# Patient Record
Sex: Female | Born: 1955 | Race: White | Hispanic: No | Smoking: Former smoker
Health system: Southern US, Community
[De-identification: ages and names within clinical notes are randomized; demographics above are authoritative.]

## PROBLEM LIST (undated history)

## (undated) DIAGNOSIS — I10 Essential (primary) hypertension: Secondary | ICD-10-CM

## (undated) DIAGNOSIS — Z9889 Other specified postprocedural states: Secondary | ICD-10-CM

## (undated) DIAGNOSIS — E119 Type 2 diabetes mellitus without complications: Secondary | ICD-10-CM

## (undated) DIAGNOSIS — J069 Acute upper respiratory infection, unspecified: Secondary | ICD-10-CM

## (undated) DIAGNOSIS — Z87442 Personal history of urinary calculi: Secondary | ICD-10-CM

## (undated) DIAGNOSIS — Z952 Presence of prosthetic heart valve: Secondary | ICD-10-CM

## (undated) DIAGNOSIS — R112 Nausea with vomiting, unspecified: Secondary | ICD-10-CM

## (undated) DIAGNOSIS — Z8774 Personal history of (corrected) congenital malformations of heart and circulatory system: Secondary | ICD-10-CM

## (undated) DIAGNOSIS — Z8719 Personal history of other diseases of the digestive system: Secondary | ICD-10-CM

## (undated) DIAGNOSIS — H409 Unspecified glaucoma: Secondary | ICD-10-CM

## (undated) DIAGNOSIS — I352 Nonrheumatic aortic (valve) stenosis with insufficiency: Secondary | ICD-10-CM

## (undated) DIAGNOSIS — I5032 Chronic diastolic (congestive) heart failure: Secondary | ICD-10-CM

## (undated) HISTORY — DX: Essential (primary) hypertension: I10

## (undated) HISTORY — DX: Other specified postprocedural states: Z98.890

## (undated) HISTORY — DX: Type 2 diabetes mellitus without complications: E11.9

## (undated) HISTORY — PX: CARDIAC CATHETERIZATION: SHX172

---

## 2003-11-02 DIAGNOSIS — Q231 Congenital insufficiency of aortic valve: Secondary | ICD-10-CM

## 2003-11-02 DIAGNOSIS — Q2381 Bicuspid aortic valve: Secondary | ICD-10-CM

## 2003-11-02 HISTORY — DX: Congenital insufficiency of aortic valve: Q23.1

## 2003-11-02 HISTORY — DX: Bicuspid aortic valve: Q23.81

## 2004-09-04 ENCOUNTER — Ambulatory Visit: Payer: Self-pay | Admitting: Cardiology

## 2004-09-09 ENCOUNTER — Ambulatory Visit (HOSPITAL_COMMUNITY): Admission: RE | Admit: 2004-09-09 | Discharge: 2004-09-09 | Payer: Self-pay | Admitting: Orthopedic Surgery

## 2004-09-09 ENCOUNTER — Ambulatory Visit: Payer: Self-pay | Admitting: Cardiology

## 2004-09-28 ENCOUNTER — Ambulatory Visit: Payer: Self-pay | Admitting: Cardiology

## 2009-10-11 ENCOUNTER — Encounter: Payer: Self-pay | Admitting: Cardiology

## 2012-01-07 ENCOUNTER — Other Ambulatory Visit: Payer: Self-pay | Admitting: Physician Assistant

## 2012-01-07 ENCOUNTER — Inpatient Hospital Stay (HOSPITAL_COMMUNITY)
Admission: AD | Admit: 2012-01-07 | Discharge: 2012-01-11 | DRG: 287 | Disposition: A | Discharge status: Home / Self Care | Payer: PRIVATE HEALTH INSURANCE | Source: Other Acute Inpatient Hospital | Attending: Cardiovascular Disease | Admitting: Cardiovascular Disease

## 2012-01-07 ENCOUNTER — Encounter (HOSPITAL_COMMUNITY)
Admission: AD | Disposition: A | Discharge status: Home / Self Care | Payer: Self-pay | Source: Other Acute Inpatient Hospital | Attending: Cardiovascular Disease

## 2012-01-07 ENCOUNTER — Encounter (HOSPITAL_COMMUNITY): Payer: Self-pay | Admitting: General Practice

## 2012-01-07 ENCOUNTER — Ambulatory Visit (HOSPITAL_COMMUNITY)
Admission: AD | Admit: 2012-01-07 | Payer: PRIVATE HEALTH INSURANCE | Source: Other Acute Inpatient Hospital | Admitting: Cardiovascular Disease

## 2012-01-07 DIAGNOSIS — Z888 Allergy status to other drugs, medicaments and biological substances status: Secondary | ICD-10-CM

## 2012-01-07 DIAGNOSIS — I359 Nonrheumatic aortic valve disorder, unspecified: Principal | ICD-10-CM

## 2012-01-07 DIAGNOSIS — Z79899 Other long term (current) drug therapy: Secondary | ICD-10-CM

## 2012-01-07 DIAGNOSIS — I5032 Chronic diastolic (congestive) heart failure: Secondary | ICD-10-CM | POA: Diagnosis present

## 2012-01-07 DIAGNOSIS — I509 Heart failure, unspecified: Secondary | ICD-10-CM | POA: Diagnosis present

## 2012-01-07 DIAGNOSIS — Q2111 Secundum atrial septal defect: Secondary | ICD-10-CM

## 2012-01-07 DIAGNOSIS — K0530 Chronic periodontitis, unspecified: Secondary | ICD-10-CM | POA: Diagnosis present

## 2012-01-07 DIAGNOSIS — I350 Nonrheumatic aortic (valve) stenosis: Secondary | ICD-10-CM

## 2012-01-07 DIAGNOSIS — IMO0002 Reserved for concepts with insufficient information to code with codable children: Secondary | ICD-10-CM | POA: Diagnosis present

## 2012-01-07 DIAGNOSIS — Z91010 Allergy to peanuts: Secondary | ICD-10-CM

## 2012-01-07 DIAGNOSIS — I517 Cardiomegaly: Secondary | ICD-10-CM | POA: Diagnosis present

## 2012-01-07 DIAGNOSIS — I10 Essential (primary) hypertension: Secondary | ICD-10-CM

## 2012-01-07 DIAGNOSIS — Z7982 Long term (current) use of aspirin: Secondary | ICD-10-CM

## 2012-01-07 DIAGNOSIS — F17200 Nicotine dependence, unspecified, uncomplicated: Secondary | ICD-10-CM | POA: Diagnosis present

## 2012-01-07 DIAGNOSIS — R079 Chest pain, unspecified: Secondary | ICD-10-CM

## 2012-01-07 DIAGNOSIS — Q211 Atrial septal defect: Secondary | ICD-10-CM

## 2012-01-07 DIAGNOSIS — Z720 Tobacco use: Secondary | ICD-10-CM

## 2012-01-07 HISTORY — DX: Personal history of other diseases of the digestive system: Z87.19

## 2012-01-07 HISTORY — DX: Chronic diastolic (congestive) heart failure: I50.32

## 2012-01-07 HISTORY — DX: Acute upper respiratory infection, unspecified: J06.9

## 2012-01-07 HISTORY — PX: LEFT AND RIGHT HEART CATHETERIZATION WITH CORONARY ANGIOGRAM: SHX5449

## 2012-01-07 HISTORY — DX: Nonrheumatic aortic (valve) stenosis with insufficiency: I35.2

## 2012-01-07 LAB — POCT I-STAT 3, VENOUS BLOOD GAS (G3P V)
Acid-base deficit: 1 mmol/L (ref 0.0–2.0)
pH, Ven: 7.345 — ABNORMAL HIGH (ref 7.250–7.300)

## 2012-01-07 LAB — POCT I-STAT 3, ART BLOOD GAS (G3+)
Acid-base deficit: 1 mmol/L (ref 0.0–2.0)
O2 Saturation: 97 %
TCO2: 25 mmol/L (ref 0–100)

## 2012-01-07 LAB — POCT ACTIVATED CLOTTING TIME
Activated Clotting Time: 0 seconds
Activated Clotting Time: 116 seconds

## 2012-01-07 SURGERY — LEFT AND RIGHT HEART CATHETERIZATION WITH CORONARY ANGIOGRAM
Anesthesia: LOCAL

## 2012-01-07 MED ORDER — DIAZEPAM 5 MG PO TABS: 5.0000 mg | ORAL_TABLET | ORAL | Status: DC

## 2012-01-07 MED ORDER — MIDAZOLAM HCL 2 MG/2ML IJ SOLN
INTRAMUSCULAR | Status: AC
  Filled 2012-01-07: qty 2

## 2012-01-07 MED ORDER — ACETAMINOPHEN 325 MG PO TABS: 650.0000 mg | ORAL_TABLET | ORAL | Status: DC | PRN

## 2012-01-07 MED ORDER — ASPIRIN EC 81 MG PO TBEC: 81.0000 mg | DELAYED_RELEASE_TABLET | Freq: Every day | ORAL | Status: DC

## 2012-01-07 MED ORDER — LIDOCAINE HCL (PF) 1 % IJ SOLN
INTRAMUSCULAR | Status: AC
  Filled 2012-01-07: qty 30

## 2012-01-07 MED ORDER — DIAZEPAM 2 MG PO TABS: 2.0000 mg | ORAL_TABLET | ORAL | Status: DC | PRN

## 2012-01-07 MED ORDER — ONDANSETRON HCL 4 MG/2ML IJ SOLN: 4.0000 mg | Freq: Four times a day (QID) | INTRAMUSCULAR | Status: DC | PRN

## 2012-01-07 MED ORDER — NITROGLYCERIN 0.2 MG/ML ON CALL CATH LAB
INTRAVENOUS | Status: AC
  Filled 2012-01-07: qty 1

## 2012-01-07 MED ORDER — ASPIRIN 81 MG PO CHEW
324.0000 mg | CHEWABLE_TABLET | ORAL | Status: AC
  Administered 2012-01-07: 324 mg via ORAL
  Filled 2012-01-07: qty 4

## 2012-01-07 MED ORDER — HEPARIN (PORCINE) IN NACL 2-0.9 UNIT/ML-% IJ SOLN
INTRAMUSCULAR | Status: AC
  Filled 2012-01-07: qty 2000

## 2012-01-07 MED ORDER — ASPIRIN 300 MG RE SUPP
300.0000 mg | RECTAL | Status: AC
  Filled 2012-01-07: qty 1

## 2012-01-07 MED ORDER — ASPIRIN EC 325 MG PO TBEC
325.0000 mg | DELAYED_RELEASE_TABLET | Freq: Every day | ORAL | Status: DC
  Administered 2012-01-08 – 2012-01-11 (×4): 325 mg via ORAL
  Filled 2012-01-07 (×4): qty 1

## 2012-01-07 MED ORDER — OXYCODONE-ACETAMINOPHEN 5-325 MG PO TABS
1.0000 | ORAL_TABLET | ORAL | Status: DC | PRN
  Administered 2012-01-07: 2 via ORAL
  Filled 2012-01-07: qty 2

## 2012-01-07 MED ORDER — DIAZEPAM 5 MG PO TABS
ORAL_TABLET | ORAL | Status: AC
  Filled 2012-01-07: qty 1

## 2012-01-07 MED ORDER — SODIUM CHLORIDE 0.9 % IJ SOLN: 3.0000 mL | INTRAMUSCULAR | Status: DC | PRN

## 2012-01-07 MED ORDER — SODIUM CHLORIDE 0.9 % IV SOLN: INTRAVENOUS | Status: DC

## 2012-01-07 MED ORDER — ONDANSETRON HCL 4 MG/2ML IJ SOLN
4.0000 mg | Freq: Four times a day (QID) | INTRAMUSCULAR | Status: DC | PRN
  Administered 2012-01-08: 4 mg via INTRAVENOUS
  Filled 2012-01-07 (×2): qty 2

## 2012-01-07 MED ORDER — NICOTINE 21 MG/24HR TD PT24
21.0000 mg | MEDICATED_PATCH | Freq: Every day | TRANSDERMAL | Status: DC
  Administered 2012-01-07 – 2012-01-11 (×5): 21 mg via TRANSDERMAL
  Filled 2012-01-07 (×5): qty 1

## 2012-01-07 MED ORDER — NITROGLYCERIN 0.4 MG SL SUBL
0.4000 mg | SUBLINGUAL_TABLET | SUBLINGUAL | Status: DC | PRN
  Administered 2012-01-11: 0.4 mg via SUBLINGUAL

## 2012-01-07 MED ORDER — SODIUM CHLORIDE 0.9 % IV SOLN: 250.0000 mL | INTRAVENOUS | Status: DC | PRN

## 2012-01-07 MED ORDER — SODIUM CHLORIDE 0.9 % IV SOLN
INTRAVENOUS | Status: DC
  Administered 2012-01-07: 14:00:00 via INTRAVENOUS

## 2012-01-07 MED ORDER — ASPIRIN 81 MG PO CHEW: 324.0000 mg | CHEWABLE_TABLET | ORAL | Status: DC

## 2012-01-07 MED ORDER — SODIUM CHLORIDE 0.9 % IJ SOLN: 3.0000 mL | Freq: Two times a day (BID) | INTRAMUSCULAR | Status: DC

## 2012-01-07 MED ORDER — SODIUM CHLORIDE 0.9 % IV SOLN: 1.0000 mL/kg/h | INTRAVENOUS | Status: DC

## 2012-01-07 MED ORDER — AMIODARONE HCL 150 MG/3ML IV SOLN
INTRAVENOUS | Status: AC
  Filled 2012-01-07: qty 3

## 2012-01-07 NOTE — Op Note (Signed)
Catheterization  Indication: Chest Pain AV disease  Procedure: After informed consent and clinical "time out" the right groin was prepped and draped in a sterile fashion.  A 6Fr sheath was placed in the right femoral artery using seldinger technique and local lidocaine.  A 7 French sheath was placed in the right femoral vein.  Standard JL4, JR4 and angled pigtail catheters were used to engage the coronary arteries.  Coronary arteries were visualized in orthogonal views using caudal and cranial angulation.  We were unable to cross the AV using multiple wires and pigtail catheters.  A singe LAO aortogram was done to assess for AR and root size.  Medications:   Versed: 2 mg's  Fentanyl: 0 ug's  Coronary Arteries: Left  dominant with no anomalies  LM: normal  LAD:  Normal   D1: normal  D2 normal  Circumflex:  Dominant and normal   OM1: normal  OM2: normal  PLB:  3 of them normal  PDA: normal  RCA:  normal    Ventriculography: EF: 60 %, no RWMA;s as seen after aortic root injection with severe AR  Aortography:  Severe AR and limited motion of AV.  Aortic root is calcified and dilated  Hemodynamics:  Aortic Pressure: 129 74 mmHg  MRA:  2 mmHg  RV: 24/3 mmHg  PA: 19/4 mean 12 mmHg  PCWP:  Mean 12 mmHg  CO: 4.94 L/Min CI: 2.74 L/Min/M2  Impression:  Echo data from [place] shows moderate to severe AS and moderate to severe AR.  Angio shows severe AR.  I think she needs her AV replaced.   Would do TEE on Monday.  Also would do TAVR protocol chest CT to plan AVR with Dr Cornelius Moras.  Do not order regular CT this weekend.  I will arrange CT for Monday Depending on timing of TEE.  Will call Dr Cornelius Moras for consult.  Patient tolerated the procedure well  Charlton Haws 01/07/2012 3:52 PM

## 2012-01-07 NOTE — H&P (Signed)
  H & P done at [facility] hospital by Dr Doyne Keel and Consult by Dr Kirke Corin Both reviewed.  Will be scanned into chart on D/C  Charlton Haws 3:24 PM 01/07/2012

## 2012-01-07 NOTE — Brief Op Note (Signed)
See operative ote Charlton Haws

## 2012-01-08 DIAGNOSIS — I359 Nonrheumatic aortic valve disorder, unspecified: Principal | ICD-10-CM

## 2012-01-08 LAB — URINALYSIS, ROUTINE W REFLEX MICROSCOPIC
Bilirubin Urine: NEGATIVE
Hgb urine dipstick: NEGATIVE
Ketones, ur: NEGATIVE mg/dL
Nitrite: NEGATIVE
pH: 6.5 (ref 5.0–8.0)

## 2012-01-08 LAB — CBC
HCT: 40.5 % (ref 36.0–46.0)
Hemoglobin: 13.5 g/dL (ref 12.0–15.0)
MCHC: 33.3 g/dL (ref 30.0–36.0)
MCV: 91.2 fL (ref 78.0–100.0)
RDW: 12.9 % (ref 11.5–15.5)
WBC: 6.9 10*3/uL (ref 4.0–10.5)

## 2012-01-08 LAB — BASIC METABOLIC PANEL
BUN: 19 mg/dL (ref 6–23)
Chloride: 104 mEq/L (ref 96–112)
Creatinine, Ser: 0.77 mg/dL (ref 0.50–1.10)
GFR calc Af Amer: >90 mL/min (ref >90)
Glucose, Bld: 112 mg/dL — ABNORMAL HIGH (ref 70–99)

## 2012-01-08 MED ORDER — CHLORHEXIDINE GLUCONATE 0.12 % MT SOLN
5.0000 mL | Freq: Two times a day (BID) | OROMUCOSAL | Status: DC
  Administered 2012-01-08 – 2012-01-09 (×4): 5 mL via OROMUCOSAL
  Filled 2012-01-08 (×6): qty 15

## 2012-01-08 NOTE — Consult Note (Signed)
CARDIOTHORACIC SURGERY CONSULTATION REPORT  PCP is TAPPER,DAVID B, MD, MD Referring Provider is Charlton Haws, MD   Reason for consultation:  Severe aortic stenosis  HPI:  [name] from [facility] with known history of aortic stenosis discovered in 2005 at which time she underwent both TEE and cardiac catheterization for atypical chest pain.  She was found to have moderate aortic stenosis and moderate aortic regurgitation without significant coronary artery disease. She has not been seen by a cardiologist again until recently. She describes a several month history of progressive exertional shortness of breath. She is recently began to develop increasing symptoms of atypical chest pain described as a constant tightness across her left chest with intermittent episodes of sharp pain across her left chest that did not seem to necessarily be related to physical activity. The patient also reports some occasional dizzy spells without syncope. Exertional shortness of breath has progressed and she now occasionally get short of breath with minimal activity or at rest. She denies PND, orthopnea, or lower extremity edema. She was admitted to [facility] in [place] and underwent transthoracic echocardiogram that reportedly demonstrates severe aortic stenosis with moderate aortic insufficiency and preserved left ventricular function. She was transferred to [facility], where she underwent cardiac catheterization yesterday demonstrating normal coronary artery anatomy with no significant coronary artery disease. Her thoracic surgical consultation has been requested.  Past Medical History  Diagnosis Date  . Aortic stenosis   . Heart murmur   . Shortness of breath   . Recurrent upper respiratory infection (URI)   . Headache   . H/O hiatal hernia     Past Surgical History  Procedure Date  . Cardiac catheterization     History reviewed. No pertinent family  history.  Social History History  Substance Use Topics  . Smoking status: Current Everyday Smoker -- 1.0 packs/day for 40 years    Types: Cigarettes  . Smokeless tobacco: Never Used  . Alcohol Use: No    Prior to Admission medications   Medication Sig Start Date End Date Taking? Authorizing Provider  loratadine (CLARITIN) 10 MG tablet Take 10 mg by mouth daily as needed. For allergy   Yes Historical Provider, MD    Current Facility-Administered Medications  Medication Dose Route Frequency Provider Last Rate Last Dose  . acetaminophen (TYLENOL) tablet 650 mg  650 mg Oral Q4H PRN Wendall Stade, MD      . aspirin chewable tablet 324 mg  324 mg Oral NOW Rande Brunt, PA   324 mg at 01/07/12 1427   Or  . aspirin suppository 300 mg  300 mg Rectal NOW Rande Brunt, PA      . aspirin EC tablet 325 mg  325 mg Oral Daily Wendall Stade, MD   325 mg at 01/08/12 1100  . chlorhexidine (PERIDEX) 0.12 % solution 5 mL  5 mL Mouth/Throat BID Purcell Nails, MD      . diazepam (VALIUM) tablet 2 mg  2 mg Oral Q4H PRN Wendall Stade, MD      . heparin 2-0.9 UNIT/ML-% infusion           . lidocaine (XYLOCAINE) 1 % injection           . midazolam (VERSED) 2 MG/2ML injection           . nicotine (NICODERM CQ - dosed in mg/24 hours) patch 21 mg  21 mg  Transdermal Daily Rande Brunt, PA   21 mg at 01/08/12 1056  . nitroGLYCERIN (NITROSTAT) SL tablet 0.4 mg  0.4 mg Sublingual Q5 Min x 3 PRN Rande Brunt, PA      . nitroGLYCERIN (NTG ON-CALL) 0.2 mg/mL injection           . ondansetron (ZOFRAN) injection 4 mg  4 mg Intravenous Q6H PRN Wendall Stade, MD   4 mg at 01/08/12 0428  . oxyCODONE-acetaminophen (PERCOCET) 5-325 MG per tablet 1-2 tablet  1-2 tablet Oral Q4H PRN Wendall Stade, MD   2 tablet at 01/07/12 2149  . DISCONTD: 0.9 %  sodium chloride infusion   Intravenous Continuous Rande Brunt, PA 75 mL/hr at 01/07/12 1425    . DISCONTD: 0.9 %  sodium chloride infusion  250 mL Intravenous PRN  Rande Brunt, PA      . DISCONTD: 0.9 %  sodium chloride infusion   Intravenous Continuous Rande Brunt, PA      . DISCONTD: 0.9 %  sodium chloride infusion  1 mL/kg/hr Intravenous Continuous Wendall Stade, MD 71.2 mL/hr at 01/07/12 1718 1 mL/kg/hr at 01/07/12 1718  . DISCONTD: acetaminophen (TYLENOL) tablet 650 mg  650 mg Oral Q4H PRN Rande Brunt, PA      . DISCONTD: amiodarone (CORDARONE) 150 MG/3ML injection           . DISCONTD: aspirin chewable tablet 324 mg  324 mg Oral Pre-Cath Rande Brunt, PA      . DISCONTD: aspirin EC tablet 81 mg  81 mg Oral Daily Rande Brunt, PA      . DISCONTD: diazepam (VALIUM) tablet 5 mg  5 mg Oral On Call Rande Brunt, PA      . DISCONTD: diazepam (VALIUM) tablet 5 mg  5 mg Oral On Call [name], MD      . DISCONTD: ondansetron [facility]) injection 4 mg  4 mg Intravenous Q6H PRN Rande Brunt, PA      . DISCONTD: sodium chloride 0.9 % injection 3 mL  3 mL Intravenous Q12H Rande Brunt, PA      . DISCONTD: sodium chloride 0.9 % injection 3 mL  3 mL Intravenous PRN Rande Brunt, PA        Allergies  Allergen Reactions  . Wellbutrin (Bupropion Hcl)     itching    Review of Systems:  General:  normal appetite, normal energy, no recent weight gain nor weight loss  Respiratory:  no cough, no wheezing, no hemoptysis, no pain with inspiration or cough, + shortness of breath, + smoking  Cardiac:   + chest pain or tightness, + exertional SOB, no resting SOB, no PND, no orthopnea, no LE edema, no palpitations, no syncope, + dizzy spells  GI:   no difficulty swallowing, no hematochezia, no hematemesis, no melena, no constipation, no diarrhea   GU:   no dysuria, no urgency, no frequency   Musculoskeletal: no arthritis, no arthralgia   Vascular:  no pain suggestive of claudication   Neuro:   no symptoms suggestive of TIA's, no seizures, no headaches, no peripheral neuropathy   Endocrine:  Negative   HEENT:  no loose teeth or painful teeth,  hasn't seen a dentist in several years, partial lower plate dentures, no recent vision changes  Psych:   no anxiety, no depression    Physical Exam:   BP 142/88  Pulse 77  Temp(Src) 97.8 F (36.6 C) (Oral)  Resp 18  Ht 5' 6.5" (1.689 m)  Wt 71.215 kg (157 lb)  BMI 24.96 kg/m2  SpO2 98%  General:  well-appearing  HEENT:  Unremarkable   Neck:   no JVD, no bruits, no adenopathy   Chest:   clear to auscultation, symmetrical breath sounds, no wheezes, no rhonchi   CV:   RRR, grade III/VI systolic murmur   Abdomen:  soft, non-tender, no masses   Extremities:  warm, well-perfused, pulses palpable  Rectal/GU  Deferred  Neuro:   Grossly non-focal and symmetrical throughout  Skin:   Clean and dry, no rashes, no breakdown  Diagnostic Tests:  Report of 2D echocardiogram performed at [facility] reviewed (films not currently available) notable for severe aortic stenosis with peak flow of severe across the aortic valve measured 4.05 cm/s and peak and mean transvalvular gradients estimated 66 and 38 mm mercury respectively. Left ventricular output tract diameter was measured 19 mm. Aortic valve was notably heavily calcified and sclerotic with severely restricted leaflet mobility. Aortic valve looked to be likely bicuspid. There was moderate to severe aortic insufficiency. Left ventricular systolic function was normal with ejection fraction estimated 55-60%. There was mild left ventricular hypertrophy and mild mitral regurgitation.   Cardiac Catheterization Report   Coronary Arteries:  Left dominant with no anomalies  LM: normal   LAD: Normal  D1: normal  D2 normal  Circumflex: Dominant and normal  OM1: normal  OM2: normal  PLB: 3 of them normal  PDA: normal  RCA: normal  Ventriculography: EF: 60 %, no RWMA;s as seen after aortic root injection with severe AR  Aortography: Severe AR and limited motion of AV. 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 The patient has normal left ventricular function and no significant coronary artery disease.  We await results of transesophageal echocardiogram and CT angiogram planned to further evaluate the functional anatomy of her aortic valve disease and to better ascertain whether or not she might be considered candidate for minimally invasive approach for surgery. She will need dental consultation.  Plan:  The patient was counseled at length regarding surgical alternatives with respect to valve replacement. In particular, discussion was held comparing in contrast and the risks of mechanical valve replacement and the need for lifelong anticoagulation versus use of a bioprosthetic tissue valve and the associated potential for late structural valve deterioration in failure. Other alternatives including the Ross autograft procedure, homograft aortic root replacement, stentless bioprosthetic tissue valve replacement, and valve repair were discussed.  This discussion was placed in the context of the patient's particular circumstances, and as a result the patient specifically requests that their valve be replaced using some type of tissue valve in order to avoid coumadin therapy.  They understand and accept all potential associated risks of surgery including but not limited to risk of death, stroke, myocardial infarction, congestive heart failure, respiratory failure, renal failure, pneumonia, bleeding requiring blood transfusion and or reexploration, arrhythmia, heart block or bradycardia requiring permanent pacemaker, pleural effusions or other delayed complications related to continued congestive heart failure, or other late  complications related to valve replacement.     Salvatore Decent. Cornelius Moras,  MD 01/08/2012 12:37 PM

## 2012-01-08 NOTE — Progress Notes (Signed)
Patient ID: Courtney Harvey, female   DOB: 12-16-1955, 56 y.o.   MRN: [id number] Subjective:  Denies chest pain or sob.  Objective:  Vital Signs in the last 24 hours: Temp:  [97.5 F (36.4 C)-97.9 F (36.6 C)] 97.8 F (36.6 C) (03/09 0500) Pulse Rate:  [73-87] 77  (03/09 0500) Resp:  [16-18] 18  (03/09 0500) BP: (126-142)/(62-88) 142/88 mmHg (03/09 0500) SpO2:  [96 %-98 %] 98 % (03/09 0500) Weight:  [71.215 kg (157 lb)] 71.215 kg (157 lb) (03/08 1358)  Intake/Output from previous day: 03/08 0701 - 03/09 0700 In: 240 [P.O.:240] Out: -  Intake/Output from this shift:    Physical Exam: Well appearing NAD HEENT: Unremarkable Neck:  No JVD, no thyromegally Lymphatics:  No adenopathy Back:  No CVA tenderness Lungs:  Clear with no wheezes. HEART:  Regular rate rhythm, with AS/AI murmurs, no rubs, no clicks Abd:  Flat, positive bowel sounds, no organomegally, no rebound, no guarding Ext:  2 plus pulses, no edema, no cyanosis, no clubbing Skin:  No rashes no nodules Neuro:  CN II through XII intact, motor grossly intact  Lab Results:  Basename 01/08/12 0530  WBC 6.9  HGB 13.5  PLT 199    Basename 01/08/12 0530  NA 138  K 3.8  CL 104  CO2 26  GLUCOSE 112*  BUN 19  CREATININE 0.77   No results found for this basename: TROPONINI:2,CK,MB:2 in the last 72 hours Hepatic Function Panel No results found for this basename: PROT,ALBUMIN,AST,ALT,ALKPHOS,BILITOT,BILIDIR,IBILI in the last 72 hours No results found for this basename: CHOL in the last 72 hours No results found for this basename: PROTIME in the last 72 hours  Imaging: No results found.  Cardiac Studies: Tele - NSR Assessment/Plan:  1. AI/AS - see Dr. Fabio Bering note and plan for TEE and CT scan of the heart. 2. Diastolic heart failure - She is fairly well compensated. She will continue her current meds.  LOS: 1 day    Lewayne Bunting 01/08/2012, 11:30 AM

## 2012-01-09 ENCOUNTER — Inpatient Hospital Stay (HOSPITAL_COMMUNITY): Payer: PRIVATE HEALTH INSURANCE

## 2012-01-09 DIAGNOSIS — Z01810 Encounter for preprocedural cardiovascular examination: Secondary | ICD-10-CM

## 2012-01-09 LAB — COMPREHENSIVE METABOLIC PANEL
AST: 13 U/L (ref 0–37)
Albumin: 3.4 g/dL — ABNORMAL LOW (ref 3.5–5.2)
BUN: 17 mg/dL (ref 6–23)
Calcium: 9.5 mg/dL (ref 8.4–10.5)
Creatinine, Ser: 0.79 mg/dL (ref 0.50–1.10)
Total Protein: 6.8 g/dL (ref 6.0–8.3)

## 2012-01-09 LAB — BLOOD GAS, ARTERIAL
Acid-Base Excess: 1 mmol/L (ref 0.0–2.0)
Drawn by: 308601
TCO2: 26.5 mmol/L (ref 0–100)
pCO2 arterial: 41 mmHg (ref 35.0–45.0)
pH, Arterial: 7.405 — ABNORMAL HIGH (ref 7.350–7.400)
pO2, Arterial: 83.7 mmHg (ref 80.0–100.0)

## 2012-01-09 LAB — PROTIME-INR
INR: 1.02 (ref 0.00–1.49)
Prothrombin Time: 13.6 seconds (ref 11.6–15.2)

## 2012-01-09 LAB — CBC
Hemoglobin: 13.6 g/dL (ref 12.0–15.0)
MCH: 31 pg (ref 26.0–34.0)
MCV: 90.9 fL (ref 78.0–100.0)
RBC: 4.39 MIL/uL (ref 3.87–5.11)
WBC: 6 10*3/uL (ref 4.0–10.5)

## 2012-01-09 LAB — TYPE AND SCREEN: Antibody Screen: NEGATIVE

## 2012-01-09 LAB — TSH: TSH: 3.201 u[IU]/mL (ref 0.350–4.500)

## 2012-01-09 LAB — APTT: aPTT: 31 seconds (ref 24–37)

## 2012-01-09 MED ORDER — MAGNESIUM HYDROXIDE 400 MG/5ML PO SUSP: 30.0000 mL | ORAL | Status: DC | PRN

## 2012-01-09 NOTE — Progress Notes (Signed)
Patient ID: Courtney Harvey, female   DOB: Aug 16, 1956, 56 y.o.   MRN: [id number]  Subjective:  Denies chest pain or sob.  Objective:  Vital Signs in the last 24 hours: Temp:  [97.8 F (36.6 C)-98.3 F (36.8 C)] 97.8 F (36.6 C) (03/10 0535) Pulse Rate:  [61] 61  (03/10 0535) Resp:  [18] 18  (03/10 0535) BP: (123-128)/(67-70) 128/67 mmHg (03/10 0535) SpO2:  [96 %-97 %] 96 % (03/10 0535)  Intake/Output from previous day:   Intake/Output from this shift:    Physical Exam: Well appearing NAD HEENT: Unremarkable Neck:  No JVD, no thyromegally Lymphatics:  No adenopathy Back:  No CVA tenderness Lungs:  Clear with no wheezes. HEART:  Regular rate rhythm, with AS/AI murmurs, no rubs, no clicks Abd:  Flat, positive bowel sounds, no organomegally, no rebound, no guarding Ext:  2 plus pulses, no edema, no cyanosis, no clubbing Skin:  No rashes no nodules Neuro:  CN II through XII intact, motor grossly intact  Lab Results:  Basename 01/09/12 0640 01/08/12 0530  WBC 6.0 6.9  HGB 13.6 13.5  PLT 186 199    Basename 01/09/12 0640 01/08/12 0530  NA 140 138  K 4.1 3.8  CL 104 104  CO2 27 26  GLUCOSE 104* 112*  BUN 17 19  CREATININE 0.79 0.77   No results found for this basename: TROPONINI:2,CK,MB:2 in the last 72 hours Hepatic Function Panel  Basename 01/09/12 0640  PROT 6.8  ALBUMIN 3.4*  AST 13  ALT 14  ALKPHOS 78  BILITOT 0.3  BILIDIR --  IBILI --   No results found for this basename: CHOL in the last 72 hours No results found for this basename: PROTIME in the last 72 hours  Imaging: No results found.  Cardiac Studies: Tele - NSR Assessment/Plan:  1. AI/AS - see Dr. Fabio Bering and Owen's note and plan for TEE and CT scan of the heart followed by valve replacement. Will make NPO for possible TEE/CT. 2. Diastolic heart failure - She is fairly well compensated. She will continue her current meds.  LOS: 2 days    Lewayne Bunting 01/09/2012, 11:15 AM

## 2012-01-09 NOTE — Progress Notes (Addendum)
Pre-op Cardiac Surgery  Carotid Findings:  No ICA stenosis.  Vertebral artery flow is antegrade.  Upper Extremity Right Left  Brachial Pressures 120 Triphasic 114 Triphasic  Radial Waveforms Triphasic Triphasic  Ulnar Waveforms Triphasic Triphasic  Palmar Arch (Allen's Test) Normal Normal   Findings:  Doppler waveforms remained normal bilaterally with both radial and ulnar compressions.

## 2012-01-10 ENCOUNTER — Encounter (HOSPITAL_COMMUNITY): Payer: Self-pay

## 2012-01-10 ENCOUNTER — Other Ambulatory Visit: Payer: Self-pay

## 2012-01-10 ENCOUNTER — Encounter (HOSPITAL_COMMUNITY)
Admission: AD | Disposition: A | Discharge status: Home / Self Care | Payer: Self-pay | Source: Other Acute Inpatient Hospital | Attending: Cardiovascular Disease

## 2012-01-10 DIAGNOSIS — I359 Nonrheumatic aortic valve disorder, unspecified: Secondary | ICD-10-CM

## 2012-01-10 DIAGNOSIS — Z954 Presence of other heart-valve replacement: Secondary | ICD-10-CM

## 2012-01-10 HISTORY — PX: TEE WITHOUT CARDIOVERSION: SHX5443

## 2012-01-10 LAB — SURGICAL PCR SCREEN: Staphylococcus aureus: NEGATIVE

## 2012-01-10 SURGERY — ECHOCARDIOGRAM, TRANSESOPHAGEAL
Anesthesia: Moderate Sedation

## 2012-01-10 MED ORDER — FENTANYL CITRATE 0.05 MG/ML IJ SOLN
INTRAMUSCULAR | Status: DC | PRN
  Administered 2012-01-10 (×3): 25 ug via INTRAVENOUS

## 2012-01-10 MED ORDER — MENTHOL 3 MG MT LOZG
1.0000 | LOZENGE | OROMUCOSAL | Status: DC | PRN
  Administered 2012-01-10: 3 mg via ORAL
  Filled 2012-01-10: qty 9

## 2012-01-10 MED ORDER — LORATADINE 10 MG PO TABS
10.0000 mg | ORAL_TABLET | Freq: Every day | ORAL | Status: DC | PRN
  Administered 2012-01-10: 10 mg via ORAL
  Filled 2012-01-10: qty 1

## 2012-01-10 MED ORDER — FENTANYL CITRATE 0.05 MG/ML IJ SOLN
INTRAMUSCULAR | Status: AC
  Filled 2012-01-10: qty 2

## 2012-01-10 MED ORDER — BUTAMBEN-TETRACAINE-BENZOCAINE 2-2-14 % EX AERO
INHALATION_SPRAY | CUTANEOUS | Status: DC | PRN
  Administered 2012-01-10: 2 via TOPICAL

## 2012-01-10 MED ORDER — CHLORHEXIDINE GLUCONATE 0.12 % MT SOLN
10.0000 mL | Freq: Three times a day (TID) | OROMUCOSAL | Status: DC
  Administered 2012-01-10: 10 mL via OROMUCOSAL
  Filled 2012-01-10 (×4): qty 15

## 2012-01-10 MED ORDER — MIDAZOLAM HCL 10 MG/2ML IJ SOLN
INTRAMUSCULAR | Status: AC
  Filled 2012-01-10: qty 2

## 2012-01-10 MED ORDER — SODIUM CHLORIDE 0.45 % IV SOLN: INTRAVENOUS | Status: DC

## 2012-01-10 MED ORDER — MIDAZOLAM HCL 10 MG/2ML IJ SOLN
INTRAMUSCULAR | Status: DC | PRN
  Administered 2012-01-10 (×2): 2 mg via INTRAVENOUS
  Administered 2012-01-10: 1 mg via INTRAVENOUS
  Administered 2012-01-10 (×2): 2 mg via INTRAVENOUS

## 2012-01-10 NOTE — Progress Notes (Signed)
*  PRELIMINARY RESULTS* Echocardiogram Echocardiogram Transesophageal has been performed.  Katheren Puller 01/10/2012, 2:35 PM

## 2012-01-10 NOTE — Consult Note (Signed)
Pt is a 1 ppd smoker who is in action stage and wants to quit. She is interested in using patches. Recommended 21 mg patch x 6 weeks, 14 mg patch x 2 weeks and 7 mg patch x 2 weeks. Discussed patch use instructions and how to taper. Referred to 1-800 quit now for f/u and support. Discussed oral fixation substitutes, second hand smoke and in home smoking policy. Reviewed and gave pt Written education/contact information.

## 2012-01-10 NOTE — Progress Notes (Signed)
Patient ID: Courtney Harvey, female   DOB: 03/12/1956, 56 y.o.   MRN: [id number] @ Subjective:  Denies SSCP, palpitations or Dyspnea  Objective:  Filed Vitals:   01/09/12 0535 01/09/12 1400 01/09/12 2200 01/10/12 0600  BP: 128/67 116/72 135/76 156/82  Pulse: 61 78 87 84  Temp: 97.8 F (36.6 C) 97.4 F (36.3 C) 98 F (36.7 C) 97.7 F (36.5 C)  TempSrc:  Oral    Resp: 18 20 16 14  Height:      Weight:      SpO2: 96% 96% 94% 96%    Intake/Output from previous day:  Intake/Output Summary (Last 24 hours) at 01/10/12 0814 Last data filed at 01/09/12 1800  Gross per 24 hour  Intake      0 ml  Output      2 ml  Net     -2 ml    Physical Exam: Affect appropriate Healthy:  appears stated age HEENT: normal Neck supple with no adenopathy JVP normal no bruits no thyromegaly Lungs clear with no wheezing and good diaphragmatic motion Heart:  S1/S2 AS/AR  murmur, no rub, gallop or click PMI normal Abdomen: benighn, BS positve, no tenderness, no AAA no bruit.  No HSM or HJR Distal pulses intact with no bruits No edema Neuro non-focal Skin warm and dry No muscular weakness   Lab Results: Basic Metabolic Panel:  Basename 01/09/12 0640 01/08/12 0530  NA 140 138  K 4.1 3.8  CL 104 104  CO2 27 26  GLUCOSE 104* 112*  BUN 17 19  CREATININE 0.79 0.77  CALCIUM 9.5 9.0  MG -- --  PHOS -- --   Liver Function Tests:  Basename 01/09/12 0640  AST 13  ALT 14  ALKPHOS 78  BILITOT 0.3  PROT 6.8  ALBUMIN 3.4*   No results found for this basename: LIPASE:2,AMYLASE:2 in the last 72 hours CBC:  Basename 01/09/12 0640 01/08/12 0530  WBC 6.0 6.9  NEUTROABS -- --  HGB 13.6 13.5  HCT 39.9 40.5  MCV 90.9 91.2  PLT 186 199     Basename 01/09/12 0640  TSH 3.201  T4TOTAL --  T3FREE --  THYROIDAB --   Anemia Panel: No results found for this basename: VITAMINB12,FOLATE,FERRITIN,TIBC,IRON,RETICCTPCT in the last 72 hours   Cardiac Studies:  ECG:  NSR normal borderline  LVH   Telemetry: NSR no arrythmia  Echo:  [place] mod to severe AS and mod to severe AR  Medications:     . aspirin EC  325 mg Oral Daily  . chlorhexidine  5 mL Mouth/Throat BID  . nicotine  21 mg Transdermal Daily       . sodium chloride      Assessment/Plan:  AS/AR:  For TEE today.  CT for minimally invasive planning Tuesday.  ? Surgery Friday with Dr Owen Smoking:  Continue 21 mg patch  No CAD.    Pre TEE orders placed for Dr Ross  Gearld Kerstein 01/10/2012, 8:14 AM     

## 2012-01-10 NOTE — Op Note (Addendum)
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  There is calcification of the right and noncoronary cusps with partial fusion.  There appears to be moderately retricted motion.  There is moderate to severe AI.   MV is normal TV is normal PV is normal. LV function is normal Aortic root and ascending aorta are normal in size. Large PFO by 2D images and with injection of agitated saline.

## 2012-01-10 NOTE — Consult Note (Signed)
DENTAL CONSULTATION  Date of Consultation:  01/10/2012 Patient Name:   Courtney Harvey Date of Birth:   1956/10/07 Medical Record Number: [id number]  VITALS: BP 102/59  Pulse 69  Temp(Src) 98.5 F (36.9 C) (Oral)  Resp 17  Ht 5' 6.5" (1.689 m)  Wt 157 lb (71.215 kg)  BMI 24.96 kg/m2  SpO2 94%   Reason for consultation: Patient referred for a pre-heart valve surgery dental protocol evaluation.   HPI: Courtney Harvey is a 56 year old female referred by Dr. Tressie Stalker for dental consultation. Patient recently diagnosed with severe aortic stenosis with anticipated aortic valve replacement. Patient is now seen as part of a pre-heart valve surgery dental protocol evaluation to rule out dental infection that may affect the patient's systemic health and anticipated heart valve surgery.  Patient currently denies acute toothache, swellings, or abscesses. Patient was last seen approximately 5 years ago for an exam and cleaning. Patient was seen by Dr. Jearl Klinefelter in [place], [place]. Patient has a lower partial denture that was fabricated approximately 6-7 years ago. Patient denies having any problems with the lower partial denture    PMH: Past Medical History  Diagnosis Date  . Aortic stenosis   . Heart murmur   . Shortness of breath   . Recurrent upper respiratory infection (URI)   . Headache   . H/O hiatal hernia   . Dysrhythmia     PSH: Past Surgical History  Procedure Date  . Cardiac catheterization 2005 and 2013    Friday March 8th    ALLERGIES: Allergies  Allergen Reactions  . Peanut-Containing Drug Products Other (See Comments)    headache  . Wellbutrin (Bupropion Hcl)     itching    MEDICATIONS: Current Facility-Administered Medications  Medication Dose Route Frequency Provider Last Rate Last Dose  . acetaminophen (TYLENOL) tablet 650 mg  650 mg Oral Q4H PRN Wendall Stade, MD      . aspirin EC tablet 325 mg  325 mg Oral Daily Wendall Stade, MD   325 mg  at 01/10/12 0958  . chlorhexidine (PERIDEX) 0.12 % solution 5 mL  5 mL Mouth/Throat BID Purcell Nails, MD   5 mL at 01/09/12 2110  . diazepam (VALIUM) tablet 2 mg  2 mg Oral Q4H PRN Wendall Stade, MD      . loratadine (CLARITIN) tablet 10 mg  10 mg Oral Daily PRN Pricilla Riffle, MD   10 mg at 01/10/12 1541  . magnesium hydroxide (MILK OF MAGNESIA) suspension 30 mL  30 mL Oral PRN [name], MD      . menthol-cetylpyridinium (CEPACOL) lozenge 3 mg  1 lozenge Oral PRN Ok Anis, NP   3 mg at 01/10/12 1541  . nicotine (NICODERM CQ - dosed in mg/24 hours) patch 21 mg  21 mg Transdermal Daily Rande Brunt, PA   21 mg at 01/10/12 0958  . nitroGLYCERIN (NITROSTAT) SL tablet 0.4 mg  0.4 mg Sublingual Q5 Min x 3 PRN Rande Brunt, PA      . ondansetron [facility]) injection 4 mg  4 mg Intravenous Q6H PRN Wendall Stade, MD   4 mg at 01/08/12 0428  . oxyCODONE-acetaminophen (PERCOCET) 5-325 MG per tablet 1-2 tablet  1-2 tablet Oral Q4H PRN Wendall Stade, MD   2 tablet at 01/07/12 2149  . DISCONTD: 0.45 % sodium chloride infusion   Intravenous Continuous Wendall Stade, MD      . DISCONTD: butamben-tetracaine-benzocaine (CETACAINE) spray  PRN Pricilla Riffle, MD   2 spray at 01/10/12 1238  . DISCONTD: fentaNYL (SUBLIMAZE) injection    PRN Pricilla Riffle, MD   25 mcg at 01/10/12 1251  . DISCONTD: midazolam (VERSED) injection    PRN Pricilla Riffle, MD   2 mg at 01/10/12 1252    LABS: Lab Results  Component Value Date   WBC 6.0 01/09/2012   HGB 13.6 01/09/2012   HCT 39.9 01/09/2012   MCV 90.9 01/09/2012   PLT 186 01/09/2012      Component Value Date/Time   NA 140 01/09/2012 0640   K 4.1 01/09/2012 0640   CL 104 01/09/2012 0640   CO2 27 01/09/2012 0640   GLUCOSE 104* 01/09/2012 0640   BUN 17 01/09/2012 0640   CREATININE 0.79 01/09/2012 0640   CALCIUM 9.5 01/09/2012 0640   GFRNONAA >90 01/09/2012 0640   GFRAA >90 01/09/2012 0640   Lab Results  Component Value Date   INR 1.02 01/09/2012   No  results found for this basename: PTT    SOCIAL HISTORY: History   Social History  . Marital Status: Single    Spouse Name: N/A    Number of Children: N/A  . Years of Education: N/A   Occupational History  . Not on file.   Social History Main Topics  . Smoking status: Current Everyday Smoker -- 1.0 packs/day for 40 years    Types: Cigarettes  . Smokeless tobacco: Never Used  . Alcohol Use: No  . Drug Use: No  . Sexually Active: Not Currently    Birth Control/ Protection: Post-menopausal   Other Topics Concern  . Not on file   Social History Narrative  . No narrative on file    FAMILY HISTORY: History reviewed. No pertinent family history.   REVIEW OF SYSTEMS: Reviewed from Dr. Orvan July note for this admission.  DENTAL HISTORY: CC: Patient referred for a pre-heart valve surgery dental protocol evaluation.   HPI: Courtney Harvey is a 56 year old female referred by Dr. Tressie Stalker for dental consultation. Patient recently diagnosed with severe aortic stenosis with anticipated aortic valve replacement. Patient is now seen as part of a pre-heart valve surgery dental protocol evaluation to rule out dental infection that may affect the patient's systemic health and anticipated heart valve surgery.  Patient currently denies acute toothache, swellings, or abscesses. Patient was last seen approximately 5 years ago for an exam and cleaning. Patient was seen by Dr. Jearl Klinefelter in [place], [place]. Patient has a lower partial denture that was fabricated approximately 6-7 years ago. Patient denies having any problems with the lower partial denture   DENTAL EXAMINATION:  GENERAL:Patient is a well-developed, well-nourished female in no acute distress . HEAD AND NECK: There is no palpable submandibular lymphadenopathy. The patient denies acute TMJ symptoms. INTRAORAL EXAM: The patient has normal saliva. There is no evidence of intraoral abscess formation. DENTITION: Patient is  missing tooth numbers 1, 16, 17, 22, 23, 24, 25, and 26. PERIODONTAL: Patient with chronic periodontitis with plaque and calculus accumulations, selective areas of gingival recession and no significant tooth mobility at this time. DENTAL CARIES/SUBOPTIMAL RESTORATIONS:There are no obvious dental caries noted. Ideally the patient needs a full series of dental radiographs to rule out incipient dental caries. ENDODONTIC: Patient denies acute pulpitis symptoms. There is no radiographic evidence of periapical pathology or radiolucency. CROWN AND BRIDGE:There are no crown or bridge restorations noted. PROSTHODONTIC:Patient with a lower partial denture that has acceptable retention and stability. OCCLUSION: The  occlusion is stable at this time .  RADIOGRAPHIC INTERPRETATION:  a panoramic x-ray was taken on 01/09/2012. There are multiple missing teeth. There is moderate bone loss. There is radiographic calculus noted. No obvious dental caries noted. There is no evidence of periapical pathology or radiolucency.   ASSESSMENTS:  1. Chronic periodontitis with bone loss 2. Selective areas of gingival recession 3. Plaque and calculus accumulation 4. No clinically significant tooth mobility 5. Multiple missing teeth 6. Stable and retentive lower partial denture  7. Stable occlusion  8. Need for evaluation for antibiotic premedication prior to invasive dental procedures per American Heart Association Guidelines.    PLAN/RECOMMENDATIONS: 1. I discussed the risks, benefits, and complications of various treatment options with the patient in relationship to her medical and dental conditions. We discussed various treatment options to include no treatment, multiple extractions with alveoloplasty, pre-prosthetic surgery as indicated, periodontal therapy, dental restorations, root canal therapy, crown and bridge therapy, implant therapy, and replacement of missing teeth as indicated. The patient currently wishes to   defer all dental treatment at this time. Patient will then followup with her primary dentist (Dr. Jearl Klinefelter) for examination, radiographs, and periodontal therapy once medically stable from her anticipated heart valve surgery. Patient is aware that she will need antibiotic premedication prior to invasive dental procedures after the heart valve replacement procedure. Patient is currently cleared for heart valve surgery at this time    2. Discussion of findings with  Dr. Tressie Stalker and cardiology team and coordination of future medical and dental care as indicated.    Charlynne Pander, DDS

## 2012-01-10 NOTE — Progress Notes (Signed)
CARDIOTHORACIC SURGERY PROGRESS NOTE  Day of Surgery  S/P Procedure(s) (LRB): TRANSESOPHAGEAL ECHOCARDIOGRAM (TEE) (N/A)  Subjective: Feels well. No chest pain. No SOB.  Objective: Vital signs in last 24 hours: Temp:  [97.7 F (36.5 C)-98.5 F (36.9 C)] 98.5 F (36.9 C) (03/11 1209) Pulse Rate:  [64-90] 80  (03/11 1630) Cardiac Rhythm:  [-] Normal sinus rhythm (03/11 0800) Resp:  [14-28] 17  (03/11 1400) BP: (86-156)/(48-84) 130/62 mmHg (03/11 1630) SpO2:  [94 %-98 %] 94 % (03/11 1400)  Physical Exam:  Rhythm:   sinus  Breath sounds: clear  Heart sounds:  RRR with systolic murmur  Incisions:  n/a  Abdomen:  soft  Extremities:  warm   Intake/Output from previous day: 03/10 0701 - 03/11 0700 In: -  Out: 2 [Stool:2] Intake/Output this shift: Total I/O In: 500 [I.V.:500] Out: -   Lab Results:  Basename 01/09/12 0640 01/08/12 0530  WBC 6.0 6.9  HGB 13.6 13.5  HCT 39.9 40.5  PLT 186 199   BMET:  Basename 01/09/12 0640 01/08/12 0530  NA 140 138  K 4.1 3.8  CL 104 104  CO2 27 26  GLUCOSE 104* 112*  BUN 17 19  CREATININE 0.79 0.77  CALCIUM 9.5 9.0    CBG (last 3)  No results found for this basename: GLUCAP:3 in the last 72 hours PT/INR:   Basename 01/09/12 0640  LABPROT 13.6  INR 1.02    Diagnostic Studies:    CHEST - 2 VIEW 01/09/2012 Comparison: 01/06/2012.  Findings: The heart is borderline enlarged but stable. There is  mild tortuosity of the thoracic aorta. Suspect emphysematous  changes with apical scarring. No definite acute overlying  pulmonary process. Possible lower lobe bronchiectasis. No pleural  effusion. The bony thorax is intact.  IMPRESSION:  1. Suspect emphysematous changes and possible lower lobe  bronchiectasis.  2. No acute pulmonary findings.  Original Report Authenticated By: P. Loralie Champagne, M.D.  ORTHOPANTOGRAM/PANORAMIC  Comparison: None  Findings: Several missing lower teeth are identified.  There is no evidence  of periapical abscess or identifiable caries.  The bony structures are unremarkable.  IMPRESSION:  No evidence of periapical abscess or identifiable caries.  Original Report Authenticated By: Rosendo Gros, M.D.     Noninvasive Vascular Lab Preoperative Vascular Evaluation  Patient: Courtney Harvey, Courtney Harvey MR #: [id number] Study Date: 01/09/2012 Summary:  - No significant extracranial carotid artery stenosis demonstrated. Vertebrals are patent with antegrade flow. - Palmer arch evaluation - Doppler waveforms remained normal bilaterally with both radial and ulnar compressions. 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 There is calcification of the right and noncoronary cusps with partial fusion. There appears to be moderately retricted motion. There is moderate to severe AI.  MV is normal  TV is normal  PV is normal.  LV function is normal  Aortic root and ascending aorta are normal in size.  Large PFO by 2D images and with injection of agitated saline.   Assessment/Plan:  Results of TEE noted.  Await cardiac CT and PFT's.  We tentatively plan AVR + closure of PFO on Tuesday 3/12.  It does not look like OR this Friday will be feasible.  [name] could potentially go home tomorrow after CT and PFT's are done.  I will see her back in the office on Monday 3/11 at which time she will also go to Short Stay for repeat labs.  All questions  answered.  Purcell Nails 01/10/2012 6:47 PM

## 2012-01-10 NOTE — H&P (View-Only) (Signed)
Patient ID: Courtney Harvey, female   DOB: 1956/02/16, 56 y.o.   MRN: [id number] @ Subjective:  Denies SSCP, palpitations or Dyspnea  Objective:  Filed Vitals:   01/09/12 0535 01/09/12 1400 01/09/12 2200 01/10/12 0600  BP: 128/67 116/72 135/76 156/82  Pulse: 61 78 87 84  Temp: 97.8 F (36.6 C) 97.4 F (36.3 C) 98 F (36.7 C) 97.7 F (36.5 C)  TempSrc:  Oral    Resp: 18 20 16 14   Height:      Weight:      SpO2: 96% 96% 94% 96%    Intake/Output from previous day:  Intake/Output Summary (Last 24 hours) at 01/10/12 0814 Last data filed at 01/09/12 1800  Gross per 24 hour  Intake      0 ml  Output      2 ml  Net     -2 ml    Physical Exam: Affect appropriate Healthy:  appears stated age HEENT: normal Neck supple with no adenopathy JVP normal no bruits no thyromegaly Lungs clear with no wheezing and good diaphragmatic motion Heart:  S1/S2 AS/AR  murmur, no rub, gallop or click PMI normal Abdomen: benighn, BS positve, no tenderness, no AAA no bruit.  No HSM or HJR Distal pulses intact with no bruits No edema Neuro non-focal Skin warm and dry No muscular weakness   Lab Results: Basic Metabolic Panel:  Basename 01/09/12 0640 01/08/12 0530  NA 140 138  K 4.1 3.8  CL 104 104  CO2 27 26  GLUCOSE 104* 112*  BUN 17 19  CREATININE 0.79 0.77  CALCIUM 9.5 9.0  MG -- --  PHOS -- --   Liver Function Tests:  [facility] 01/09/12 0640  AST 13  ALT 14  ALKPHOS 78  BILITOT 0.3  PROT 6.8  ALBUMIN 3.4*   No results found for this basename: LIPASE:2,AMYLASE:2 in the last 72 hours CBC:  Basename 01/09/12 0640 01/08/12 0530  WBC 6.0 6.9  NEUTROABS -- --  HGB 13.6 13.5  HCT 39.9 40.5  MCV 90.9 91.2  PLT 186 199     Basename 01/09/12 0640  TSH 3.201  T4TOTAL --  T3FREE --  THYROIDAB --   Anemia Panel: No results found for this basename: VITAMINB12,FOLATE,FERRITIN,TIBC,IRON,RETICCTPCT in the last 72 hours   Cardiac Studies:  ECG:  NSR normal borderline  LVH   Telemetry: NSR no arrythmia  Echo:  [place] mod to severe AS and mod to severe AR  Medications:     . aspirin EC  325 mg Oral Daily  . chlorhexidine  5 mL Mouth/Throat BID  . nicotine  21 mg Transdermal Daily       . sodium chloride      Assessment/Plan:  AS/AR:  For TEE today.  CT for minimally invasive planning Tuesday.  ? Surgery Friday with Dr Cornelius Moras Smoking:  Continue 21 mg patch  No CAD.    Pre TEE orders placed for Dr Glennis Brink [facility] 01/10/2012, 8:14 AM

## 2012-01-10 NOTE — Interval H&P Note (Signed)
History and Physical Interval Note:  01/10/2012 12:34 PM  Courtney Harvey  has presented today for surgery, with the diagnosis of Aortic stenosis and Aortic regurgitation  The various methods of treatment have been discussed with the patient and family. After consideration of risks, benefits and other options for treatment, the patient has consented to  Procedure(s) (LRB): TRANSESOPHAGEAL ECHOCARDIOGRAM (TEE) (N/A) as a surgical intervention .  The patients' history has been reviewed, patient examined, no change in status, stable for surgery.  I have reviewed the patients' chart and labs.  Questions were answered to the patient's satisfaction.     Dietrich Pates  Case reviewed.  Plan TEE as noted above to evaluate aortic valve.

## 2012-01-11 ENCOUNTER — Inpatient Hospital Stay (HOSPITAL_COMMUNITY): Payer: PRIVATE HEALTH INSURANCE

## 2012-01-11 ENCOUNTER — Encounter (HOSPITAL_COMMUNITY): Payer: Self-pay | Admitting: Internal Medicine

## 2012-01-11 DIAGNOSIS — Z720 Tobacco use: Secondary | ICD-10-CM

## 2012-01-11 DIAGNOSIS — I10 Essential (primary) hypertension: Secondary | ICD-10-CM

## 2012-01-11 DIAGNOSIS — I359 Nonrheumatic aortic valve disorder, unspecified: Secondary | ICD-10-CM

## 2012-01-11 MED ORDER — IOHEXOL 350 MG/ML SOLN
70.0000 mL | Freq: Once | INTRAVENOUS | Status: AC | PRN
  Administered 2012-01-11: 70 mL via INTRAVENOUS

## 2012-01-11 MED ORDER — METOPROLOL TARTRATE 1 MG/ML IV SOLN: 5.0000 mg | INTRAVENOUS | Status: DC

## 2012-01-11 MED ORDER — ASPIRIN 325 MG PO TBEC: 325.0000 mg | DELAYED_RELEASE_TABLET | Freq: Every day | ORAL | Status: DC

## 2012-01-11 MED ORDER — IOHEXOL 350 MG/ML SOLN
80.0000 mL | Freq: Once | INTRAVENOUS | Status: AC | PRN
  Administered 2012-01-11: 80 mL via INTRAVENOUS

## 2012-01-11 MED ORDER — NITROGLYCERIN 0.4 MG SL SUBL
SUBLINGUAL_TABLET | SUBLINGUAL | Status: AC
  Administered 2012-01-11: 5 mg via INTRAVENOUS
  Filled 2012-01-11: qty 25

## 2012-01-11 MED ORDER — ALBUTEROL SULFATE (5 MG/ML) 0.5% IN NEBU
2.5000 mg | INHALATION_SOLUTION | Freq: Once | RESPIRATORY_TRACT | Status: AC
  Administered 2012-01-11: 2.5 mg via RESPIRATORY_TRACT

## 2012-01-11 MED ORDER — NITROGLYCERIN 0.4 MG SL SUBL
0.4000 mg | SUBLINGUAL_TABLET | Freq: Once | SUBLINGUAL | Status: DC
Start: 2012-01-11 — End: 2012-01-11

## 2012-01-11 MED ORDER — METOPROLOL TARTRATE 1 MG/ML IV SOLN
INTRAVENOUS | Status: AC
  Administered 2012-01-11: 5 mg via INTRAVENOUS
  Filled 2012-01-11: qty 10

## 2012-01-11 MED ORDER — NICOTINE 21 MG/24HR TD PT24: 1.0000 | MEDICATED_PATCH | Freq: Every day | TRANSDERMAL | Status: DC

## 2012-01-11 MED ORDER — NITROGLYCERIN 0.4 MG SL SUBL: 0.4000 mg | SUBLINGUAL_TABLET | Freq: Once | SUBLINGUAL | Status: DC

## 2012-01-11 NOTE — Discharge Summary (Signed)
Discharge Summary   Patient ID: Courtney Harvey,  MRN: [id number], DOB/AGE: 56-Nov-1957 56 y.o. 56 y.o.  Admit date: 01/07/2012 Discharge date: 01/11/2012  Discharge Diagnoses Active Problems:  Aortic insufficiency and aortic stenosis  Hypertension   Allergies Allergies  Allergen Reactions  . Peanut-Containing Drug Products Other (See Comments)    headache  . Wellbutrin (Bupropion Hcl)     itching    Diagnostic Studies/Procedures  Transthoracic echocardiogram- 01/07/12  Full results on [facility] document  CONCLUSIONS 1. There is normal LV systolic function 2. The estimated EF is 55-60% 3. Mild concentric LV hypertrophy is observed 4. Abnormal left ventricular diastolic filling is observed, consistent with impaired relaxation 5. The AV is not well visualized but likely bicuspid 6. Severe aortic leaflet clclification is visualized.  7. There is moderate to severe aortic regurgitation. 8. There is moderate to severe aortic stenosis 9. The mean gradient to the aortic valve is 38 mmHg 10. The aortic valve area, by VTIs, is calculated at 1.02 cm. 11. The calculated aortic regurgitation pressure half-time is 12. There is mild mitral regurgitation.   Cardiac catheterization- 01/07/12  Coronary Arteries:  Left dominant with no anomalies  LM: normal  LAD: Normal  D1: normal  D2 normal  Circumflex: Dominant and normal  OM1: normal  OM2: normal  PLB: 3 of them normal  PDA: normal  RCA: normal  Ventriculography: EF: 60 %, no RWMA;s as seen after aortic root injection with severe AR  Aortography: Severe AR and limited motion of AV. Aortic root is calcified and dilated  Hemodynamics:  Aortic Pressure: 129 74 mmHg MRA: 2 mmHg  RV: 24/3 mmHg  PA: 19/4 mean 12 mmHg  PCWP: Mean 12 mmHg  CO: 4.94 L/Min CI: 2.74 L/Min/M2  Impression: Echo data from [place] shows moderate to severe AS and moderate to severe AR. Angio shows severe AR. I think she needs her AV  replaced. Would do TEE on Monday. Also would do TAVR protocol chest CT to plan AVR with Dr Cornelius Moras. Do not order regular CT this weekend. I will arrange CT for Monday Depending on timing of TEE. Will call Dr Cornelius Moras for consult. Patient tolerated the procedure well.  Doppler carotids, limited upper extremity arterial evaluation and ABIs- 01/09/12  Study information:  Study status: Routine. Procedure: A vascular evaluation was performed. Image quality was good. Preoperative vascular evaluation for heart surgery. Carotid duplex exam per standard extablished protocols, limited upper extremity arterial evaluation and ABIs as indicated. Location: Bedside. Patient status: Inpatient.  Summary:  - No significant extracranial carotid artery stenosis demonstrated. Vertebrals are patent with antegrade flow. - Palmer arch evaluation - Doppler waveforms remained normal bilaterally with both radial and ulnar compressions.  Orthopantogram- 01/09/12  Comparison: None  Findings: Several missing lower teeth are identified.  There is no evidence of periapical abscess or identifiable caries.  The bony structures are unremarkable.  IMPRESSION:  No evidence of periapical abscess or identifiable caries.   Transesophageal echocardiogram- 01/10/12  Study Conclusions  - Aortic valve: AV appears dysplastic. There is calcification of right and noncoronary cusps with partial fusion. There is moderately restricted opening (could not get gradient). There is at least moderate insufficiency. - Left atrium: No evidence of thrombus in the atrial cavity or appendage. - Atrial septum: Large PFO as tested with injection of agitated saline. Transesophageal echocardiography. 2D and color Doppler. Height: Height: 152.4cm. Height: 60in. Weight: Weight: 71.4kg. Weight: 157lb. Body mass index: BMI: 30.7kg/m^2. Body surface area: BSA: 1.35m^2. Patient status: Inpatient.  Location:  Endoscopy. ------------------------------------------------------------ ------------------------------------------------------------ Left ventricle: LV systolic function is normal. ------------------------------------------------------------ Aortic valve: AV appears dysplastic. There is calcification of right and noncoronary cusps with partial fusion. There is moderately restricted opening (could not get gradient). There is at least moderate insufficiency. ------------------------------------------------------------ Aorta: Aortic root is normal in size. Aortic root and proximal ascending aorta are normal in size. ----------------------------------------------------------- Mitral valve: MV is normal. Trace MR ------------------------------------------------------------ Left atrium: No evidence of thrombus in the atrial cavity or appendage. ----------------------------------------------------------- Atrial septum: Large PFO as tested with injection of agitated saline. ------------------------------------------------------------ Tricuspid valve: TV is normal. Trace TR. ------------------------------------------------------------ Post procedure conclusions Ascending Aorta:  - Aortic root is normal in size. Aortic root and proximal ascending aorta are normal in size.  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 There is calcification of the right and noncoronary cusps with partial fusion. There appears to be moderately retricted motion. There is moderate to severe AI.  MV is normal  TV is normal  PV is normal.  LV function is normal  Aortic root and ascending aorta are normal in size.  Large PFO by 2D images and with injection of agitated saline.  CT- abdomen, pelvis- 01/11/12  Comparison: No priors.  Findings:  Lung Bases: Unremarkable.  Abdomen/Pelvis: The visualized portions of the liver, gallbladder,  pancreas, spleen, bilateral adrenal glands and bilateral kidneys is   unremarkable. There is no ascites or pneumoperitoneum and no  definite pathologic distension of bowel within the visualized  portions of the abdomen or pelvis. No definite pathologic  lymphadenopathy noted. Appendix is normal. Visualized portions of  the uterus and ovaries and superior aspect of the urinary bladder  are unremarkable.  Musculoskeletal: There are no aggressive appearing lytic or blastic  lesions noted in the visualized portions of the skeleton. Advanced  multilevel degenerative disc disease, most severe at L4-L5 where  there is near complete loss of intervertebral disc space, with  associated vacuum disc phenomenon and advanced discogenic changes  of the adjacent vertebral body endplates.  VASCULAR MEASUREMENTS PERTINENT TO PLANNED VALVULAR SURGERY:  AORTA:  Minimal Aortic Diameter - 17 x 16 mm  Severity of Aortic Calcification - mild  RIGHT PELVIS:  Right Common Iliac Artery -  Minimal Diameter - 9.7 x 7.3 mm  Tortuosity - mild  Calcification - mild  Right External Iliac Artery -  Minimal Diameter - 8.7 x 8.0 mm  Tortuosity - mild  Calcification - mild  Right Common Femoral Artery -  Minimal Diameter - 9.6 x 8.4 mm  Tortuosity - none  Calcification - minimal  LEFT PELVIS:  Left Common Iliac Artery -  Minimal Diameter - 9.1 x 9.7 mm  Tortuosity - mild  Calcification - mild  Left External Iliac Artery -  Minimal Diameter - 7.6 x 7.3 mm  Tortuosity - mild  Calcification - mild  Left Common Femoral Artery -  Minimal Diameter - 8.6 x 7.6 mm  Tortuosity - none  Calcification - minimal  IMPRESSION:  1. Arterial findings and measurements pertinent to planned to  minimally invasive the aortic valve replacement, as detailed above.  2. No acute findings in the visualized abdomen or pelvis.  3. Multilevel degenerative disc disease, most severe at L4-L5, as  detailed above.  CT- heart- 01/11/12  Formal results pending  Pulmonary function testing-  01/11/12  Formal results pending  History of Present Illness  Ms. Ledet is a 56yo Caucasian female with PMHx significant for AS/AI, chronic diastolic CHF and tobacco abuse who was admitted transferred  from [facility] hospital to [facility] hospital on 03/08 with complaints of chest pain.   She reported for the past 2 days prior to admission, she experienced intermittent L-anterior chest pain described as sharp. She stated that it sometimes lasted for an hour at a time. On the evening of 01/05/12, she said it felt like her heart was "shaking and beating hard" for a fairly long period of time. The following morning, she endorsed chest heaviness which persisted with associated lightheadedness and diaphoresis. She then presented to [facility] ED for further evaluation.   CXR revealed hyperaerated lungs which were otherwise clear, mild cardiomegaly without evidence of acute cardiopulmonary process. EKG without ischemic changes. CEs x 1 were negative. D-dimer WNL. BNP mildly elevated at 119. CBC and chem-7 WNL aside from hyperglycemia at 156. She was given a full-dose ASA and NTG SL with relief of her chest pain. She was subsequently admitted for further cardiac evaluation. Cardiology was consulted and a 2D echocardiogram was ordered revealing normal LVEF at 55-60%, mild concentric LVH, abnormal lLV diastolic filling consistent with impaired relaxation, bicuspid aortic valve, mod-severe AS and mod-severe AR AV mean gradient 38mm Hg, AV area by VTIs calculated at 1.02 cm, mild MR. The decision was made to transfer to [facility] for cardiac catheterization.   Hospital Course   She was transferred to [facility]. She was informed, consented and underwent the procedure which elicited the above details including no evidence of CAD, LVEF 60% without WMAs; severe AI and AS was noted with aortic root calcification and dilation. The recommendation was made to consult [facility] for consideration of AVR. TAVR protocol was initiated with  plans for TEE and cardiac CT among other diagnostic studies.   She remained stable and asymptomatic. Chronic diastolic heart failure was found to be well-compensating. [facility] evaluated the patient and discussed potential interventional options. Risks and benefits were discussed, and placed in the context of the patient's particular circumstances. As a result, she elective for tissue AVR in order to avoid Coumadin therapy. Pre-procedure doppler studies were ordered revealing the above details, notably no significant extracranial carotid artery stenosis with patent vertebral arteries. Additionally, an orthopantogram with formal dental consult was conducted revealing the above details. Recommended dental procedures were deferred until after AVR.   She remained asymptomatic and underwent tobacco cessation counseling with recommendations for nicotine patch which were followed. She underwent TEE detailed above with notable findings including trileaflet AV with dysplasia, calcification of right and noncoronary cusps with partial fusions, moderate to severe AI noted, normal MV/PV/TV/LVEF, normal aortic root and ascending aorta size and large PFO noted. Plans were made to pursue AVR on 03/19, with follow-up at the [facility] office on 03/18. Cardiac CT and CT- abdomen/pelvis were conducted. Cardiac CT formal results pending. CT-A/P without acute process, DDD noted. PFTs were performed as well with formal interpretation pending at the time of this dictation.   Today, she is stable, at baseline and will be discharged home. She will follow-up next week with [facility]. She will be contacted regarding the appointment date and time. She will be discharged with a nicotine patch and NTG PRN SL for chest pain. She will continue all other outpatient medications. This information has been clearly outlined in the discharge AVS.  Discharge Vitals:  Blood pressure 108/52, pulse 67, temperature 97.9 F (36.6 C), temperature source Oral, resp.  rate 20, height 5' 6.5" (1.689 m), weight 71.215 kg (157 lb), SpO2 99.00%.   Labs: Recent Labs  Basename 01/09/12 0640   WBC  6.0   HGB 13.6   HCT 39.9   MCV 90.9   PLT 186   Lab 01/09/12 0640 01/08/12 0530  NA 140 138  K 4.1 3.8  CL 104 104  CO2 27 26  BUN 17 19  CREATININE 0.79 0.77  CALCIUM 9.5 9.0  PROT 6.8 --  BILITOT 0.3 --  ALKPHOS 78 --  ALT 14 --  AST 13 --  AMYLASE -- --  LIPASE -- --  GLUCOSE 104* 112*    Basename 01/09/12 0640  TSH 3.201  T4TOTAL --  T3FREE --  THYROIDAB --   Disposition:   Follow-up Information    Follow up with Purcell Nails, MD. ([facility] will call with appointment date and time. )    Contact information:   [address] [place] [address] [phone]         Discharge Medications:  Medication List  As of 01/11/2012  2:35 PM   START taking these medications         aspirin 325 MG EC tablet   Take 1 tablet (325 mg total) by mouth daily.      nicotine 21 mg/24hr patch   Commonly known as: NICODERM CQ - dosed in mg/24 hours   Place 1 patch onto the skin daily.      nitroGLYCERIN 0.4 MG SL tablet   Commonly known as: NITROSTAT   Place 1 tablet (0.4 mg total) under the tongue once.         CONTINUE taking these medications         loratadine 10 MG tablet   Commonly known as: CLARITIN          Where to get your medications    These are the prescriptions that you need to pick up. We sent them to a specific pharmacy, so you will need to go there to get them.   [facility] - [place], [place] - [address]    [address] [place] [address]    Phone: [phone]        nicotine 21 mg/24hr patch   nitroGLYCERIN 0.4 MG SL tablet         Information on where to get these meds is not yet available. Ask your nurse or doctor.         aspirin 325 MG EC tablet           Outstanding Labs/Studies: Formal PFT interpretation, formal cardiac CT  results and interpretation  Duration of Discharge Encounter: Greater than 30 minutes including physician time.  Signed, R. Hurman Horn, PA-C 01/11/2012, 2:35 PM

## 2012-01-11 NOTE — Discharge Instructions (Addendum)
Aortic Valve Replacement You have a disease of one of the valves of your heart. In you or your child's case, it is the aortic valve which needs replacing. Aortic valve replacement is open heart surgery done by a heart surgeon. This operation treats problems with the aortic valve. The aortic valve is the "outflow valve" for the left side of the heart. The left side of your heart (left ventricle) is the large muscular part of the heart that pumps blood to the rest of the body. It separates the left ventricle from the aorta. When the heart squeezes down (contracts), the aortic valve is what keeps the blood from flowing back into the ventricle from the aorta. This allows the blood to keep moving through the body.  Surgery may be necessary when the valve does not open or close completely. A stenotic (narrow) valve does not let the blood leave the heart normally. This causes blood to back up in the left ventricle. This makes it hard for the heart to increase the amount of blood that it pumps. The heart has to work harder. This may produce shortness of breath and fatigue. Problems are worse with activity.  If the valve leaflets do not meet correctly when closing, blood may leak backward into the ventricle each time the heart pumps. This is called aortic insufficiency. When some of the blood leaks backwards, the heart has to work even harder. The heart can allow for this over-work for a long time if the leakage came on slowly. Eventually, the heart fails.  Aortic valve problems may be caused by a birth defect. This is called congenital. Wear and tear can cause valves to fail. More commonly, rheumatic fever may damage the aortic valve. Occasionally, the valve may be damaged by infection. This also causes the aortic valve to leak.  DESCRIPTION OF SURGERY Aortic valves can be repaired. When the valve is too damaged to repair, the valve must be replaced. A prosthetic (artificial) valve is used to do this. Valves damaged  by rheumatic disease often must be replaced.  Two types of artificial valves are available:  Mechanical valves made entirely from man-made materials.   Biological valves which are made from animal tissues or taken from a cadaver.  Each has advantages and disadvantages. The choice of which type to use should be made by you and your surgeon. Your risks, age, lifestyle, other medical problems including the decision on whether to be on blood thinners the rest of your life all will help you decide on which type of valve to use. There are a number of good MECHANICAL PROSTHESES available. All work well. The main advantage of mechanical valves is that they do not wear out. Their main disadvantage is that blood clots easier on mechanical valves. If this happens the valve will not work normally. Because of this, patients with mechanical valves must take anticoagulants (blood thinners) for life. There is also a small but definite risk of blood clots causing stroke, even when taking anticoagulants.  There are a number of BIOLOGICAL CHOICES for aortic valve replacement. Most are made from pig aortic valves. Some are taken from cadavers. The main advantage is that they have a reduced risk of blood clots forming on the valve. This lessens the chance of the valve not working or causing a stroke. A large disadvantage of biological or tissue valves is that they wear out sooner than mechanical valves. The rate at which they wear out depends on the patient's age. A young  boy might wear out such a valve in only a few years. The same valve might last 10 years in a middle aged person, and even longer in a patient over the age of 43. A tissue valve used in a person over 55 years old may never need replacement. RISKS AND COMPLICATIONS Your cardiologist and cardiothoracic surgeon can best determine your individual risk. It will depend on your age, general condition, medical conditions, and your heart function. In general, the risks  include:  Problems from the operation itself are low risk. Some common risks are:   Risks from the anesthesia.   Bleeding and infection.   Lifelong treatment with medications to prevent blood clots is needed for mechanical valve replacements.   Infection is more common with valve replacement than with valve repair.   Valve failure is more common with valve replacement than with valve repair. Pig valves tend to fail after about 8 to 10 years.  PROCEDURE  Valve repair or replacement is open-heart surgery. You are given general anesthesia (medications to help you sleep). You are then placed on a heart-lung machine. This machine provides oxygen to your blood while the heart is not working. The surgery generally lasts from 3 to 5 hours. During surgery, the surgeon makes a large incision (cut) in the chest. Sometimes the heart is cooled to slow or stop the heartbeat. The damaged aortic valve is either repaired or removed and replaced with an artificial heart valve. AFTER THE PROCEDURE  Recovery from heart valve surgery usually involves a few days in an intensive care unit (ICU) of a hospital. Full recovery from heart valve surgery can take several months.   Anticoagulation (blood thinning) treatment with warfarin is often prescribed for 6 weeks to 3 months after surgery for those with biological valves. It is prescribed for life for those with mechanical valves.   Recovery includes healing of the surgical incision. There is a gradual building of stamina and exercise abilities. An exercise program under the direction of a physical therapist may be recommended.   Once you have an artificial valve, your heart function and your life will return to normal. You usually feel better after surgery. Shortness of breath and fatigue should lessen. If your heart was already severely damaged before your surgery, you may continue to have problems.   You can usually resume most of your normal activities. You will  have to continue to monitor your condition. You need to watch out for blood clots and infections.   Artificial valves need to be replaced after a period of time. It is important that you see your caregiver regularly.   Some individuals with an aortic valve replacement need to take antibiotics before having dental work or other surgical procedures. This is called prophylactic antibiotic treatment. These drugs help to prevent infective endocarditis. Antibiotics are only recommended for individuals with the highest risk for developing infective endocarditis. Let your dentist and your caregiver know if you have a history of any of the following so that the necessary precautions can be taken:   A VSD.   A repaired VSD.   Endocarditis in the past.   An artificial (prosthetic) heart valve.  HOME CARE INSTRUCTIONS   Use all medications as prescribed.   Take your temperature every morning for the first week after surgery. Record these.   Weigh yourself every morning for at least the first week after surgery and record.   Do not lift more than 10 pounds (4.5 kg) until your  sternum (breastbone) has healed. Avoid all activities which would place strain on your incision.   You may shower but do not take baths until instructed by your caregivers.   Avoid driving for 4 to 6 weeks following surgery or as instructed.   Use your elastic stockings during the day. You should wear the stockings for at least 2 weeks after discharge or longer if your ankles are swollen. The stockings help blood flow and help reduce swelling in the legs. It is easiest to put the stockings on before you get out of bed in the morning. They should fit snugly.  SEEK IMMEDIATE MEDICAL CARE IF:  You develop chest pain which is not coming from your incision (surgical cut) .   You develop shortness of breath.   You develop a temperature over 101 F (38.3 C).   You have a sudden weight gain. Let your caregiver know what the  weight gain is.  Document Released: 03/09/2005 Document Revised: 10/07/2011 Document Reviewed: 10/14/2008 [facility] Patient Information 2012 [place], [place].Heart Valve Problems General Information Heart valves open to allow blood to be pumped forward. They close to prevent fluid from flowing backward. Human heart valves are formed by flaps of tissue called leaflets or cusps. The heart has 4 valves:  Aortic.   Mitral.   Tricuspid.   Pulmonary.  HEART-VALVE PROBLEMS FALL INTO TWO CATEGORIES:   Stenosis. The opening of the valve is too narrow. This interferes with the forward flow of blood.   Regurgitation. The valve does not close properly. It leaks, sometimes causing a significant backflow of blood.  CONGENITAL AND ACQUIRED PROBLEMS Heart-valve problems can be congenital. This means present at birth. They can also be acquired following birth. Congenital heart-valve disease affects about one in 1,000 newborns. The majority of these infants have narrowing of either the pulmonary or aortic valve. The exact cause of heart defects at birth is not known. Heart defects seem to run in families. Because of this, it is felt that there is a genetic (inherited) cause.  In 2 percent to 4 percent of heart valve problems, the heart defect is related to health or environmental factors that affected the mother during pregnancy. These include:  Diabetes.   Phenylketonuria (a rare disease of metabolism).   Rubella ("[place] measles").   Systemic lupus erythematosus.   Drugs taken by the mother:   Alcohol.   Street drugs.   Lithium.   Certain seizure medications.  Your caregiver can explain any medical conditions listed which may apply to you. A heart-valve problem is acquired if it happens in a valve that was normal at birth. Some common causes of acquired heart-valve problems include:  Rheumatic fever - An illness that may follow an untreated strep throat infection.   Endocarditis -  Infection of the heart valves.   Idiopathic calcific aortic stenosis - A worsening condition seen in the elderly, in which the aortic valve becomes:   Thickened.   Fused.   Full of calcium deposits.   Syphilis.   High blood pressure.   Arteriosclerosis (blood vessels thickening and losing elasticity).   Connective-tissue disorders - Such as Marfan's syndrome (a rare inherited problem).  Your caregiver can explain any medical conditions listed that may apply to you. HEART-VALVE PROBLEMS AFFECT EACH VALVE IN SLIGHTLY DIFFERENT WAYS. Aortic Valve The aortic valve opens to allow blood to pass from the left ventricle to the aorta. This large blood vessel leads oxygenated blood (blood that is rich in oxygen after passing through  the lungs) from the heart to the rest of the body. Disorders of this valve include:  Congenital aortic stenosis - The problem is almost always a valve that has 2 flaps instead of the usual 3 (bicuspid aortic valve).   In about 10 percent of affected newborns, the aortic valve is so narrow that the child develops severe symptoms in the first year of life.   In the remaining 90 percent, aortic stenosis is discovered during a physical examination.   Acquired aortic stenosis - Aortic stenosis accounts for 25 percent of all heart valve problems in adults. 80 percent of patients are female. In adulthood, aortic stenosis usually is caused by:   Rheumatic fever.   Idiopathic calcific aortic stenosis (a condition where calcium deposits narrow the opening at the valve).   Aortic regurgitation - The aortic valve does not close properly. This allows for blood to flow backward into the left ventricle. This decreases the forward flow of oxygenated blood through the aorta. Backflow also stretches the ventricle out of shape. In adults, about two-thirds of cases are caused by rheumatic fever. 75 percent of patients are female.  Mitral Valve The mitral valve opens to allow blood to  pass from the left atrium to the left ventricle. Disorders of this valve include:  Mitral stenosis - The common adult patient is a woman whose mitral valve was damaged by rheumatic fever. In many cases, the rheumatic fever is mild, and it is hard to know exactly when this problem originally happened.   Mitral regurgitation - The most common conditions causing mitral regurgitation are:   Mitral valve prolapse. The leaflets of the mitral valve fail to close properly. It tends to affect women between the ages of 76 and 49. The underlying cause is unknown. Most patients never have symptoms.   Rheumatic fever.   Infective endocarditis (infection that gets to the inner walls of the heart and the valves).   Buildup of calcium deposits on the valve.   Blockages in the coronary arteries and other blood vessels that provide oxygen and blood to the heart muscle.  Pulmonary Valve The pulmonary valve is located between the right ventricle and the pulmonary artery (the artery that takes blood from the heart to the lungs). Blood passes through this valve to allow oxygen-poor blood to flow from the right side of the heart to the lungs for oxygenation. Disorders of this valve include:  Congenital pulmonic stenosis - Infants develop heart failure or cyanosis (a bluish color to the lips, fingernails and skin) within the first month of life. In most cases the valve is deformed. Two or three leaflets are partially fused.   Adult disorders of the pulmonic valve - The pulmonic valve most often is damaged because of pulmonary hypertension (high pressure within the blood vessels in the lungs). This condition is usually related to chronic obstructive pulmonary disease or emphysemsa.  Tricuspid Valve The tricuspid valve allows blood to flow from the right atrium to the right ventricle. Disorders of this valve include:  Tricuspid stenosis - This usually is caused by an episode of rheumatic fever. This fever often  damages the mitral valve at the same time. This condition is rare in [place] and [place].   Tricuspid regurgitation - Usually occurs because of pulmonary hypertension. Also can be caused by:   Heart failure.   Trauma   Endocarditis   Myocardial infarction ("heart attack").  SYMPTOMS  Many patients with mild heart-valve problems have no cardiac symptoms. The  abnormal valve is discovered only when a heart murmur is heard during an exam. For more severe heart-valve problems, symptoms vary slightly according to the specific valve involved.  Congenital heart-valve problems - Severe valve narrowing can cause:  Cyanosis (a bluish coloring to the skin).   Symptoms of heart failure.   Aortic stenosis - Aortic stenosis usually does not produce symptoms until the valve opening narrows to about one-third of normal. This generally happens between the ages of 32 and 62. Symptoms include:   Shortness of breath during exercise.   Heart-related chest pain.   Fainting spells.  Aortic regurgitation - A patient can have significant aortic regurgitation for 10 to 15 years without developing significant symptoms. When symptoms begin, there may be:  Palpitations.   Cardiac arrhythmias.   Shortness of breath during exercise.   Breathlessness while lying down.   Sudden and severe shortness of breath during the middle of the night.   Sweating.   Chest pains and symptoms of heart failure as outlined above.  Mitral stenosis - Symptoms include:  Shortness of breath during exercise.   Sudden and severe shortness of breath during the middle of the night.   Abnormal heart rhythms, especially atrial fibrillation.   Coughing up blood.  In some patients, blood clots form in the left atrium. These clots can travel through blood vessels and damage the brain, spleen or kidneys.  Mitral regurgitation - Symptoms include:  Fatigue.   Shortness of breath on exertion.   Breathlessness while lying  down.  Pulmonic-valve problems - Symptoms include:  Fatigue.   Fainting spells.   Symptoms of heart failure.  Tricuspid stenosis - This usually causes fatigue and symptoms of heart failure. Many patients have symptoms of mitral stenosis at the same time.  Tricuspid regurgitation - This primarily causes symptoms of heart failure, especially heart-related breathing problems.  DIAGNOSIS  If you are having symptoms, your caregiver will begin by learning about your risk of heart valve problems. Your caregiver will ask questions about:  Family history of heart problems.   Your personal history of rheumatic fever.   Syphilis.   Hypertension.   Arteriosclerosis or connective-tissue disorders.   Your risk of endocarditis caused by:   Intravenous (IV) drug use.   A recent medical or dental procedure (problems after dental procedures are rare).   In infants the mother's health or environmental risk factors during pregnancy.  Your caregiver may suspect that you have a heart-valve problem based on your:  Specific symptoms.   Medical history.  Your caregiver will perform a physical examination with special attention to your heart.  Your caregiver will order diagnostic tests. These may include:  An electrocardiogram (EKG).   A chest X-ray.   Blood tests to check for infection in patients with suspected endocarditis.   An echocardiogram.   Doppler echocardiography.   Cardiac catheterization.  In people who do not have any symptoms, diagnostic testing may become necessary after the caregiver discovers a new heart murmur during a routine physical exam. EXPECTED DURATION In general, heart-valve problems:  Continue throughout life.   May gradually worsen with time.  Those caused by endocarditis sometimes may produce severe symptoms and rapid worsening within a few days. PREVENTION  Currently, there is no way to prevent the majority of congenital heart-valve problems. Pregnant  women should have regularly scheduled prenatal care and should avoid using alcohol. You can prevent many acquired heart-valve problems by preventing rheumatic fever. If you have any condition requiring treatment  with antibiotics, take them exactly as prescribed.  TREATMENT   If you have a mild heart-valve problem without any symptoms, your caregiver may simply watch over your condition.   If you have moderate or severe symptoms, your treatment will be decided by:   How severe your symptoms are.   The results of diagnostic tests.  Your caregiver can give you medications to temporarily treat symptoms such as:   Angina.   Cardiac arrhythmias.   Heart failure.  You eventually may need to have the abnormal valve repaired or replaced. This can be done in several different ways:   Percutaneous balloon valvoplasty (for stenosis) - A tiny catheter (flexible tube) with a balloon at its tip is passed through the narrowed heart valve. The tiny balloon then is inflated and pulled back through the narrowed valve to widen it.   Valvotomy using traditional surgery (for stenosis) - The surgeon opens the heart and separates valve leaflets that are fused together.   Valve replacement - Defective heart valves can be replaced with a mechanical heart valve made of:   Plastic.   Dacron.   Other material.  Defective heart valves can also be replaced with a biological valve made of tissue taken from:  A pig.   A cow.   A deceased human donor.  After surgery, patients with mechanical valves must take medications to prevent blood clots. If you have been diagnosed with a heart-valve problem, ask your caregiver if you are at risk of endocarditis. If so, you will need to take antibiotics before any medical or dental procedure in which bacteria may enter your blood and infect your abnormal valve.  SEEK IMMEDIATE MEDICAL CARE IF:  You experience any symptoms that may be related to a heart problem,  especially:  Shortness of breath.   Chest pain.   Rapid or irregular heartbeat.   Fainting spells.  Document Released: 01/08/2004 Document Revised: 10/07/2011 Document Reviewed: 09/07/2007 [facility] Patient Information 2012 [place], [place].

## 2012-01-11 NOTE — Progress Notes (Signed)
Pt discharged by wheelchair to private vehicle home. V/S stable, instructions given on Surgery next week and the surgery office information.

## 2012-01-11 NOTE — Progress Notes (Signed)
UR Completed. Simmons, Nikelle Malatesta F [phone]  

## 2012-01-11 NOTE — Progress Notes (Signed)
Patient ID: Courtney Harvey, female   DOB: 1956/05/08, 56 y.o.   MRN: [id number] @ Subjective:  Denies SSCP, palpitations or Dyspnea  Objective:  Filed Vitals:   01/10/12 1600 01/10/12 1630 01/10/12 2100 01/11/12 0500  BP: 117/81 130/62 117/77 115/73  Pulse: 90 80 68 70  Temp:   98.5 F (36.9 C) 97.9 F (36.6 C)  TempSrc:   Oral Oral  Resp:   18 20  Height:      Weight:      SpO2:   97% 98%    Intake/Output from previous day:  Intake/Output Summary (Last 24 hours) at 01/11/12 0815 Last data filed at 01/10/12 1336  Gross per 24 hour  Intake    500 ml  Output      0 ml  Net    500 ml    Physical Exam: Affect appropriate Healthy:  appears stated age HEENT: normal Neck supple with no adenopathy JVP normal no bruits no thyromegaly Lungs clear with no wheezing and good diaphragmatic motion Heart:  S1/S2 AS/AR  murmur, no rub, gallop or click PMI normal Abdomen: benighn, BS positve, no tenderness, no AAA no bruit.  No HSM or HJR Distal pulses intact with no bruits No edema Neuro non-focal Skin warm and dry No muscular weakness   Lab Results: Basic Metabolic Panel:  Basename 01/09/12 0640  NA 140  K 4.1  CL 104  CO2 27  GLUCOSE 104*  BUN 17  CREATININE 0.79  CALCIUM 9.5  MG --  PHOS --   Liver Function Tests:  Basename 01/09/12 0640  AST 13  ALT 14  ALKPHOS 78  BILITOT 0.3  PROT 6.8  ALBUMIN 3.4*   No results found for this basename: LIPASE:2,AMYLASE:2 in the last 72 hours CBC:  Basename 01/09/12 0640  WBC 6.0  NEUTROABS --  HGB 13.6  HCT 39.9  MCV 90.9  PLT 186     Basename 01/09/12 0640  TSH 3.201  T4TOTAL --  T3FREE --  THYROIDAB --   Anemia Panel: No results found for this basename: VITAMINB12,FOLATE,FERRITIN,TIBC,IRON,RETICCTPCT in the last 72 hours   Cardiac Studies:  ECG:  NSR normal borderline LVH   Telemetry: NSR no arrythmia  Echo:  [place] mod to severe AS and mod to severe AR  Medications:      . aspirin EC   325 mg Oral Daily  . chlorhexidine  10 mL Mouth/Throat TID  . nicotine  21 mg Transdermal Daily  . DISCONTD: chlorhexidine  5 mL Mouth/Throat BID        . DISCONTD: sodium chloride      Assessment/Plan:  AS/AR:  TEE with ? Bicuspid valve.  PFT;s and CT this am.  D/C home for surgery next Tuesday Smoking:  Continue 21 mg patch  No CAD.      Charlton Haws 01/11/2012, 8:15 AM

## 2012-01-12 ENCOUNTER — Other Ambulatory Visit (HOSPITAL_COMMUNITY): Payer: Self-pay | Admitting: Dentistry

## 2012-01-12 MED ORDER — CHLORHEXIDINE GLUCONATE 0.12 % MT SOLN: OROMUCOSAL | Status: DC

## 2012-01-13 ENCOUNTER — Encounter (HOSPITAL_COMMUNITY): Payer: Self-pay | Admitting: Respiratory Therapy

## 2012-01-14 NOTE — Pre-Procedure Instructions (Signed)
20 Courtney Harvey  01/14/2012   Your procedure is scheduled on:  Tuesday, March 19th.  Report to [facility] at 5:30AM.  Call this number if you have problems the morning of surgery: [phone]   Remember:   Do not eat food:After Midnight.  May have clear liquids: up to 4 Hours before arrival.  Clear liquids include soda, tea, black coffee, apple or grape juice, broth.  Take these medicines the morning of surgery with A SIP OF WATER: Use Peridex Mouth rinse.  If needed Claritin and NTG tab.   Do not wear jewelry, make-up or nail polish.  Do not wear lotions, powders, or perfumes. You may wear deodorant.  Do not shave 48 hours prior to surgery.  Do not bring valuables to the hospital.  Contacts, dentures or bridgework may not be worn into surgery.  Leave suitcase in the car. After surgery it may be brought to your room.  For patients admitted to the hospital, checkout time is 11:00 AM the day of discharge.   Patients discharged the day of surgery will not be allowed to drive home.  Name and phone number of your driver: --  Special Instructions: CHG Shower Use Special Wash: 1/2 bottle night before surgery and 1/2 bottle morning of surgery.   Please read over the following fact sheets that you were given: Pain Booklet, Coughing and Deep Breathing, Blood Transfusion Information, Open Heart Packet, MRSA Information and Surgical Site Infection Prevention

## 2012-01-17 ENCOUNTER — Encounter: Payer: Self-pay | Admitting: Thoracic Surgery (Cardiothoracic Vascular Surgery)

## 2012-01-17 ENCOUNTER — Other Ambulatory Visit (HOSPITAL_COMMUNITY): Payer: Self-pay | Admitting: *Deleted

## 2012-01-17 ENCOUNTER — Inpatient Hospital Stay (HOSPITAL_COMMUNITY): Admission: RE | Admit: 2012-01-17 | Discharge: 2012-01-17 | Payer: PRIVATE HEALTH INSURANCE | Source: Ambulatory Visit

## 2012-01-17 ENCOUNTER — Encounter (HOSPITAL_COMMUNITY)
Admission: RE | Admit: 2012-01-17 | Discharge: 2012-01-17 | Disposition: A | Discharge status: Home / Self Care | Payer: PRIVATE HEALTH INSURANCE | Source: Ambulatory Visit | Attending: Thoracic Surgery (Cardiothoracic Vascular Surgery) | Admitting: Thoracic Surgery (Cardiothoracic Vascular Surgery)

## 2012-01-17 ENCOUNTER — Other Ambulatory Visit: Payer: Self-pay

## 2012-01-17 ENCOUNTER — Ambulatory Visit (INDEPENDENT_AMBULATORY_CARE_PROVIDER_SITE_OTHER): Payer: PRIVATE HEALTH INSURANCE | Admitting: Thoracic Surgery (Cardiothoracic Vascular Surgery)

## 2012-01-17 VITALS — BP 138/83 | HR 80 | Resp 18 | Ht 66.5 in | Wt 156.0 lb

## 2012-01-17 DIAGNOSIS — I352 Nonrheumatic aortic (valve) stenosis with insufficiency: Secondary | ICD-10-CM

## 2012-01-17 DIAGNOSIS — Z79899 Other long term (current) drug therapy: Secondary | ICD-10-CM | POA: Insufficient documentation

## 2012-01-17 DIAGNOSIS — I359 Nonrheumatic aortic valve disorder, unspecified: Secondary | ICD-10-CM

## 2012-01-17 DIAGNOSIS — I350 Nonrheumatic aortic (valve) stenosis: Secondary | ICD-10-CM

## 2012-01-17 DIAGNOSIS — Z01818 Encounter for other preprocedural examination: Secondary | ICD-10-CM | POA: Insufficient documentation

## 2012-01-17 DIAGNOSIS — Q211 Atrial septal defect: Secondary | ICD-10-CM

## 2012-01-17 MED ORDER — EPOETIN ALFA 4000 UNIT/ML IJ SOLN: 4000.0000 [IU] | Freq: Once | INTRAMUSCULAR | Status: DC

## 2012-01-17 MED ORDER — EPOETIN ALFA 2000 UNIT/ML IJ SOLN
4000.0000 [IU] | Freq: Once | INTRAMUSCULAR | Status: AC
  Administered 2012-01-17: 4000 [IU] via SUBCUTANEOUS

## 2012-01-17 MED ORDER — EPOETIN ALFA 2000 UNIT/ML IJ SOLN
INTRAMUSCULAR | Status: AC
  Administered 2012-01-17: 4000 [IU] via SUBCUTANEOUS
  Filled 2012-01-17: qty 2

## 2012-01-17 NOTE — Progress Notes (Signed)
[address]            [place] [address]          [phone]     CARDIOTHORACIC SURGERY OFFICE NOTE  Referring Provider is Wendall Stade, MD PCP is Louie Boston, MD, MD   HPI:  Patient returns for followup of severe aortic stenosis. She was originally seen in consultation on March 9 at which time she was hospitalized at [facility]. She returns to the office today with tentatively plans to proceed with aortic valve replacement tomorrow.  She reports no new problems since she was discharged from the hospital. She has stable exertional shortness of breath. She does report for the first time that she is a [employer] and that she does not wish to have blood products administered when she has surgery.   Current Outpatient Prescriptions  Medication Sig Dispense Refill  . aspirin EC 325 MG EC tablet Take 1 tablet (325 mg total) by mouth daily.  30 tablet    . chlorhexidine (PERIDEX) 0.12 % solution Rinse with 15 mls twice daily for 30 seconds. Use after breakfast and at bedtime. Spit out excess. Do not swallow.  480 mL  5  . loratadine (CLARITIN) 10 MG tablet Take 10 mg by mouth daily as needed. For allergy      . nicotine (NICODERM CQ - DOSED IN MG/24 HOURS) 21 mg/24hr patch Place 1 patch onto the skin daily.  28 patch  0  . nitroGLYCERIN (NITROSTAT) 0.4 MG SL tablet Place 1 tablet (0.4 mg total) under the tongue once.  25 tablet  0   Current Facility-Administered Medications  Medication Dose Route Frequency Provider Last Rate Last Dose  . epoetin alfa (EPOGEN,PROCRIT) injection 4,000 Units  4,000 Units Subcutaneous Once Purcell Nails, MD          Physical Exam:   BP 138/83  Pulse 80  Resp 18  Ht 5' 6.5" (1.689 m)  Wt 156 lb (70.761 kg)  BMI 24.80 kg/m2  SpO2 97%  General:  Well-appearing  Chest:   Clear to auscultation  CV:   Regular rate and rhythm with systolic murmur  Incisions:  n/a  Abdomen:  Soft and  nontender  Extremities:  Warm and well-perfused.  Diagnostic Tests:  Transesophageal Echocardiography  Patient:    Courtney Harvey, Courtney Harvey MR #:       [id number] Study Date: 01/10/2012 ------------------------------------------------------------ Study Conclusions  - Aortic valve: AV appears dysplastic. There is   calcification of right and noncoronary cusps with partial   fusion. There is moderately restricted opening (could not   get gradient). There is at least moderate insufficiency. - Left atrium: No evidence of thrombus in the atrial cavity   or appendage. - Atrial septum: Large PFO as tested with injection of   agitated saline. ------------------------------------------------------------ ------------------------------------------------------------ Left ventricle:  LV systolic function is normal. ------------------------------------------------------------ Aortic valve:  AV appears dysplastic. There is calcification of right and noncoronary cusps with partial fusion. There is moderately restricted opening (could not get gradient). There is at least moderate insufficiency. ------------------------------------------------------------ Aorta:  Aortic root is normal in size. Aortic root and proximal ascending aorta are normal in size. ------------------------------------------------------------ Mitral valve:  MV is normal. Trace MR ------------------------------------------------------------ Left atrium:   No evidence of thrombus in the atrial cavity or appendage. ------------------------------------------------------------ Atrial septum:  Large PFO as tested with injection of agitated  saline. ------------------------------------------------------------ Tricuspid valve:  TV is normal. Trace TR. ------------------------------------------------------------ Post procedure conclusions Ascending Aorta: - Aortic root is normal in size. Aortic root and proximal   ascending aorta are  normal in size. ------------------------------------------------------------ Prepared and Electronically Authenticated by  Dietrich Pates, MD 2013-03-11T19:29:[id number]   CARDIAC CT CHEST   Mediastinum: Cardiac findings will be dictated separately.  There is mild prominence of the ascending thoracic aorta and isthmus of the aorta (both 3.7 cm in diameter).  Within the visualized portions of the thorax and there are no pathologically enlarged mediastinal or hilar lymph nodes.   Lungs/Pleura: Within the visualized thorax there is a background of mild paraseptal emphysema with bilateral apical blebs.  No focal airspace consolidation.  No pleural effusions.  No definite suspicious appearing pulmonary nodules or masses identified within the visualized lung parenchyma.   Upper Abdomen: Visualized portions are unremarkable.   Musculoskeletal: There are no aggressive appearing lytic or blastic lesions noted in the visualized portions of the skeleton.   IMPRESSION: 1.  Mild paraseptal emphysema. 2.  Mild prominence of the ascending thoracic aorta and isthmus of the aorta, both measuring 3.7 cm in diameter.   Cardiac CT:   Findings:   Cornary Arteries:   Left dominant with no anomalies.  LM origin is very shallow in the sinus see below.   LM-normal   LAD-normal mild calcification proximally         D1-normal       D2-normal   Circumflex- large dominant vessel normal       OM1- small and normal       OM2-large and normal   There were 3 Posterolateral branches and one PDA normal   RCA- nondominant and normal   Aortic Valve:  See also TEE report.  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  This may make right minithoracotomy challenging.   Aortic Root:    Coronal measurements:   Sinus of Valsalva: 33.83mm Sinotubular Junction: 26.3 mm Ascending Aorta at 4cm from valve plane 31.3 mm Ascending Aorta at RPA: 34.7   The LM is shallow only 7.2 mm form valve plane.  RCA ostia is normal at 14.6 mm from valve plane.   There is mild calcification of the inner curvature of the ascending aorta.  There is dense calcium at the origin of the innominate   The great vessels arise normally from the arch with no plaque or aneurysm   Virtual Basal Plane   Max/Min: 23.96mm/16.3 mm average 19.66mm Area: 354.2 sqmm derived diameter 21 mm Perimiter: 76.6 mm   Impression:      1)    Normal left dominant coronary arteries with mild       calcification of proximal LAD         2)    Normal ascending aortic root with no aneurysm/plaque and mild calcification of the inner curvature.  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 4)    Valve plane is low and to the right of the sternum with horizontal aortic root Will discuss with Dr Cornelius Moras regarding feasibility of mini right thoracotomy 5)    Shallow LM ostium only 7.2 mm from  valve plane 6)    From a surgical perpective patient also has a large PFO which may benefit from suture closure at the time of AVR Cc: Dr Elzie Rings  and Dr Tressie Stalker   CT ANGIOGRAPHY ABDOMEN AND PELVIS WITH CONTRAST AND WITHOUT CONTRAST   Technique:  Multidetector CT imaging of the abdomen was performed using the standard protocol during bolus administration of intravenous contrast. Multiplanar reconstructed images including MIPs were obtained and reviewed to evaluate the vascular anatomy.   Comparison: No priors.   Findings:   Lung Bases: Unremarkable.   Abdomen/Pelvis:  The visualized portions of the liver, gallbladder, pancreas, spleen, bilateral adrenal glands and bilateral kidneys is unremarkable.  There is no  ascites or pneumoperitoneum and no definite pathologic distension of bowel within the visualized portions of the abdomen or pelvis.  No definite pathologic lymphadenopathy noted.  Appendix is normal.  Visualized portions of the uterus and ovaries and superior aspect of the urinary bladder are unremarkable.   Musculoskeletal: There are no aggressive appearing lytic or blastic lesions noted in the visualized portions of the skeleton.  Advanced multilevel degenerative disc disease, most severe at L4-L5 where there is near complete loss of intervertebral disc space, with associated vacuum disc phenomenon and advanced discogenic changes of the adjacent vertebral body endplates.   VASCULAR MEASUREMENTS PERTINENT TO PLANNED VALVULAR SURGERY:   AORTA:   Minimal Aortic Diameter -  17 x 16 mm   Severity of Aortic Calcification -  mild   RIGHT PELVIS:   Right Common Iliac Artery -   Minimal Diameter - 9.7 x 7.3 mm   Tortuosity - mild   Calcification - mild   Right External Iliac Artery -   Minimal Diameter - 8.7 x 8.0 mm   Tortuosity - mild   Calcification - mild   Right Common Femoral Artery -   Minimal Diameter - 9.6 x 8.4 mm   Tortuosity - none   Calcification - minimal   LEFT PELVIS:   Left Common Iliac Artery -   Minimal Diameter - 9.1 x 9.7 mm   Tortuosity - mild   Calcification - mild   Left External Iliac Artery -   Minimal Diameter - 7.6 x 7.3 mm   Tortuosity - mild   Calcification - mild   Left Common Femoral Artery -   Minimal Diameter - 8.6 x 7.6 mm   Tortuosity - none   Calcification - minimal   IMPRESSION: 1.  Arterial findings and measurements pertinent to planned to minimally invasive the aortic valve replacement, as detailed above. 2.  No acute findings in the visualized abdomen or pelvis. 3.  Multilevel degenerative disc disease, most severe at L4-L5, as detailed above.   Original Report Authenticated By: Florencia Reasons,  M.D.   Impression:  Severe symptomatic aortic stenosis with moderate aortic insufficiency and normal left ventricular systolic function. The patient also has a fairly large patent foramen ovale. Cardiac CT and transesophageal echocardiogram demonstrated the fact that the patient's aortic root is relatively small in diameter, measuring less than 20 mm on multiple views. Because of this I am reluctant to consider the patient a candidate for minimally invasive approach including minithoracotomy as her surgery may require either root enlargement or root replacement. Finally, the patient has notified me for the first time today that she is a member of the [employer] and she does not wish to have blood products at the time of surgery.   Plan:  We  discussed matters at length here in the office today. We reviewed the indications, risks, and potential benefits of elective aortic valve replacement. We also discussed the rationale for concomitant closure of patent foramen ovale. We discussed issues with regard to the administration of human blood proximal and blood components. The patient was not previously educated nor knowledgeable regarding the variety of different decisions and she seemed uncertain as to whether or not she would or would not accept blood products in the event of life-threatening bleeding or profound anemia. Because of her uncertainty, we will postpone surgery to give her more time to think about matters thoroughly. We no longer will plan minimally invasive approach for surgery and we will tentatively reschedule surgery for Wednesday, March 27. I will see her back again in the office next week for further preoperative counseling. I've instructed the patient to go ahead and start taking iron supplements and we will give her 1 dose of Epogen.   Salvatore Decent. Cornelius Moras, MD 01/17/2012 10:36 AM

## 2012-01-17 NOTE — Patient Instructions (Signed)
Stop taking aspirin and start iron supplements twice daily.

## 2012-01-21 ENCOUNTER — Encounter (HOSPITAL_COMMUNITY): Payer: Self-pay | Admitting: Pharmacy Technician

## 2012-01-24 ENCOUNTER — Encounter (HOSPITAL_COMMUNITY)
Admission: RE | Admit: 2012-01-24 | Discharge: 2012-01-24 | Disposition: A | Discharge status: Home / Self Care | Payer: PRIVATE HEALTH INSURANCE | Source: Ambulatory Visit | Attending: Surgery | Admitting: Surgery

## 2012-01-24 ENCOUNTER — Other Ambulatory Visit: Payer: Self-pay

## 2012-01-24 ENCOUNTER — Ambulatory Visit (INDEPENDENT_AMBULATORY_CARE_PROVIDER_SITE_OTHER): Payer: PRIVATE HEALTH INSURANCE | Admitting: Thoracic Surgery (Cardiothoracic Vascular Surgery)

## 2012-01-24 ENCOUNTER — Encounter: Payer: Self-pay | Admitting: Thoracic Surgery (Cardiothoracic Vascular Surgery)

## 2012-01-24 ENCOUNTER — Encounter (HOSPITAL_COMMUNITY): Payer: Self-pay

## 2012-01-24 ENCOUNTER — Ambulatory Visit (HOSPITAL_COMMUNITY): Admission: RE | Admit: 2012-01-24 | Payer: PRIVATE HEALTH INSURANCE | Source: Ambulatory Visit

## 2012-01-24 VITALS — BP 133/83 | HR 85 | Temp 97.8°F | Resp 20 | Ht 66.0 in | Wt 156.7 lb

## 2012-01-24 VITALS — BP 132/81 | HR 87 | Resp 20 | Ht 66.5 in | Wt 158.0 lb

## 2012-01-24 DIAGNOSIS — I359 Nonrheumatic aortic valve disorder, unspecified: Secondary | ICD-10-CM

## 2012-01-24 DIAGNOSIS — Q211 Atrial septal defect: Secondary | ICD-10-CM

## 2012-01-24 DIAGNOSIS — I350 Nonrheumatic aortic (valve) stenosis: Secondary | ICD-10-CM

## 2012-01-24 LAB — URINALYSIS, ROUTINE W REFLEX MICROSCOPIC
Glucose, UA: NEGATIVE mg/dL
Ketones, ur: NEGATIVE mg/dL
Protein, ur: NEGATIVE mg/dL
pH: 6 (ref 5.0–8.0)

## 2012-01-24 LAB — BLOOD GAS, ARTERIAL
Acid-Base Excess: 1.2 mmol/L (ref 0.0–2.0)
TCO2: 25.9 mmol/L (ref 0–100)
pCO2 arterial: 36.1 mmHg (ref 35.0–45.0)
pO2, Arterial: 81.6 mmHg (ref 80.0–100.0)

## 2012-01-24 LAB — COMPREHENSIVE METABOLIC PANEL
BUN: 15 mg/dL (ref 6–23)
CO2: 21 mEq/L (ref 19–32)
Chloride: 104 mEq/L (ref 96–112)
Creatinine, Ser: 0.7 mg/dL (ref 0.50–1.10)
GFR calc non Af Amer: >90 mL/min (ref >90)
Glucose, Bld: 76 mg/dL (ref 70–99)
Total Bilirubin: 0.3 mg/dL (ref 0.3–1.2)

## 2012-01-24 LAB — URINE MICROSCOPIC-ADD ON

## 2012-01-24 LAB — CBC
HCT: 38.6 % (ref 36.0–46.0)
Hemoglobin: 13.6 g/dL (ref 12.0–15.0)
MCV: 89.4 fL (ref 78.0–100.0)
RBC: 4.32 MIL/uL (ref 3.87–5.11)
WBC: 8.7 10*3/uL (ref 4.0–10.5)

## 2012-01-24 MED ORDER — METOPROLOL TARTRATE 12.5 MG HALF TABLET: 12.5000 mg | ORAL_TABLET | Freq: Once | ORAL | Status: DC

## 2012-01-24 MED ORDER — CHLORHEXIDINE GLUCONATE 4 % EX LIQD: 30.0000 mL | CUTANEOUS | Status: DC

## 2012-01-24 NOTE — Progress Notes (Signed)
Dopplers,pfts,cath, tee  In epic.  Requested copy of dopplers. Pt is [place] witness.

## 2012-01-24 NOTE — Pre-Procedure Instructions (Signed)
20 Courtney Harvey  01/24/2012   Your procedure is scheduled on:  01/26/12  Report to [facility] at 530 AM.  Call this number if you have problems the morning of surgery: [phone]   Remember:   Do not eat food:After Midnight.  May have clear liquids: up to 4 Hours before arrival.  Clear liquids include soda, tea, black coffee, apple or grape juice, broth.  Take these medicines the morning of surgery with A SIP OF WATER: mouth rince,nicoderm patch,  ntg if needed   Do not wear jewelry, make-up or nail polish.  Do not wear lotions, powders, or perfumes. You may wear deodorant.  Do not shave 48 hours prior to surgery.  Do not bring valuables to the hospital.  Contacts, dentures or bridgework may not be worn into surgery.  Leave suitcase in the car. After surgery it may be brought to your room.  For patients admitted to the hospital, checkout time is 11:00 AM the day of discharge.   Patients discharged the day of surgery will not be allowed to drive home.  Name and phone number of your driver: family  Special Instructions: CHG Shower Use Special Wash: 1/2 bottle night before surgery and 1/2 bottle morning of surgery.   Please read over the following fact sheets that you were given: Pain Booklet, Coughing and Deep Breathing, Blood Transfusion Information, MRSA Information and Surgical Site Infection Prevention

## 2012-01-24 NOTE — Progress Notes (Signed)
[address]            [place] [address]          [phone]     CARDIOTHORACIC SURGERY OFFICE NOTE  Referring Provider is Wendall Stade, MD PCP is Louie Boston, MD, MD   HPI:  Patient returns for further followup of severe aortic stenosis and patent foramen ovale. Her surgery was postponed from last week because she was uncertain as to whether or not she would accept blood transfusion during or following her surgery in the event that she were to develop severe acute blood loss anemia.  The patient reports to be a member of the [employer] faith. Since last week she has been taking iron and folic acid supplements. She reports no new clinical problems and she returns for further consultation after thinking matters over at length. She is accompanied by her daughter and son-in-law for her office consultation today.   Current Outpatient Prescriptions  Medication Sig Dispense Refill  . chlorhexidine (PERIDEX) 0.12 % solution Use as directed 15 mLs in the mouth or throat 2 (two) times daily. Rinse with 15 mls twice daily for 30 seconds. Use after breakfast and at bedtime. Spit out excess. Do not swallow.      . docusate sodium (COLACE) 100 MG capsule Take 100 mg by mouth daily.      . ferrous sulfate 325 (65 FE) MG tablet Take 325 mg by mouth 2 (two) times daily.      . folic acid (FOLVITE) 800 MCG tablet Take 800 mcg by mouth daily.      Marland Kitchen loratadine (CLARITIN) 10 MG tablet Take 10 mg by mouth daily as needed. For allergy      . nicotine (NICODERM CQ - DOSED IN MG/24 HOURS) 21 mg/24hr patch Place 1 patch onto the skin daily.      . nitroGLYCERIN (NITROSTAT) 0.4 MG SL tablet Place 0.4 mg under the tongue every 5 (five) minutes as needed. For chest pain       Current Facility-Administered Medications  Medication Dose Route Frequency Provider Last Rate Last Dose  . epoetin alfa (EPOGEN,PROCRIT) injection 4,000 Units  4,000 Units Subcutaneous Once  Purcell Nails, MD       Facility-Administered Medications Ordered in Other Visits  Medication Dose Route Frequency Provider Last Rate Last Dose  . chlorhexidine (HIBICLENS) 4 % liquid 2 application  30 mL Topical UD Purcell Nails, MD      . metoprolol tartrate (LOPRESSOR) tablet 12.5 mg  12.5 mg Oral Once Purcell Nails, MD          Physical Exam:   BP 132/81  Pulse 87  Resp 20  Ht 5' 6.5" (1.689 m)  Wt 158 lb (71.668 kg)  BMI 25.12 kg/m2  SpO2 97%  General:  Well-appearing  Chest:   Clear to auscultation  CV:   Regular rate and rhythm with systolic murmur  Incisions:  n/a  Abdomen:  Soft and nontender  Extremities:  Warm and well-perfused  Diagnostic Tests:  n/a   Impression:  Severe symptomatic aortic stenosis with relatively small diameter aortic root, preserved left ventricular systolic function, and patent foramen ovale.  Plan:  We again discussed matters at length for more than 30 minutes here in the office today. The patient states that she will accept transfusion of human blood in the event that she  develops severe acute blood loss anemia during or immediately following surgery. She hopes to avoid the need for blood transfusion, but after thinking about it she admits that she would rather accept a blood transfusion and suffer any potentially serious sequelae or complications related to severe anemia or coagulopathy.   The patient understands and accepts all potential associated risks of surgery including but not limited to risk of death, stroke, myocardial infarction, congestive heart failure, respiratory failure, renal failure, bleeding requiring blood transfusion and/or reexploration, arrhythmia, heart block or bradycardia requiring permanent pacemaker, pneumonia, pleural effusion, wound infection, pulmonary embolus or other thromboembolic complication, chronic pain or other delayed complications.  All questions answered.   Salvatore Decent. Cornelius Moras, MD 01/24/2012 2:05  PM

## 2012-01-25 ENCOUNTER — Other Ambulatory Visit: Payer: Self-pay

## 2012-01-25 DIAGNOSIS — I359 Nonrheumatic aortic valve disorder, unspecified: Secondary | ICD-10-CM

## 2012-01-25 LAB — HEMOGLOBIN A1C
Hgb A1c MFr Bld: 6.2 % — ABNORMAL HIGH (ref <5.7)
Mean Plasma Glucose: 131 mg/dL — ABNORMAL HIGH (ref <117)

## 2012-01-25 MED ORDER — VERAPAMIL HCL 2.5 MG/ML IV SOLN
INTRAVENOUS | Status: DC
  Filled 2012-01-25: qty 2.5

## 2012-01-25 MED ORDER — SODIUM CHLORIDE 0.9 % IV SOLN
INTRAVENOUS | Status: DC
  Filled 2012-01-25: qty 1

## 2012-01-25 MED ORDER — POTASSIUM CHLORIDE 2 MEQ/ML IV SOLN
80.0000 meq | INTRAVENOUS | Status: DC
  Filled 2012-01-25: qty 40

## 2012-01-25 MED ORDER — PHENYLEPHRINE HCL 10 MG/ML IJ SOLN
30.0000 ug/min | INTRAVENOUS | Status: DC
  Filled 2012-01-25: qty 2

## 2012-01-25 MED ORDER — MAGNESIUM SULFATE 50 % IJ SOLN
40.0000 meq | INTRAMUSCULAR | Status: DC
  Filled 2012-01-25: qty 10

## 2012-01-25 MED ORDER — SODIUM CHLORIDE 0.9 % IV SOLN
INTRAVENOUS | Status: DC
  Filled 2012-01-25: qty 40

## 2012-01-25 MED ORDER — DOPAMINE-DEXTROSE 3.2-5 MG/ML-% IV SOLN
2.0000 ug/kg/min | INTRAVENOUS | Status: DC
  Filled 2012-01-25: qty 250

## 2012-01-25 MED ORDER — VANCOMYCIN HCL 1000 MG IV SOLR
1250.0000 mg | INTRAVENOUS | Status: AC
  Administered 2012-01-26: 1250 mg via INTRAVENOUS
  Filled 2012-01-25 (×2): qty 1250

## 2012-01-25 MED ORDER — SODIUM CHLORIDE 0.9 % IV SOLN
0.1000 ug/kg/h | INTRAVENOUS | Status: DC
  Filled 2012-01-25: qty 4

## 2012-01-25 MED ORDER — DEXTROSE 5 % IV SOLN
750.0000 mg | INTRAVENOUS | Status: DC
  Filled 2012-01-25 (×2): qty 750

## 2012-01-25 MED ORDER — EPINEPHRINE HCL 1 MG/ML IJ SOLN
0.5000 ug/min | INTRAVENOUS | Status: DC
  Filled 2012-01-25: qty 4

## 2012-01-25 MED ORDER — DEXTROSE 5 % IV SOLN
1.5000 g | INTRAVENOUS | Status: AC
  Administered 2012-01-26: .75 g via INTRAVENOUS
  Administered 2012-01-26: 1.5 g via INTRAVENOUS
  Filled 2012-01-25 (×2): qty 1.5

## 2012-01-25 MED ORDER — NITROGLYCERIN IN D5W 200-5 MCG/ML-% IV SOLN
2.0000 ug/min | INTRAVENOUS | Status: DC
  Filled 2012-01-25: qty 250

## 2012-01-26 ENCOUNTER — Encounter (HOSPITAL_COMMUNITY)
Admission: RE | Disposition: A | Discharge status: Home / Self Care | Payer: Self-pay | Source: Ambulatory Visit | Attending: Thoracic Surgery (Cardiothoracic Vascular Surgery)

## 2012-01-26 ENCOUNTER — Other Ambulatory Visit: Payer: Self-pay

## 2012-01-26 ENCOUNTER — Encounter (HOSPITAL_COMMUNITY): Payer: Self-pay | Admitting: Thoracic Surgery (Cardiothoracic Vascular Surgery)

## 2012-01-26 ENCOUNTER — Encounter (HOSPITAL_COMMUNITY): Payer: Self-pay | Admitting: *Deleted

## 2012-01-26 ENCOUNTER — Ambulatory Visit (HOSPITAL_COMMUNITY): Payer: PRIVATE HEALTH INSURANCE

## 2012-01-26 ENCOUNTER — Inpatient Hospital Stay (HOSPITAL_COMMUNITY)
Admission: RE | Admit: 2012-01-26 | Discharge: 2012-02-02 | DRG: 220 | Disposition: A | Discharge status: Home / Self Care | Payer: PRIVATE HEALTH INSURANCE | Source: Ambulatory Visit | Attending: Thoracic Surgery (Cardiothoracic Vascular Surgery) | Admitting: Thoracic Surgery (Cardiothoracic Vascular Surgery)

## 2012-01-26 ENCOUNTER — Other Ambulatory Visit: Payer: Self-pay | Admitting: Thoracic Surgery (Cardiothoracic Vascular Surgery)

## 2012-01-26 ENCOUNTER — Encounter (HOSPITAL_COMMUNITY): Payer: Self-pay | Admitting: Certified Registered Nurse Anesthetist

## 2012-01-26 ENCOUNTER — Inpatient Hospital Stay (HOSPITAL_COMMUNITY): Payer: PRIVATE HEALTH INSURANCE

## 2012-01-26 ENCOUNTER — Ambulatory Visit (HOSPITAL_COMMUNITY): Payer: PRIVATE HEALTH INSURANCE | Admitting: Certified Registered Nurse Anesthetist

## 2012-01-26 DIAGNOSIS — Q211 Atrial septal defect: Secondary | ICD-10-CM

## 2012-01-26 DIAGNOSIS — I359 Nonrheumatic aortic valve disorder, unspecified: Secondary | ICD-10-CM

## 2012-01-26 DIAGNOSIS — I059 Rheumatic mitral valve disease, unspecified: Secondary | ICD-10-CM | POA: Diagnosis present

## 2012-01-26 DIAGNOSIS — R110 Nausea: Secondary | ICD-10-CM | POA: Diagnosis present

## 2012-01-26 DIAGNOSIS — I10 Essential (primary) hypertension: Secondary | ICD-10-CM | POA: Diagnosis present

## 2012-01-26 DIAGNOSIS — Z952 Presence of prosthetic heart valve: Secondary | ICD-10-CM

## 2012-01-26 DIAGNOSIS — Z8774 Personal history of (corrected) congenital malformations of heart and circulatory system: Secondary | ICD-10-CM | POA: Insufficient documentation

## 2012-01-26 DIAGNOSIS — Q2111 Secundum atrial septal defect: Secondary | ICD-10-CM

## 2012-01-26 DIAGNOSIS — E119 Type 2 diabetes mellitus without complications: Secondary | ICD-10-CM | POA: Diagnosis present

## 2012-01-26 DIAGNOSIS — I5032 Chronic diastolic (congestive) heart failure: Secondary | ICD-10-CM | POA: Diagnosis present

## 2012-01-26 DIAGNOSIS — F17200 Nicotine dependence, unspecified, uncomplicated: Secondary | ICD-10-CM | POA: Diagnosis present

## 2012-01-26 DIAGNOSIS — D62 Acute posthemorrhagic anemia: Secondary | ICD-10-CM | POA: Diagnosis not present

## 2012-01-26 HISTORY — DX: Personal history of (corrected) congenital malformations of heart and circulatory system: Z87.74

## 2012-01-26 HISTORY — DX: Presence of prosthetic heart valve: Z95.2

## 2012-01-26 HISTORY — PX: AORTIC VALVE REPLACEMENT: SHX41

## 2012-01-26 LAB — POCT I-STAT 4, (NA,K, GLUC, HGB,HCT)
Glucose, Bld: 102 mg/dL — ABNORMAL HIGH (ref 70–99)
Glucose, Bld: 96 mg/dL (ref 70–99)
Glucose, Bld: 122 mg/dL — ABNORMAL HIGH (ref 70–99)
Glucose, Bld: 162 mg/dL — ABNORMAL HIGH (ref 70–99)
HCT: 34 % — ABNORMAL LOW (ref 36.0–46.0)
HCT: 32 % — ABNORMAL LOW (ref 36.0–46.0)
HCT: 25 % — ABNORMAL LOW (ref 36.0–46.0)
Hemoglobin: 11.6 g/dL — ABNORMAL LOW (ref 12.0–15.0)
Hemoglobin: 8.5 g/dL — ABNORMAL LOW (ref 12.0–15.0)
Hemoglobin: 8.5 g/dL — ABNORMAL LOW (ref 12.0–15.0)
Hemoglobin: 8.8 g/dL — ABNORMAL LOW (ref 12.0–15.0)
Hemoglobin: 9.9 g/dL — ABNORMAL LOW (ref 12.0–15.0)
Potassium: 3.5 mEq/L (ref 3.5–5.1)
Potassium: 3.9 mEq/L (ref 3.5–5.1)
Potassium: 4.7 mEq/L (ref 3.5–5.1)
Potassium: 3.9 mEq/L (ref 3.5–5.1)
Sodium: 136 mEq/L (ref 135–145)
Sodium: 141 mEq/L (ref 135–145)
Sodium: 139 mEq/L (ref 135–145)
Sodium: 139 mEq/L (ref 135–145)

## 2012-01-26 LAB — POCT I-STAT 3, ART BLOOD GAS (G3+)
Acid-base deficit: 2 mmol/L (ref 0.0–2.0)
Acid-base deficit: 1 mmol/L (ref 0.0–2.0)
Acid-base deficit: 3 mmol/L — ABNORMAL HIGH (ref 0.0–2.0)
Acid-base deficit: 4 mmol/L — ABNORMAL HIGH (ref 0.0–2.0)
Bicarbonate: 22.8 mEq/L (ref 20.0–24.0)
O2 Saturation: 100 %
O2 Saturation: 100 %
O2 Saturation: 99 %
Patient temperature: 34.8
TCO2: 25 mmol/L (ref 0–100)
pCO2 arterial: 41.1 mmHg (ref 35.0–45.0)
pCO2 arterial: 37.1 mmHg (ref 35.0–45.0)
pCO2 arterial: 50.1 mmHg — ABNORMAL HIGH (ref 35.0–45.0)
pCO2 arterial: 47.8 mmHg — ABNORMAL HIGH (ref 35.0–45.0)
pH, Arterial: 7.272 — ABNORMAL LOW (ref 7.350–7.400)
pO2, Arterial: 99 mmHg (ref 80.0–100.0)

## 2012-01-26 LAB — GLUCOSE, CAPILLARY
Glucose-Capillary: 122 mg/dL — ABNORMAL HIGH (ref 70–99)
Glucose-Capillary: 147 mg/dL — ABNORMAL HIGH (ref 70–99)
Glucose-Capillary: 145 mg/dL — ABNORMAL HIGH (ref 70–99)

## 2012-01-26 LAB — TYPE AND SCREEN
ABO/RH(D): O POS
Antibody Screen: NEGATIVE

## 2012-01-26 LAB — APTT: aPTT: 33 seconds (ref 24–37)

## 2012-01-26 LAB — CBC
HCT: 31.9 % — ABNORMAL LOW (ref 36.0–46.0)
HCT: 34.6 % — ABNORMAL LOW (ref 36.0–46.0)
Hemoglobin: 12 g/dL (ref 12.0–15.0)
WBC: 13.3 10*3/uL — ABNORMAL HIGH (ref 4.0–10.5)
WBC: 18.8 10*3/uL — ABNORMAL HIGH (ref 4.0–10.5)

## 2012-01-26 LAB — POCT I-STAT, CHEM 8
BUN: 11 mg/dL (ref 6–23)
Chloride: 108 mEq/L (ref 96–112)
Potassium: 4.1 mEq/L (ref 3.5–5.1)
Sodium: 142 mEq/L (ref 135–145)
TCO2: 23 mmol/L (ref 0–100)

## 2012-01-26 LAB — HEMOGLOBIN AND HEMATOCRIT, BLOOD: HCT: 25.3 % — ABNORMAL LOW (ref 36.0–46.0)

## 2012-01-26 LAB — PROTIME-INR: Prothrombin Time: 18.8 seconds — ABNORMAL HIGH (ref 11.6–15.2)

## 2012-01-26 LAB — CREATININE, SERUM
GFR calc Af Amer: >90 mL/min (ref >90)
GFR calc non Af Amer: >90 mL/min (ref >90)

## 2012-01-26 SURGERY — REPLACEMENT, AORTIC VALVE, OPEN
Anesthesia: General | Site: Chest | Wound class: Clean

## 2012-01-26 MED ORDER — ALBUMIN HUMAN 5 % IV SOLN
250.0000 mL | INTRAVENOUS | Status: DC | PRN
  Administered 2012-01-26 (×2): 250 mL via INTRAVENOUS

## 2012-01-26 MED ORDER — PHENYLEPHRINE HCL 10 MG/ML IJ SOLN
20.0000 mg | INTRAVENOUS | Status: DC | PRN
  Administered 2012-01-26: 13.3 ug/min via INTRAVENOUS

## 2012-01-26 MED ORDER — SODIUM CHLORIDE 0.9 % IV SOLN
200.0000 ug | INTRAVENOUS | Status: DC | PRN
  Administered 2012-01-26: 0.2 ug/kg/h via INTRAVENOUS

## 2012-01-26 MED ORDER — CALCIUM CHLORIDE 10 % IV SOLN
1.0000 g | Freq: Once | INTRAVENOUS | Status: AC | PRN
  Filled 2012-01-26: qty 10

## 2012-01-26 MED ORDER — DOCUSATE SODIUM 100 MG PO CAPS
200.0000 mg | ORAL_CAPSULE | Freq: Every day | ORAL | Status: DC
  Administered 2012-01-27 – 2012-02-01 (×6): 200 mg via ORAL
  Filled 2012-01-26 (×7): qty 2

## 2012-01-26 MED ORDER — PHENYLEPHRINE HCL 10 MG/ML IJ SOLN
0.0000 ug/min | INTRAVENOUS | Status: DC
  Filled 2012-01-26: qty 2

## 2012-01-26 MED ORDER — LACTATED RINGERS IV SOLN
INTRAVENOUS | Status: DC
  Administered 2012-01-27: 20 mL/h via INTRAVENOUS

## 2012-01-26 MED ORDER — ONDANSETRON HCL 4 MG/2ML IJ SOLN
4.0000 mg | Freq: Four times a day (QID) | INTRAMUSCULAR | Status: DC | PRN
  Administered 2012-01-26 – 2012-01-29 (×6): 4 mg via INTRAVENOUS
  Filled 2012-01-26: qty 2
  Filled 2012-01-26: qty 4
  Filled 2012-01-26 (×5): qty 2

## 2012-01-26 MED ORDER — MIDAZOLAM HCL 5 MG/5ML IJ SOLN
INTRAMUSCULAR | Status: DC | PRN
  Administered 2012-01-26: 3 mg via INTRAVENOUS
  Administered 2012-01-26: 5 mg via INTRAVENOUS
  Administered 2012-01-26: 2 mg via INTRAVENOUS

## 2012-01-26 MED ORDER — ASPIRIN EC 325 MG PO TBEC
325.0000 mg | DELAYED_RELEASE_TABLET | Freq: Every day | ORAL | Status: DC
  Filled 2012-01-26: qty 1

## 2012-01-26 MED ORDER — VECURONIUM BROMIDE 10 MG IV SOLR
INTRAVENOUS | Status: DC | PRN
  Administered 2012-01-26: 8 mg via INTRAVENOUS
  Administered 2012-01-26: 2 mg via INTRAVENOUS

## 2012-01-26 MED ORDER — ACETAMINOPHEN 650 MG RE SUPP
650.0000 mg | RECTAL | Status: AC
  Administered 2012-01-26: 650 mg via RECTAL

## 2012-01-26 MED ORDER — LACTATED RINGERS IV SOLN
INTRAVENOUS | Status: DC | PRN
  Administered 2012-01-26: 08:00:00 via INTRAVENOUS

## 2012-01-26 MED ORDER — OXYCODONE HCL 5 MG PO TABS
5.0000 mg | ORAL_TABLET | ORAL | Status: DC | PRN
  Administered 2012-01-27: 10 mg via ORAL
  Administered 2012-01-27: 5 mg via ORAL
  Administered 2012-01-28: 10 mg via ORAL
  Administered 2012-01-30 – 2012-02-02 (×7): 5 mg via ORAL
  Filled 2012-01-26 (×6): qty 1
  Filled 2012-01-26 (×2): qty 2
  Filled 2012-01-26 (×3): qty 1

## 2012-01-26 MED ORDER — MORPHINE SULFATE 4 MG/ML IJ SOLN
2.0000 mg | INTRAMUSCULAR | Status: DC | PRN
Start: 2012-01-26 — End: 2012-01-27
  Administered 2012-01-27: 2 mg via INTRAVENOUS
  Administered 2012-01-27: 4 mg via INTRAVENOUS
  Administered 2012-01-27 (×4): 2 mg via INTRAVENOUS
  Filled 2012-01-26 (×4): qty 1

## 2012-01-26 MED ORDER — PROPOFOL 10 MG/ML IV EMUL
INTRAVENOUS | Status: DC | PRN
  Administered 2012-01-26: 100 mg via INTRAVENOUS

## 2012-01-26 MED ORDER — METOPROLOL TARTRATE 1 MG/ML IV SOLN: 2.5000 mg | INTRAVENOUS | Status: DC | PRN

## 2012-01-26 MED ORDER — MORPHINE SULFATE 2 MG/ML IJ SOLN
1.0000 mg | INTRAMUSCULAR | Status: AC | PRN
  Administered 2012-01-26 (×2): 2 mg via INTRAVENOUS
  Administered 2012-01-26 (×2): 1 mg via INTRAVENOUS
  Filled 2012-01-26 (×2): qty 1

## 2012-01-26 MED ORDER — DEXTROSE 5 % IV SOLN
1.5000 g | Freq: Two times a day (BID) | INTRAVENOUS | Status: DC
  Administered 2012-01-26: 1.5 g via INTRAVENOUS
  Filled 2012-01-26 (×3): qty 1.5

## 2012-01-26 MED ORDER — ASPIRIN 81 MG PO CHEW: 324.0000 mg | CHEWABLE_TABLET | Freq: Every day | ORAL | Status: DC

## 2012-01-26 MED ORDER — MIDAZOLAM HCL 2 MG/2ML IJ SOLN: 2.0000 mg | INTRAMUSCULAR | Status: DC | PRN

## 2012-01-26 MED ORDER — SODIUM CHLORIDE 0.9 % IV SOLN
INTRAVENOUS | Status: DC
  Administered 2012-01-26: 10 mL via INTRAVENOUS

## 2012-01-26 MED ORDER — SODIUM CHLORIDE 0.9 % IV SOLN: 250.0000 mL | INTRAVENOUS | Status: DC

## 2012-01-26 MED ORDER — SODIUM CHLORIDE 0.9 % IJ SOLN
OROMUCOSAL | Status: DC | PRN
  Administered 2012-01-26: 10:00:00 via TOPICAL

## 2012-01-26 MED ORDER — PANTOPRAZOLE SODIUM 40 MG PO TBEC
40.0000 mg | DELAYED_RELEASE_TABLET | Freq: Every day | ORAL | Status: DC
  Administered 2012-01-28 – 2012-02-02 (×6): 40 mg via ORAL
  Filled 2012-01-26 (×6): qty 1

## 2012-01-26 MED ORDER — METOPROLOL TARTRATE 12.5 MG HALF TABLET
12.5000 mg | ORAL_TABLET | Freq: Once | ORAL | Status: AC
  Administered 2012-01-26: 12.5 mg via ORAL
  Filled 2012-01-26: qty 1

## 2012-01-26 MED ORDER — MAGNESIUM SULFATE 40 MG/ML IJ SOLN
4.0000 g | Freq: Once | INTRAMUSCULAR | Status: AC
  Administered 2012-01-26: 4 g via INTRAVENOUS
  Filled 2012-01-26: qty 100

## 2012-01-26 MED ORDER — SODIUM CHLORIDE 0.9 % IV SOLN
0.1000 ug/kg/h | INTRAVENOUS | Status: DC
  Filled 2012-01-26: qty 2

## 2012-01-26 MED ORDER — PROTAMINE SULFATE 10 MG/ML IV SOLN
INTRAVENOUS | Status: DC | PRN
  Administered 2012-01-26 (×2): 30 mg via INTRAVENOUS
  Administered 2012-01-26: 20 mg via INTRAVENOUS
  Administered 2012-01-26 (×4): 30 mg via INTRAVENOUS

## 2012-01-26 MED ORDER — METOPROLOL TARTRATE 25 MG/10 ML ORAL SUSPENSION
12.5000 mg | Freq: Two times a day (BID) | ORAL | Status: DC
  Filled 2012-01-26 (×3): qty 5

## 2012-01-26 MED ORDER — NITROGLYCERIN IN D5W 200-5 MCG/ML-% IV SOLN
INTRAVENOUS | Status: DC | PRN
  Administered 2012-01-26: 33.33 ug/min via INTRAVENOUS

## 2012-01-26 MED ORDER — INSULIN REGULAR BOLUS VIA INFUSION
0.0000 [IU] | Freq: Three times a day (TID) | INTRAVENOUS | Status: DC
  Filled 2012-01-26: qty 10

## 2012-01-26 MED ORDER — SODIUM CHLORIDE 0.9 % IV SOLN
10.0000 g | INTRAVENOUS | Status: DC | PRN
  Administered 2012-01-26: 5 g/h via INTRAVENOUS

## 2012-01-26 MED ORDER — FAMOTIDINE IN NACL 20-0.9 MG/50ML-% IV SOLN
20.0000 mg | Freq: Two times a day (BID) | INTRAVENOUS | Status: DC
  Administered 2012-01-26: 20 mg via INTRAVENOUS

## 2012-01-26 MED ORDER — ACETAMINOPHEN 160 MG/5ML PO SOLN: 650.0000 mg | ORAL | Status: AC

## 2012-01-26 MED ORDER — NITROGLYCERIN IN D5W 200-5 MCG/ML-% IV SOLN: 0.0000 ug/min | INTRAVENOUS | Status: DC

## 2012-01-26 MED ORDER — FENTANYL CITRATE 0.05 MG/ML IJ SOLN
INTRAMUSCULAR | Status: DC | PRN
  Administered 2012-01-26: 100 ug via INTRAVENOUS
  Administered 2012-01-26: 25 ug via INTRAVENOUS
  Administered 2012-01-26: 250 ug via INTRAVENOUS
  Administered 2012-01-26: 50 ug via INTRAVENOUS
  Administered 2012-01-26: 250 ug via INTRAVENOUS
  Administered 2012-01-26: 50 ug via INTRAVENOUS
  Administered 2012-01-26: 150 ug via INTRAVENOUS
  Administered 2012-01-26: 325 ug via INTRAVENOUS
  Administered 2012-01-26 (×2): 50 ug via INTRAVENOUS
  Administered 2012-01-26: 250 ug via INTRAVENOUS
  Administered 2012-01-26: 50 ug via INTRAVENOUS
  Administered 2012-01-26: 250 ug via INTRAVENOUS

## 2012-01-26 MED ORDER — BISACODYL 10 MG RE SUPP: 10.0000 mg | Freq: Every day | RECTAL | Status: DC

## 2012-01-26 MED ORDER — VANCOMYCIN HCL 1000 MG IV SOLR
1000.0000 mg | Freq: Once | INTRAVENOUS | Status: AC
  Administered 2012-01-26: 1000 mg via INTRAVENOUS
  Filled 2012-01-26: qty 1000

## 2012-01-26 MED ORDER — METOPROLOL TARTRATE 12.5 MG HALF TABLET
ORAL_TABLET | ORAL | Status: AC
  Administered 2012-01-26: 12.5 mg via ORAL
  Filled 2012-01-26: qty 1

## 2012-01-26 MED ORDER — SODIUM CHLORIDE 0.9 % IV SOLN
100.0000 [IU] | INTRAVENOUS | Status: DC | PRN
  Administered 2012-01-26: 1.3 [IU]/h via INTRAVENOUS

## 2012-01-26 MED ORDER — ROCURONIUM BROMIDE 100 MG/10ML IV SOLN
INTRAVENOUS | Status: DC | PRN
  Administered 2012-01-26 (×2): 50 mg via INTRAVENOUS

## 2012-01-26 MED ORDER — SODIUM CHLORIDE 0.9 % IV SOLN
INTRAVENOUS | Status: DC
  Administered 2012-01-26: 1.2 [IU]/h via INTRAVENOUS
  Administered 2012-01-26: 3.5 [IU]/h via INTRAVENOUS
  Administered 2012-01-26: 2.1 [IU]/h via INTRAVENOUS
  Administered 2012-01-26: 2.7 [IU]/h via INTRAVENOUS
  Administered 2012-01-27: 1.8 [IU]/h via INTRAVENOUS
  Administered 2012-01-27: 2.1 [IU]/h via INTRAVENOUS
  Administered 2012-01-27: 2.4 [IU]/h via INTRAVENOUS
  Filled 2012-01-26: qty 1

## 2012-01-26 MED ORDER — HEPARIN SODIUM (PORCINE) 1000 UNIT/ML IJ SOLN
INTRAMUSCULAR | Status: DC | PRN
  Administered 2012-01-26: 22000 [IU] via INTRAVENOUS

## 2012-01-26 MED ORDER — ACETAMINOPHEN 160 MG/5ML PO SOLN
975.0000 mg | Freq: Four times a day (QID) | ORAL | Status: AC
  Filled 2012-01-26: qty 40.6

## 2012-01-26 MED ORDER — METOPROLOL TARTRATE 12.5 MG HALF TABLET
12.5000 mg | ORAL_TABLET | Freq: Two times a day (BID) | ORAL | Status: DC
  Filled 2012-01-26 (×3): qty 1

## 2012-01-26 MED ORDER — SODIUM CHLORIDE 0.9 % IJ SOLN: 3.0000 mL | Freq: Two times a day (BID) | INTRAMUSCULAR | Status: DC

## 2012-01-26 MED ORDER — ACETAMINOPHEN 500 MG PO TABS
1000.0000 mg | ORAL_TABLET | Freq: Four times a day (QID) | ORAL | Status: AC
  Administered 2012-01-27 – 2012-01-31 (×13): 1000 mg via ORAL
  Filled 2012-01-26 (×17): qty 2

## 2012-01-26 MED ORDER — SODIUM CHLORIDE 0.45 % IV SOLN
INTRAVENOUS | Status: DC
  Administered 2012-01-26: 20 mL/h via INTRAVENOUS

## 2012-01-26 MED ORDER — POTASSIUM CHLORIDE 10 MEQ/50ML IV SOLN
10.0000 meq | INTRAVENOUS | Status: AC
  Administered 2012-01-26 (×3): 10 meq via INTRAVENOUS

## 2012-01-26 MED ORDER — BISACODYL 5 MG PO TBEC
10.0000 mg | DELAYED_RELEASE_TABLET | Freq: Every day | ORAL | Status: DC
  Administered 2012-01-27 – 2012-01-31 (×5): 10 mg via ORAL
  Filled 2012-01-26 (×6): qty 2

## 2012-01-26 MED ORDER — SODIUM CHLORIDE 0.9 % IJ SOLN: 3.0000 mL | INTRAMUSCULAR | Status: DC | PRN

## 2012-01-26 MED ORDER — SODIUM CHLORIDE 0.9 % IR SOLN
Status: DC | PRN
  Administered 2012-01-26: 7000 mL

## 2012-01-26 SURGICAL SUPPLY — 101 items
ADAPTER CARDIO PERF ANTE/RETRO (ADAPTER) ×2 IMPLANT
ANTEGRADE CPLG (MISCELLANEOUS) ×2 IMPLANT
APPLICATOR TIP BIOGLUE STANDRD (MISCELLANEOUS) IMPLANT
ATTRACTOMAT 16X20 MAGNETIC DRP (DRAPES) ×2 IMPLANT
BAG DECANTER FOR FLEXI CONT (MISCELLANEOUS) ×2 IMPLANT
BLADE STERNUM SYSTEM 6 (BLADE) ×2 IMPLANT
BLADE SURG 11 STRL SS (BLADE) ×2 IMPLANT
CANISTER SUCTION 2500CC (MISCELLANEOUS) ×2 IMPLANT
CANNULA FEMORAL ART 14 SM (MISCELLANEOUS) ×2 IMPLANT
CANNULA GUNDRY RCSP 15FR (MISCELLANEOUS) ×4 IMPLANT
CANNULA SOFTFLOW AORTIC 7M21FR (CANNULA) ×2 IMPLANT
CATH CPB KIT OWEN (MISCELLANEOUS) ×2 IMPLANT
CATH HEART VENT LEFT (CATHETERS) ×2 IMPLANT
CATH ROBINSON RED A/P 18FR (CATHETERS) ×12 IMPLANT
CATH THORACIC 36FR (CATHETERS) ×2 IMPLANT
CATH THORACIC 36FR RT ANG (CATHETERS) IMPLANT
CLOTH BEACON ORANGE TIMEOUT ST (SAFETY) ×2 IMPLANT
CONN 3/8X3/8 GISH STERILE (MISCELLANEOUS) ×2 IMPLANT
CONNECTOR 1/2X3/8X1/2 3 WAY (MISCELLANEOUS) ×1
CONNECTOR 1/2X3/8X1/2 3WAY (MISCELLANEOUS) ×1 IMPLANT
CONT SPEC 4OZ CLIKSEAL STRL BL (MISCELLANEOUS) ×2 IMPLANT
CONT SPEC STER OR (MISCELLANEOUS) ×2 IMPLANT
COVER SURGICAL LIGHT HANDLE (MISCELLANEOUS) ×4 IMPLANT
CRADLE DONUT ADULT HEAD (MISCELLANEOUS) ×2 IMPLANT
DERMABOND ADVANCED (GAUZE/BANDAGES/DRESSINGS) ×1
DERMABOND ADVANCED .7 DNX12 (GAUZE/BANDAGES/DRESSINGS) ×1 IMPLANT
DRAIN CHANNEL 32F RND 10.7 FF (WOUND CARE) ×4 IMPLANT
DRAPE INCISE IOBAN 66X45 STRL (DRAPES) ×2 IMPLANT
DRAPE SLUSH/WARMER DISC (DRAPES) ×2 IMPLANT
DRSG COVADERM 4X14 (GAUZE/BANDAGES/DRESSINGS) ×2 IMPLANT
ELECT REM PT RETURN 9FT ADLT (ELECTROSURGICAL) ×4
ELECTRODE REM PT RTRN 9FT ADLT (ELECTROSURGICAL) ×2 IMPLANT
FLUID NSS /IRRIG 3000 ML XXX (IV SOLUTION) ×2 IMPLANT
GLOVE BIO SURGEON STRL SZ 6 (GLOVE) IMPLANT
GLOVE BIO SURGEON STRL SZ 6.5 (GLOVE) ×10 IMPLANT
GLOVE BIO SURGEON STRL SZ7 (GLOVE) ×4 IMPLANT
GLOVE BIO SURGEON STRL SZ7.5 (GLOVE) IMPLANT
GLOVE BIOGEL PI IND STRL 6.5 (GLOVE) ×3 IMPLANT
GLOVE BIOGEL PI IND STRL 7.0 (GLOVE) ×5 IMPLANT
GLOVE BIOGEL PI INDICATOR 6.5 (GLOVE) ×3
GLOVE BIOGEL PI INDICATOR 7.0 (GLOVE) ×5
GLOVE ORTHO TXT STRL SZ7.5 (GLOVE) ×6 IMPLANT
GOWN STRL NON-REIN LRG LVL3 (GOWN DISPOSABLE) ×14 IMPLANT
HEMOSTAT POWDER SURGIFOAM 1G (HEMOSTASIS) ×6 IMPLANT
INSERT FOGARTY XLG (MISCELLANEOUS) ×2 IMPLANT
KIT BASIN OR (CUSTOM PROCEDURE TRAY) ×2 IMPLANT
KIT DILATOR VASC 18G NDL (KITS) ×2 IMPLANT
KIT DRAINAGE VACCUM ASSIST (KITS) ×2 IMPLANT
KIT ROOM TURNOVER OR (KITS) ×2 IMPLANT
KIT SUCTION CATH 14FR (SUCTIONS) ×2 IMPLANT
LEAD PACING MYOCARDI (MISCELLANEOUS) ×2 IMPLANT
LINE VENT (MISCELLANEOUS) ×2 IMPLANT
LOOP VESSEL SUPERMAXI WHITE (MISCELLANEOUS) ×2 IMPLANT
NS IRRIG 1000ML POUR BTL (IV SOLUTION) ×16 IMPLANT
PACK OPEN HEART (CUSTOM PROCEDURE TRAY) ×2 IMPLANT
PAD ARMBOARD 7.5X6 YLW CONV (MISCELLANEOUS) ×4 IMPLANT
SENSOR MYOCARDIAL TEMP (MISCELLANEOUS) ×2 IMPLANT
SET CARDIOPLEGIA MPS 5001102 (MISCELLANEOUS) ×2 IMPLANT
SET IRRIG TUBING LAPAROSCOPIC (IRRIGATION / IRRIGATOR) ×4 IMPLANT
SOLUTION ANTI FOG 6CC (MISCELLANEOUS) ×2 IMPLANT
SPONGE GAUZE 4X4 12PLY (GAUZE/BANDAGES/DRESSINGS) ×2 IMPLANT
SUT BONE WAX W31G (SUTURE) ×2 IMPLANT
SUT ETHIBON 2 0 V 52N 30 (SUTURE) ×4 IMPLANT
SUT ETHIBON EXCEL 2-0 V-5 (SUTURE) ×24 IMPLANT
SUT ETHIBOND 2 0 SH (SUTURE)
SUT ETHIBOND 2 0 SH 36X2 (SUTURE) IMPLANT
SUT ETHIBOND 2 0 V4 (SUTURE) IMPLANT
SUT ETHIBOND 2 0V4 GREEN (SUTURE) IMPLANT
SUT ETHIBOND 4 0 RB 1 (SUTURE) IMPLANT
SUT ETHIBOND NAB MH 2-0 36IN (SUTURE) ×2 IMPLANT
SUT ETHIBOND V-5 VALVE (SUTURE) ×24 IMPLANT
SUT ETHIBOND X763 2 0 SH 1 (SUTURE) ×10 IMPLANT
SUT MNCRL AB 3-0 PS2 18 (SUTURE) ×4 IMPLANT
SUT PDS AB 1 CTX 36 (SUTURE) ×4 IMPLANT
SUT PROLENE 3 0 SH DA (SUTURE) ×8 IMPLANT
SUT PROLENE 4 0 RB 1 (SUTURE) ×5
SUT PROLENE 4 0 SH DA (SUTURE) IMPLANT
SUT PROLENE 4-0 RB1 .5 CRCL 36 (SUTURE) ×5 IMPLANT
SUT PROLENE 5 0 C 1 36 (SUTURE) IMPLANT
SUT PROLENE 6 0 C 1 30 (SUTURE) IMPLANT
SUT SILK  1 MH (SUTURE) ×2
SUT SILK 1 MH (SUTURE) ×2 IMPLANT
SUT SILK 2 0 SH CR/8 (SUTURE) IMPLANT
SUT SILK 3 0 SH CR/8 (SUTURE) IMPLANT
SUT STEEL 6MS V (SUTURE) IMPLANT
SUT STEEL SZ 6 DBL 3X14 BALL (SUTURE) ×2 IMPLANT
SUT VIC AB 2-0 CTX 27 (SUTURE) ×2 IMPLANT
SUT VIC AB 2-0 UR6 27 (SUTURE) ×4 IMPLANT
SUTURE E-PAK OPEN HEART (SUTURE) ×2 IMPLANT
SYR 10ML KIT SKIN ADHESIVE (MISCELLANEOUS) IMPLANT
SYSTEM SAHARA CHEST DRAIN ATS (WOUND CARE) ×2 IMPLANT
TAPE CLOTH SURG 4X10 WHT LF (GAUZE/BANDAGES/DRESSINGS) ×2 IMPLANT
TOWEL OR 17X24 6PK STRL BLUE (TOWEL DISPOSABLE) ×2 IMPLANT
TOWEL OR 17X26 10 PK STRL BLUE (TOWEL DISPOSABLE) ×4 IMPLANT
TRAY FOLEY IC TEMP SENS 14FR (CATHETERS) ×2 IMPLANT
TUBE CONNECTING 12X1/4 (SUCTIONS) ×2 IMPLANT
TUBE SUCT INTRACARD DLP 20F (MISCELLANEOUS) ×2 IMPLANT
UNDERPAD 30X30 INCONTINENT (UNDERPADS AND DIAPERS) ×2 IMPLANT
VALVE AORTIC MAGNA 21MM (Prosthesis & Implant Heart) ×2 IMPLANT
VENT LEFT HEART 12002 (CATHETERS) ×4
WATER STERILE IRR 1000ML POUR (IV SOLUTION) ×4 IMPLANT

## 2012-01-26 NOTE — Procedures (Signed)
Extubation Procedure Note  Patient Details:   Name: Courtney Harvey DOB: Oct 19, 1956 MRN: [id number]   Airway Documentation:     Evaluation  O2 sats: stable throughout Complications: No apparent complications Patient did tolerate procedure well. Bilateral Breath Sounds: Clear Suctioning: Airway Yes  Filbert Schilder  01/26/2012, 11:27 PM  Patient was extubated to a 4 L/M nasal cannula per MD order.  No evidence of stridor present  NIF -77m VC 1.3 L

## 2012-01-26 NOTE — Progress Notes (Signed)
UR Completed.  Courtney Harvey [phone] 01/26/2012  

## 2012-01-26 NOTE — Progress Notes (Signed)
Nursing Note  Patient weaning to extubate.  Patient very lethargic and sleepy with episodes of anxiety and tachypnea.  Following all commands appropriately, NIF nearly -20 and Vital Capacity 1.3L.  Arterial Blood Gas result:  pO2 110; pCO2 50; pH 7.27;  HCO3 23.4, %O2 Sat 98.  ABG drawn on CPAP/PS, results called to Dr. Dorris Fetch. Orders received to rest patient on ventilator and attempt to wean in about two hours.  Plan of care explained to patient and nods understanding.  RT will change vent settings accordingly.  L.Jatavius Ellenwood RN

## 2012-01-26 NOTE — Progress Notes (Signed)
  Echocardiogram Echocardiogram Transesophageal has been performed.  Jorje Guild [facility] 01/26/2012, 10:18 AM

## 2012-01-26 NOTE — Op Note (Signed)
CARDIOTHORACIC SURGERY OPERATIVE NOTE  Date of Procedure: 01/26/2012  Preoperative Diagnosis:   Severe Aortic Stenosis with Moderate Aortic Insufficiency  Patent Foramen Ovale  Postoperative Diagnosis:   Same   Procedure:   Aortic Valve Replacement  Edwards Magna Ease Pericardial Tissue Valve (size 21mm, model #3300TFX, serial [id number])  Closure of Patent Foramen Ovale    Surgeon: Salvatore Decent. Cornelius Moras, MD  Assistant: Benay Spice, CRNA  Anesthesia: Raiford Simmonds, MD  Operative Findings:  Bicuspid aortic valve with severe aortic stenosis and moderate aortic insufficiency   Large patent foramen ovale   Normal LV systolic function   Mild LV hypertrophy    BRIEF CLINICAL NOTE AND INDICATIONS FOR SURGERY  [name] from [place] with known history of aortic stenosis discovered in 2005 at which time she underwent both TEE and cardiac catheterization for atypical chest pain.  She was found to have moderate aortic stenosis and moderate aortic regurgitation without significant coronary artery disease. She has not been seen by a cardiologist again until recently. She describes a several month history of progressive exertional shortness of breath. She is recently began to develop increasing symptoms of atypical chest pain described as a constant tightness across her left chest with intermittent episodes of sharp pain across her left chest that did not seem to necessarily be related to physical activity. The patient also reports some occasional dizzy spells without syncope. Exertional shortness of breath has progressed and she now occasionally get short of breath with minimal activity or at rest. She denies PND, orthopnea, or lower extremity edema. She was admitted to [facility] in [place] and underwent transthoracic echocardiogram that reportedly demonstrates severe aortic stenosis with moderate aortic insufficiency and preserved left  ventricular function. She was transferred to [facility], where she underwent cardiac catheterization yesterday demonstrating normal coronary artery anatomy with no significant coronary artery disease. Her thoracic surgical consultation has been requested.  The patient has been seen in consultation and counseled at length regarding the indications, risks and potential benefits of surgery.  All questions have been answered, and the patient provides full informed consent for the operation as described.     DETAILS OF THE OPERATIVE PROCEDURE  The patient is brought to the operating room on the above mentioned date and central monitoring was established by the anesthesia team including placement of Swan-Ganz catheter and radial arterial line. The patient is placed in the supine position on the operating table.  Intravenous antibiotics are administered. General endotracheal anesthesia is induced uneventfully. A Foley catheter is placed.  Baseline transesophageal echocardiogram was performed.  Findings were notable for bicuspid native aortic valve with severe aortic stenosis and moderate aortic insufficiency.  There was normal LV systolic function.  The patient's chest, abdomen, both groins, and both lower extremities are prepared and draped in a sterile manner. A time out procedure is performed.  A median sternotomy incision was performed and the pericardium is opened. The ascending aorta is normal in appearance. The ascending aorta is cannulated for cardioplegia bypass.  The right common femoral vein is cannulated with Seldinger technique and a flexible guidewire advanced under TEE guidance into the right atrium. The right common femoral vein is cannulated with a 22 French long femoral venous cannula.  Adequate heparinization is verified.   A retrograde cardioplegia cannula is placed through the right atrium into the coronary sinus.  A second venous cannula is placed through the right atrium into the superior  vena cava.  The entire pre-bypass portion of the operation was notable for stable hemodynamics.  Cardiopulmonary bypass was begun and the surface of the heart is inspected.  A cardioplegia cannula is placed in the ascending aorta.  A temperature probe was placed in the interventricular septum.  The patient is cooled to 32C systemic temperature.  The aortic cross clamp is applied and cold blood cardioplegia is delivered initially in an antegrade fashion through the aortic root.   Supplemental cardioplegia is given retrograde through the coronary sinus catheter.  Iced saline slush is applied for topical hypothermia.  The initial cardioplegic arrest is rapid with early diastolic arrest.  Repeat doses of cardioplegia are administered intermittently throughout the entire cross clamp portion of the operation through the aortic root and through the coronary sinus catheter in order to maintain completely flat electrocardiogram and septal myocardial temperature below 15C.  Myocardial protection was felt to be excellent.  An oblique transverse aortotomy incision was performed.  The aortic valve was inspected and notable for bicuspid valve with fusion of the raphe between the right and non-coronary leaflets of the valve with severe aortic stenosis.  The aortic valve leaflets were excised sharply and the aortic annulus decalcified.  Decalcification was notably straightforward.  The aortic annulus was sized to accept a 21 mm prosthesis.  The aortic root and left ventricle were irrigated with copious cold saline solution.  Aortic valve replacement was performed using interrupted horizontal mattress 2-0 Ethibond pledgeted sutures with pledgets in the subannular position.  An [facility] Ease pericardial tissue valve (size 21 mm, model # 3300TFX, serial # [id number]) was implanted uneventfully. The valve seated appropriately with adequate space beneath the left main and right coronary artery.  The aortotomy was closed  using a 2-layer closure of running 4-0 Prolene suture.  A small oblique right atriotomy incision is performed after pulling the long venous cannula back until the tip was just below the junction with the right atrium. There is an obvious patent foramen ovale which is closed using running 4-0 Prolene suture.  One final dose of warm retrograde "hot shot" cardioplegia was administered retrograde through the coronary sinus catheter while all air was evacuated through the aortic root.  The aortic cross clamp was removed after a total cross clamp time of 71 minutes.  Epicardial pacing wires are fixed to the right ventricular outflow tract and to the right atrial appendage. The patient is rewarmed to 37C temperature. The aortic and left ventricular vents are removed.  The patient is weaned and disconnected from cardiopulmonary bypass.  The patient's rhythm at separation from bypass was AV paced.  The patient was weaned from cardioplegic bypass without any inotropic support. Total cardiopulmonary bypass time for the operation was 113 minutes.  Followup transesophageal echocardiogram performed after separation from bypass revealed a well-seated aortic valve prosthesis that was functioning normally and without any sign of perivalvular leak.  Left ventricular function was unchanged from preoperatively.  The aortic and superior vena caval venous cannula were removed uneventfully. Protamine was administered to reverse the anticoagulation. The femoral venous cannula was removed and manual pressure held on the right groin.  The mediastinum and pleural space were inspected for hemostasis and irrigated with saline solution. The mediastinum and the right pleural space were drained using 3 chest tubes placed through separate stab incisions inferiorly.  The soft tissues anterior to the aorta were reapproximated loosely. The sternum is closed with double strength sternal wire. The soft tissues anterior to the sternum were  closed  in multiple layers and the skin is closed with a running subcuticular skin closure.  The post-bypass portion of the operation was notable for stable rhythm and hemodynamics.  The patient resumed normal sinus rhythm spontaneously.   No blood products were administered during the operation.  The patient tolerated the procedure well and is transported to the surgical intensive care in stable condition. There are no intraoperative complications. All sponge instrument and needle counts are verified correct at completion of the operation.    Salvatore Decent. Cornelius Moras MD 01/26/2012 2:28 PM

## 2012-01-26 NOTE — Anesthesia Preprocedure Evaluation (Addendum)
Anesthesia Evaluation  Patient identified by MRN, date of birth, ID band Patient awake    Reviewed: Allergy & Precautions, H&P , NPO status , Patient's Chart, lab work & pertinent test results  History of Anesthesia Complications Negative for: history of anesthetic complications  Airway Mallampati: I TM Distance: >3 FB Neck ROM: Full    Dental  (+) Partial Lower   Pulmonary Current Smoker,  breath sounds clear to auscultation        Cardiovascular hypertension, Pt. on medications + Valvular Problems/Murmurs AS and AI Rhythm:Regular Rate:Normal     Neuro/Psych  Headaches,    GI/Hepatic hiatal hernia,   Endo/Other    Renal/GU      Musculoskeletal   Abdominal   Peds  Hematology   Anesthesia Other Findings   Reproductive/Obstetrics                          Anesthesia Physical Anesthesia Plan  ASA: III  Anesthesia Plan: General   Post-op Pain Management:    Induction: Intravenous  Airway Management Planned: Oral ETT  Additional Equipment: Arterial line, CVP, PA Cath, 3D TEE and Ultrasound Guidance Line Placement  Intra-op Plan:   Post-operative Plan: Post-operative intubation/ventilation  Informed Consent: I have reviewed the patients History and Physical, chart, labs and discussed the procedure including the risks, benefits and alternatives for the proposed anesthesia with the patient or authorized representative who has indicated his/her understanding and acceptance.   Dental advisory given  Plan Discussed with: Surgeon  Anesthesia Plan Comments:         Anesthesia Quick Evaluation

## 2012-01-26 NOTE — OR Nursing (Signed)
Call placed to volunteers desk for family at removal of retractor at 12:56. Call placed to unit 2300 at removal of retractor at 12:56, skin suture close at 13:17 and patient exit.

## 2012-01-26 NOTE — Progress Notes (Signed)
Patient ID: Courtney Harvey, female   DOB: 1956/09/25, 56 y.o.   MRN: [id number] Intubated sedated BP 100/79  Pulse 80  Temp(Src) 96.8 F (36 C) (Other (Comment))  Resp 13  Wt 158 lb (71.668 kg)  SpO2 98% CI= 2.77 Minimal CT output Doing well early postop

## 2012-01-26 NOTE — Transfer of Care (Signed)
Immediate Anesthesia Transfer of Care Note  Patient: Courtney Harvey  Procedure(s) Performed: Procedure(s) (LRB): AORTIC VALVE REPLACEMENT (AVR) (N/A) REPAIR OF PATENT FORAMEN OVALE (N/A)  Patient Location: SICU  Anesthesia Type: General  Level of Consciousness: Patient remains intubated per anesthesia plan  Airway & Oxygen Therapy: Patient remains intubated per anesthesia plan and Patient placed on Ventilator (see vital sign flow sheet for setting)  Post-op Assessment: Report given to PACU RN and Post -op Vital signs reviewed and stable  Post vital signs: Reviewed and stable  Complications: No apparent anesthesia complications

## 2012-01-26 NOTE — Brief Op Note (Signed)
01/26/2012  1:26 PM  PATIENT:  Courtney Harvey  56 y.o. female  PRE-OPERATIVE DIAGNOSIS:  aortic stenosis  POST-OPERATIVE DIAGNOSIS:  aortic stenosis  PROCEDURE:  Procedure(s) (LRB): AORTIC VALVE REPLACEMENT (AVR) (21mm Edwards Magna Ease pericardial tissue valve) REPAIR OF PATENT FORAMEN OVALE (N/A)  SURGEON:    Purcell Nails, MD  ASSISTANTS:  Benay Spice, CRNA  ANESTHESIA:   Raiford Simmonds, MD  CROSSCLAMP TIME:   9' CARDIOPULMONARY BYPASS TIME: 113'  FINDINGS:  Bicuspid aortic valve with severe aortic stenosis and moderate aortic insufficiency  Large patent foramen ovale  Normal LV systolic function  Mild LV hypertrophy  COMPLICATIONS: none  PATIENT DISPOSITION:   TO SICU IN STABLE CONDITION  [name] 01/26/2012 1:26 PM

## 2012-01-26 NOTE — Anesthesia Procedure Notes (Signed)
Procedure Name: Intubation Date/Time: 01/26/2012 8:54 AM Performed by: Fuller [place] Pre-anesthesia Checklist: Patient identified, Emergency Drugs available, Suction available, Patient being monitored and Timeout performed Patient Re-evaluated:Patient Re-evaluated prior to inductionOxygen Delivery Method: Circle system utilized Preoxygenation: Pre-oxygenation with 100% oxygen Intubation Type: IV induction Ventilation: Mask ventilation without difficulty Laryngoscope Size: Mac and 3 Grade View: Grade II Tube type: Oral Tube size: 8.0 mm Number of attempts: 1 Airway Equipment and Method: Stylet Placement Confirmation: ETT inserted through vocal cords under direct vision,  positive ETCO2 and breath sounds checked- equal and bilateral Secured at: 23 cm Tube secured with: Tape Dental Injury: Teeth and Oropharynx as per pre-operative assessment

## 2012-01-26 NOTE — H&P (Signed)
CARDIOTHORACIC SURGERY HISTORY AND PHYSICAL EXAM  PCP is TAPPER,DAVID B, MD, MD Referring Provider is Courtney Haws, MD   Reason for consultation:  Severe aortic stenosis  HPI:  [name] from [facility] with known history of aortic stenosis discovered in 2005 at which time she underwent both TEE and cardiac catheterization for atypical chest pain.  She was found to have moderate aortic stenosis and moderate aortic regurgitation without significant coronary artery disease. She has not been seen by a cardiologist again until recently. She describes a several month history of progressive exertional shortness of breath. She is recently began to develop increasing symptoms of atypical chest pain described as a constant tightness across her left chest with intermittent episodes of sharp pain across her left chest that did not seem to necessarily be related to physical activity. The patient also reports some occasional dizzy spells without syncope. Exertional shortness of breath has progressed and she now occasionally get short of breath with minimal activity or at rest. She denies PND, orthopnea, or lower extremity edema. She was admitted to [facility] in [place] and underwent transthoracic echocardiogram that reportedly demonstrates severe aortic stenosis with moderate aortic insufficiency and preserved left ventricular function. She was transferred to [facility], where she underwent cardiac catheterization yesterday demonstrating normal coronary artery anatomy with no significant coronary artery disease. Her thoracic surgical consultation has been requested.   Past Medical History  Diagnosis Date  . Heart murmur   . Shortness of breath   . Recurrent upper respiratory infection (URI)   . Headache   . H/O hiatal hernia   . Dysrhythmia   . Aortic insufficiency and aortic stenosis   . Chronic diastolic heart failure   . Hypertension 01/11/2012    . Tobacco abuse     Past Surgical History  Procedure Date  . Tee without cardioversion 01/10/2012    Procedure: TRANSESOPHAGEAL ECHOCARDIOGRAM (TEE);  Surgeon: Pricilla Riffle, MD;  Location: [facility] ENDOSCOPY;  Service: Cardiovascular;  Laterality: N/A;  TEE call Trish in am for time  . Cardiac catheterization 2005 and 2013    Friday March 8th       pcp Dr tapper  in [place]         Family History  Problem Relation Age of Onset  . Lung cancer Father   . Uterine cancer Mother     Social History History  Substance Use Topics  . Smoking status: Former Smoker -- 1.0 packs/day for 40 years    Types: Cigarettes    Quit date: 01/06/2012  . Smokeless tobacco: Never Used  . Alcohol Use: No    Current Facility-Administered Medications  Medication Dose Route Frequency Provider Last Rate Last Dose  . aminocaproic acid (AMICAR) 10 g in sodium chloride 0.9 % 100 mL infusion   Intravenous To OR Purcell Nails, MD      . cefUROXime (ZINACEF) 1.5 g in dextrose 5 % 50 mL IVPB  1.5 g Intravenous To OR Purcell Nails, MD      . cefUROXime (ZINACEF) 750 mg in dextrose 5 % 50 mL IVPB  750 mg Intravenous To OR Purcell Nails, MD      . dexmedetomidine (PRECEDEX) 400 mcg in sodium chloride 0.9 % 100 mL infusion  0.1-0.7 mcg/kg/hr Intravenous To OR Purcell Nails, MD      . DOPamine (INTROPIN) 800 mg in dextrose 5 % 250 mL infusion  2-20 mcg/kg/min Intravenous To OR Purcell Nails, MD      . EPINEPHrine (ADRENALIN) 4,000 mcg in dextrose 5 % 250 mL infusion  0.5-20 mcg/min Intravenous To OR Purcell Nails, MD      . insulin regular (NOVOLIN R,HUMULIN R) 1 Units/mL in sodium chloride 0.9 % 100 mL infusion   Intravenous To OR Purcell Nails, MD      . magnesium sulfate (IV Push/IM) injection 40 mEq  40 mEq Other To OR Purcell Nails, MD      . metoprolol tartrate (LOPRESSOR) tablet 12.5 mg  12.5 mg Oral Once Purcell Nails, MD   12.5 mg at 01/26/12 7829  . nitroGLYCERIN 0.2 mg/mL in dextrose 5 % infusion   2-200 mcg/min Intravenous To OR Purcell Nails, MD      . nitroglycerin/verapamil/heparin/sodium bicarbonate solution irrigation for artery spasm   Irrigation To OR Purcell Nails, MD      . phenylephrine (NEO-SYNEPHRINE) 20,000 mcg in dextrose 5 % 250 mL infusion  30-200 mcg/min Intravenous To OR Purcell Nails, MD      . potassium chloride injection 80 mEq  80 mEq Other To OR Purcell Nails, MD      . vancomycin (VANCOCIN) 1,250 mg in sodium chloride 0.9 % 250 mL IVPB  1,250 mg Intravenous To OR Purcell Nails, MD       Facility-Administered Medications Ordered in Other Encounters  Medication Dose Route Frequency Provider Last Rate Last Dose  . fentaNYL (SUBLIMAZE) injection    PRN Eyvonne Mechanic Maness, CRNA   50 mcg at 01/26/12 5621    Allergies  Allergen Reactions  . Peanut-Containing Drug Products Other (See Comments)    headache  . Wellbutrin (Bupropion Hcl)     itching    Review of Systems:             General:                      normal appetite, normal energy, no recent weight gain nor weight loss             Respiratory:                no cough, no wheezing, no hemoptysis, no pain with inspiration or cough, + shortness of breath, + smoking             Cardiac:                       + chest pain or tightness, + exertional SOB, no resting SOB, no PND, no orthopnea, no LE edema, no palpitations, no syncope, + dizzy spells             GI:                                no difficulty swallowing, no hematochezia, no hematemesis, no melena, no constipation, no diarrhea               GU:                              no dysuria, no urgency, no frequency               Musculoskeletal:         no arthritis, no arthralgia  Vascular:                     no pain suggestive of claudication               Neuro:                         no symptoms suggestive of TIA's, no seizures, no headaches, no peripheral neuropathy               Endocrine:                   Negative                HEENT:                       no loose teeth or painful teeth, hasn't seen a dentist in several years, partial lower plate dentures, no recent vision changes             Psych:                         no anxiety, no depression                Physical Exam:              BP 123/77  Pulse 75  Temp(Src) 98 F (36.7 C) (Oral)  Resp 18  Wt 71.668 kg (158 lb)  SpO2 96%             General:                      well-appearing             HEENT:                       Unremarkable               Neck:                           no JVD, no bruits, no adenopathy               Chest:                         clear to auscultation, symmetrical breath sounds, no wheezes, no rhonchi               CV:                              RRR, grade III/VI systolic murmur               Abdomen:                    soft, non-tender, no masses               Extremities:                 warm, well-perfused, pulses palpable             Rectal/GU                   Deferred             Neuro:  Grossly non-focal and symmetrical throughout             Skin:                            Clean and dry, no rashes, no breakdown  Diagnostic Tests:  Report of 2D echocardiogram performed at [facility] reviewed (films not currently available) notable for severe aortic stenosis with peak flow of severe across the aortic valve measured 4.05 cm/s and peak and mean transvalvular gradients estimated 66 and 38 mm mercury respectively. Left ventricular output tract diameter was measured 19 mm. Aortic valve was notably heavily calcified and sclerotic with severely restricted leaflet mobility. Aortic valve looked to be likely bicuspid. There was moderate to severe aortic insufficiency. Left ventricular systolic function was normal with ejection fraction estimated 55-60%. There was mild left ventricular hypertrophy and mild mitral regurgitation.   Cardiac Catheterization Report    Coronary Arteries:   Left  dominant with no anomalies   LM: normal   LAD: Normal  D1: normal   D2 normal   Circumflex: Dominant and normal   OM1: normal   OM2: normal   PLB: 3 of them normal   PDA: normal   RCA: normal  Ventriculography: EF: 60 %, no RWMA;s as seen after aortic root injection with severe AR   Aortography: Severe AR and limited motion of AV. Aortic root is calcified and dilated   Hemodynamics:   Aortic Pressure: 129 74 mmHg MRA: 2 mmHg   RV: 24/3 mmHg   PA: 19/4 mean 12 mmHg   PCWP: Mean 12 mmHg   CO: 4.94 L/Min CI: 2.74 L/Min/M2   Courtney Harvey   01/07/2012   3:52 PM  Transesophageal Echocardiography  Patient:    Courtney Harvey, Courtney Harvey MR #:       [id number] Study Date: 01/10/2012 ------------------------------------------------------------ Study Conclusions  - Aortic valve: AV appears dysplastic. There is   calcification of right and noncoronary cusps with partial   fusion. There is moderately restricted opening (could not   get gradient). There is at least moderate insufficiency. - Left atrium: No evidence of thrombus in the atrial cavity   or appendage. - Atrial septum: Large PFO as tested with injection of   agitated saline. ------------------------------------------------------------ ------------------------------------------------------------ Left ventricle:  LV systolic function is normal. ------------------------------------------------------------ Aortic valve:  AV appears dysplastic. There is calcification of right and noncoronary cusps with partial fusion. There is moderately restricted opening (could not get gradient). There is at least moderate insufficiency. ------------------------------------------------------------ Aorta:  Aortic root is normal in size. Aortic root and proximal ascending aorta are normal in size. ------------------------------------------------------------ Mitral valve:  MV is normal. Trace  MR ------------------------------------------------------------ Left atrium:   No evidence of thrombus in the atrial cavity or appendage. ------------------------------------------------------------ Atrial septum:  Large PFO as tested with injection of agitated saline. ------------------------------------------------------------ Tricuspid valve:  TV is normal. Trace TR. ------------------------------------------------------------ Post procedure conclusions Ascending Aorta: - Aortic root is normal in size. Aortic root and proximal   ascending aorta are normal in size. ------------------------------------------------------------ Prepared and Electronically Authenticated by  Dietrich Pates, MD 2013-03-11T19:29:31.657   CARDIAC CT CHEST   Mediastinum: Cardiac findings will be dictated separately.  There is mild prominence of the ascending thoracic aorta and isthmus of the aorta (both 3.7 cm in diameter).  Within the visualized portions of the thorax and there are no pathologically enlarged mediastinal or hilar lymph nodes.   Lungs/Pleura: Within the visualized thorax there is  a background of mild paraseptal emphysema with bilateral apical blebs.  No focal airspace consolidation.  No pleural effusions.  No definite suspicious appearing pulmonary nodules or masses identified within the visualized lung parenchyma.   Upper Abdomen: Visualized portions are unremarkable.   Musculoskeletal: There are no aggressive appearing lytic or blastic lesions noted in the visualized portions of the skeleton.   IMPRESSION: 1.  Mild paraseptal emphysema. 2.  Mild prominence of the ascending thoracic aorta and isthmus of the aorta, both measuring 3.7 cm in diameter.   Cardiac CT:   Findings:   Cornary Arteries:   Left dominant with no anomalies.  LM origin is very shallow in the sinus see below.   LM-normal   LAD-normal mild calcification proximally         D1-normal       D2-normal    Circumflex- large dominant vessel normal       OM1- small and normal       OM2-large and normal   There were 3 Posterolateral branches and one PDA normal   RCA- nondominant and normal   Aortic Valve:  See also TEE report.  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  This may make right minithoracotomy challenging.   Aortic Root:   Coronal measurements:   Sinus of Valsalva: 33.1mm Sinotubular Junction: 26.3 mm Ascending Aorta at 4cm from valve plane 31.3 mm Ascending Aorta at RPA: 34.7   The LM is shallow only 7.2 mm form valve plane.  RCA ostia is normal at 14.6 mm from valve plane.   There is mild calcification of the inner curvature of the ascending aorta.  There is dense calcium at the origin of the innominate   The great vessels arise normally from the arch with no plaque or aneurysm   Virtual Basal Plane   Max/Min: 23.16mm/16.3 mm average 19.33mm Area: 354.2 sqmm derived diameter 21 mm Perimiter: 76.6 mm   Impression:      1)    Normal left dominant coronary arteries with mild       calcification of proximal LAD         2)    Normal ascending aortic root with no aneurysm/plaque and mild calcification of the inner curvature.  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 4)    Valve plane is low and to the right of the sternum with horizontal aortic root Will discuss with Dr Cornelius Moras regarding feasibility of mini right thoracotomy 5)    Shallow LM ostium only 7.2 mm from valve plane 6)    From a surgical perpective patient also has a large PFO which may benefit from suture closure at the time of AVR Cc: Dr Elzie Rings  and Dr Tressie Stalker   CT  ANGIOGRAPHY ABDOMEN AND PELVIS WITH CONTRAST AND WITHOUT CONTRAST   Technique:  Multidetector CT imaging of the abdomen was performed using the standard protocol during bolus administration of intravenous contrast. Multiplanar reconstructed images including MIPs were obtained and reviewed to evaluate the vascular anatomy.   Comparison: No priors.   Findings:   Lung Bases: Unremarkable.   Abdomen/Pelvis:  The visualized portions of the liver, gallbladder,  pancreas, spleen, bilateral adrenal glands and bilateral kidneys is unremarkable.  There is no ascites or pneumoperitoneum and no definite pathologic distension of bowel within the visualized portions of the abdomen or pelvis.  No definite pathologic lymphadenopathy noted.  Appendix is normal.  Visualized portions of the uterus and ovaries and superior aspect of the urinary bladder are unremarkable.   Musculoskeletal: There are no aggressive appearing lytic or blastic lesions noted in the visualized portions of the skeleton.  Advanced multilevel degenerative disc disease, most severe at L4-L5 where there is near complete loss of intervertebral disc space, with associated vacuum disc phenomenon and advanced discogenic changes of the adjacent vertebral body endplates.   VASCULAR MEASUREMENTS PERTINENT TO PLANNED VALVULAR SURGERY:   AORTA:   Minimal Aortic Diameter -  17 x 16 mm   Severity of Aortic Calcification -  mild   RIGHT PELVIS:   Right Common Iliac Artery -   Minimal Diameter - 9.7 x 7.3 mm   Tortuosity - mild   Calcification - mild   Right External Iliac Artery -   Minimal Diameter - 8.7 x 8.0 mm   Tortuosity - mild   Calcification - mild   Right Common Femoral Artery -   Minimal Diameter - 9.6 x 8.4 mm   Tortuosity - none   Calcification - minimal   LEFT PELVIS:   Left Common Iliac Artery -   Minimal Diameter - 9.1 x 9.7 mm   Tortuosity - mild   Calcification - mild   Left External  Iliac Artery -   Minimal Diameter - 7.6 x 7.3 mm   Tortuosity - mild   Calcification - mild   Left Common Femoral Artery -   Minimal Diameter - 8.6 x 7.6 mm   Tortuosity - none   Calcification - minimal   IMPRESSION: 1.  Arterial findings and measurements pertinent to planned to minimally invasive the aortic valve replacement, as detailed above. 2.  No acute findings in the visualized abdomen or pelvis. 3.  Multilevel degenerative disc disease, most severe at L4-L5, as detailed above.   Original Report Authenticated By: Florencia Reasons, M.D.   Impression:  Severe symptomatic aortic stenosis with relatively small diameter aortic root, preserved left ventricular systolic function, and patent foramen ovale.  Plan:  The patient has been counseled at length regarding surgical alternatives with respect to valve replacement.   In particular, discussion was held comparing in contrast and the risks of mechanical valve replacement and the need for lifelong anticoagulation versus use of a bioprosthetic tissue valve and the associated potential for late structural valve deterioration in failure. Other alternatives including the Ross autograft procedure, homograft aortic root replacement, stentless bioprosthetic tissue valve replacement, and valve repair were discussed.  This discussion was placed in the context of the patient's particular circumstances, and as a result the patient specifically requests that their valve be replaced using some type of tissue valve in order to avoid coumadin therapy.  She understand and accept all potential associated risks of surgery including but not limited to risk of death, stroke, myocardial infarction, congestive heart failure, respiratory failure, renal failure, pneumonia, bleeding requiring blood transfusion and or reexploration, arrhythmia, heart block or bradycardia requiring permanent pacemaker, pleural effusions or other delayed complications related  to continued congestive heart failure, or other late complications related to valve replacement.  The patient states that she will accept transfusion of human blood in the event that she develops severe acute blood loss anemia during or  immediately following surgery. She hopes to avoid the need for blood transfusion, but after thinking about it she admits that she would rather accept a blood transfusion and suffer any potentially serious sequelae or complications related to severe anemia or coagulopathy.   All questions answered.    Salvatore Decent. Cornelius Moras, MD

## 2012-01-27 ENCOUNTER — Inpatient Hospital Stay (HOSPITAL_COMMUNITY): Payer: PRIVATE HEALTH INSURANCE

## 2012-01-27 ENCOUNTER — Encounter (HOSPITAL_COMMUNITY): Payer: Self-pay | Admitting: Thoracic Surgery (Cardiothoracic Vascular Surgery)

## 2012-01-27 LAB — BASIC METABOLIC PANEL
BUN: 13 mg/dL (ref 6–23)
Calcium: 8.6 mg/dL (ref 8.4–10.5)
Creatinine, Ser: 0.58 mg/dL (ref 0.50–1.10)
GFR calc Af Amer: >90 mL/min (ref >90)
GFR calc non Af Amer: >90 mL/min (ref >90)
Glucose, Bld: 109 mg/dL — ABNORMAL HIGH (ref 70–99)
Potassium: 4.1 mEq/L (ref 3.5–5.1)

## 2012-01-27 LAB — GLUCOSE, CAPILLARY
Glucose-Capillary: 108 mg/dL — ABNORMAL HIGH (ref 70–99)
Glucose-Capillary: 104 mg/dL — ABNORMAL HIGH (ref 70–99)
Glucose-Capillary: 106 mg/dL — ABNORMAL HIGH (ref 70–99)
Glucose-Capillary: 109 mg/dL — ABNORMAL HIGH (ref 70–99)
Glucose-Capillary: 111 mg/dL — ABNORMAL HIGH (ref 70–99)
Glucose-Capillary: 112 mg/dL — ABNORMAL HIGH (ref 70–99)
Glucose-Capillary: 113 mg/dL — ABNORMAL HIGH (ref 70–99)
Glucose-Capillary: 118 mg/dL — ABNORMAL HIGH (ref 70–99)
Glucose-Capillary: 127 mg/dL — ABNORMAL HIGH (ref 70–99)
Glucose-Capillary: 130 mg/dL — ABNORMAL HIGH (ref 70–99)
Glucose-Capillary: 106 mg/dL — ABNORMAL HIGH (ref 70–99)

## 2012-01-27 LAB — POCT I-STAT 3, ART BLOOD GAS (G3+)
TCO2: 23 mmol/L (ref 0–100)
pCO2 arterial: 47.5 mmHg — ABNORMAL HIGH (ref 35.0–45.0)
pH, Arterial: 7.267 — ABNORMAL LOW (ref 7.350–7.400)

## 2012-01-27 LAB — CBC
HCT: 32.9 % — ABNORMAL LOW (ref 36.0–46.0)
MCH: 31.2 pg (ref 26.0–34.0)
MCHC: 34 g/dL (ref 30.0–36.0)
MCV: 91.6 fL (ref 78.0–100.0)
RDW: 13.1 % (ref 11.5–15.5)

## 2012-01-27 MED ORDER — SODIUM CHLORIDE 0.9 % IV SOLN: 250.0000 mL | INTRAVENOUS | Status: DC | PRN

## 2012-01-27 MED ORDER — MOVING RIGHT ALONG BOOK
Freq: Once | Status: AC
  Administered 2012-01-27: 10:00:00
  Filled 2012-01-27: qty 1

## 2012-01-27 MED ORDER — MIDAZOLAM HCL 2 MG/2ML IJ SOLN
2.0000 mg | Freq: Once | INTRAMUSCULAR | Status: DC
  Filled 2012-01-27: qty 2

## 2012-01-27 MED ORDER — POTASSIUM CHLORIDE CRYS ER 20 MEQ PO TBCR
20.0000 meq | EXTENDED_RELEASE_TABLET | Freq: Every day | ORAL | Status: DC
  Administered 2012-01-28 – 2012-02-02 (×6): 20 meq via ORAL
  Filled 2012-01-27 (×7): qty 1

## 2012-01-27 MED ORDER — ASPIRIN EC 325 MG PO TBEC
325.0000 mg | DELAYED_RELEASE_TABLET | Freq: Every day | ORAL | Status: DC
  Administered 2012-01-27 – 2012-02-02 (×7): 325 mg via ORAL
  Filled 2012-01-27 (×7): qty 1

## 2012-01-27 MED ORDER — FOLIC ACID 800 MCG PO TABS: 800.0000 ug | ORAL_TABLET | Freq: Every day | ORAL | Status: DC

## 2012-01-27 MED ORDER — MORPHINE SULFATE 2 MG/ML IJ SOLN
2.0000 mg | INTRAMUSCULAR | Status: DC | PRN
  Administered 2012-01-27 (×3): 2 mg via INTRAVENOUS
  Filled 2012-01-27 (×3): qty 1

## 2012-01-27 MED ORDER — TRAMADOL HCL 50 MG PO TABS
50.0000 mg | ORAL_TABLET | ORAL | Status: DC | PRN
  Administered 2012-01-27 – 2012-01-29 (×6): 100 mg via ORAL
  Filled 2012-01-27 (×6): qty 2

## 2012-01-27 MED ORDER — CHLORHEXIDINE GLUCONATE 0.12 % MT SOLN
15.0000 mL | Freq: Two times a day (BID) | OROMUCOSAL | Status: DC
  Administered 2012-01-27 – 2012-02-02 (×12): 15 mL via OROMUCOSAL
  Filled 2012-01-27 (×13): qty 15

## 2012-01-27 MED ORDER — FERROUS SULFATE 325 (65 FE) MG PO TABS
325.0000 mg | ORAL_TABLET | Freq: Two times a day (BID) | ORAL | Status: DC
  Administered 2012-01-30 – 2012-02-02 (×7): 325 mg via ORAL
  Filled 2012-01-27 (×8): qty 1

## 2012-01-27 MED ORDER — FUROSEMIDE 40 MG PO TABS
40.0000 mg | ORAL_TABLET | Freq: Every day | ORAL | Status: DC
  Administered 2012-01-28 – 2012-02-02 (×6): 40 mg via ORAL
  Filled 2012-01-27 (×7): qty 1

## 2012-01-27 MED ORDER — INSULIN ASPART 100 UNIT/ML ~~LOC~~ SOLN
0.0000 [IU] | Freq: Three times a day (TID) | SUBCUTANEOUS | Status: DC
  Administered 2012-01-27 – 2012-01-29 (×6): 2 [IU] via SUBCUTANEOUS

## 2012-01-27 MED ORDER — SODIUM CHLORIDE 0.9 % IJ SOLN
3.0000 mL | Freq: Two times a day (BID) | INTRAMUSCULAR | Status: DC
  Administered 2012-01-27 – 2012-01-30 (×7): 3 mL via INTRAVENOUS

## 2012-01-27 MED ORDER — METOPROLOL TARTRATE 12.5 MG HALF TABLET
12.5000 mg | ORAL_TABLET | Freq: Two times a day (BID) | ORAL | Status: DC
  Administered 2012-01-27 – 2012-01-29 (×3): 12.5 mg via ORAL
  Filled 2012-01-27 (×7): qty 1

## 2012-01-27 MED ORDER — INSULIN GLARGINE 100 UNIT/ML ~~LOC~~ SOLN
12.0000 [IU] | Freq: Every day | SUBCUTANEOUS | Status: DC
  Administered 2012-01-27 – 2012-01-30 (×4): 12 [IU] via SUBCUTANEOUS

## 2012-01-27 MED ORDER — SODIUM CHLORIDE 0.9 % IJ SOLN: 3.0000 mL | INTRAMUSCULAR | Status: DC | PRN

## 2012-01-27 MED ORDER — FOLIC ACID 1 MG PO TABS
1.0000 mg | ORAL_TABLET | Freq: Every day | ORAL | Status: DC
  Administered 2012-01-30 – 2012-02-02 (×4): 1 mg via ORAL
  Filled 2012-01-27 (×4): qty 1

## 2012-01-27 MED FILL — Potassium Chloride Inj 2 mEq/ML: INTRAVENOUS | Qty: 40 | Status: AC

## 2012-01-27 MED FILL — Magnesium Sulfate Inj 50%: INTRAMUSCULAR | Qty: 10 | Status: AC

## 2012-01-27 MED FILL — Dexmedetomidine HCl IV Soln 200 MCG/2ML: INTRAVENOUS | Qty: 2 | Status: AC

## 2012-01-27 NOTE — Progress Notes (Signed)
   CARDIOTHORACIC SURGERY PROGRESS NOTE   R1 Day Post-Op Procedure(s) (LRB): AORTIC VALVE REPLACEMENT (AVR) (N/A) REPAIR OF PATENT FORAMEN OVALE (N/A)  Subjective: Looks good.  Mild soreness in chest.  Nauseated.  Objective: Vital signs: BP Readings from Last 1 Encounters:  01/27/12 101/64   Pulse Readings from Last 1 Encounters:  01/27/12 80   Resp Readings from Last 1 Encounters:  01/27/12 19   Temp Readings from Last 1 Encounters:  01/27/12 98.1 F (36.7 C) Core (Comment)    Hemodynamics: PAP: (14-34)/(8-20) 30/14 mmHg CO:  [3.1 L/min-5.4 L/min] 5.4 L/min CI:  [1.7 L/min/m2-3 L/min/m2] 3 L/min/m2  Physical Exam:  Rhythm:   sinus  Breath sounds: clear  Heart sounds:  RRR  Incisions:  Dressings dry  Abdomen:  Soft, non distended, non tender  Extremities:  Warm, well perfused   Intake/Output from previous day: 03/27 0701 - 03/28 0700 In: 4020.4 [I.V.:2666.4; Blood:400; IV Piggyback:954] Out: 2660 [Urine:2230; Chest Tube:430] Intake/Output this shift:    Lab Results:  Basename 01/27/12 0400 01/26/12 2031 01/26/12 2017  WBC 16.2* -- 18.8*  HGB 11.2* 12.2 --  HCT 32.9* 36.0 --  PLT 125* -- 140*   BMET:  Basename 01/27/12 0400 01/26/12 2031 01/24/12 1041  NA 138 142 --  K 4.1 4.1 --  CL 109 108 --  CO2 20 -- 21  GLUCOSE 109* 151* --  BUN 13 11 --  CREATININE 0.58 0.70 --  CALCIUM 8.6 -- 9.5    CBG (last 3)   Basename 01/26/12 2108 01/26/12 2007 01/26/12 1903  GLUCAP 145* 147* 122*   ABG    Component Value Date/Time   PHART 7.267* 01/27/2012 0026   HCO3 22.0 01/27/2012 0026   TCO2 23 01/27/2012 0026   ACIDBASEDEF 5.0* 01/27/2012 0026   O2SAT 93.0 01/27/2012 0026   CXR: Mild congestion.  Looks good.  Assessment/Plan: S/P Procedure(s) (LRB): AORTIC VALVE REPLACEMENT (AVR) (N/A) REPAIR OF PATENT FORAMEN OVALE (N/A)  Doing well POD1 Stable hemodynamics off all drips Mild nausea Expected post op acute blood loss anemia, mild PATIENT DOES  NOT WANT BLOOD PRODUCTS GIVEN UNLESS LIFE-THREATENING ANEMIA Expected post op volume excess, mild Likely type II diabetes with Hgb a1c 6.2 preop and still on insulin drip   Mobilize  D/C tubes and lines  Add low dose lantus insulin and continue SSI  Transfer step down  Minimize blood draws  Charliene Inoue H 01/27/2012 8:05 AM

## 2012-01-27 NOTE — Anesthesia Postprocedure Evaluation (Signed)
  Anesthesia Post-op Note  Patient: Courtney Harvey  Procedure(s) Performed: Procedure(s) (LRB): AORTIC VALVE REPLACEMENT (AVR) (N/A) REPAIR OF PATENT FORAMEN OVALE (N/A)  Patient Location: ICU  Anesthesia Type: General  Level of Consciousness: sedated  Airway and Oxygen Therapy: Patient remains intubated per anesthesia plan  Post-op Pain: none  Post-op Assessment: Post-op Vital signs reviewed, Patient's Cardiovascular Status Stable and Respiratory Function Stable  Post-op Vital Signs: Reviewed and stable  Complications: No apparent anesthesia complications

## 2012-01-27 NOTE — Progress Notes (Signed)
Nursing Note  Attempting to wean patient again, patient more awake and alert throughout weaning process.  ABG on CPAP/PS obtained: Arterial Blood Gas result:  pO2 99; pCO2 47.8; pH 7.28;  HCO3 22.8, %O2 Sat 97.  Results called to Dr. Dorris Fetch, order to proceed with extubation and monitor very closely post extubation. Will also get a 1 hour post extubation ABG.  RT and patient  updated on plan of care.  L.Antion Andres, RN

## 2012-01-27 NOTE — Progress Notes (Signed)
Nursing Note  Follow up extubation ABG obtained: Arterial Blood Gas result:  pO2 74; pCO2 47.5; pH 7.27;  HCO3 22, %O2 Sat 93.  Pt alert and oriented, not lethargic, able to pull 500-750 on incentive spirometer.  Patient also has a strong productive cough.  Will encourage TCDB/IS and dangle patient.  Will continue to monitor closely.    Dorette Grate RN

## 2012-01-28 ENCOUNTER — Inpatient Hospital Stay (HOSPITAL_COMMUNITY): Payer: PRIVATE HEALTH INSURANCE

## 2012-01-28 LAB — CBC
HCT: 34.8 % — ABNORMAL LOW (ref 36.0–46.0)
Hemoglobin: 11.4 g/dL — ABNORMAL LOW (ref 12.0–15.0)
MCH: 30.5 pg (ref 26.0–34.0)
MCHC: 32.8 g/dL (ref 30.0–36.0)
MCV: 93 fL (ref 78.0–100.0)
RBC: 3.74 MIL/uL — ABNORMAL LOW (ref 3.87–5.11)

## 2012-01-28 LAB — GLUCOSE, CAPILLARY
Glucose-Capillary: 153 mg/dL — ABNORMAL HIGH (ref 70–99)
Glucose-Capillary: 143 mg/dL — ABNORMAL HIGH (ref 70–99)

## 2012-01-28 LAB — BASIC METABOLIC PANEL
BUN: 18 mg/dL (ref 6–23)
Chloride: 101 mEq/L (ref 96–112)
Creatinine, Ser: 0.65 mg/dL (ref 0.50–1.10)
GFR calc non Af Amer: >90 mL/min (ref >90)
Glucose, Bld: 145 mg/dL — ABNORMAL HIGH (ref 70–99)
Potassium: 4.3 mEq/L (ref 3.5–5.1)

## 2012-01-28 NOTE — Progress Notes (Signed)
   CARE MANAGEMENT NOTE 01/28/2012  Patient:  Courtney Harvey, Courtney Harvey   Account Number:  [id number]  Date Initiated:  01/26/2012  Documentation initiated by:  [facility]  Subjective/Objective Assessment:   AVR - repair of PFO  Lives alone- has children     Action/Plan:   PTA, PT INDEPENDENT AND LIVES ALONE.  DAUGHTER TO PROVIDE 24HR CARE AT DISCHARGE.   Anticipated DC Date:  01/31/2012   Anticipated DC Plan:  HOME W HOME HEALTH SERVICES      DC Planning Services  CM consult      Choice offered to / List presented to:             Status of service:  In process, will continue to follow Medicare Important Message given?   (If response is "NO", the following Medicare IM given date fields will be blank) Date Medicare IM given:   Date Additional Medicare IM given:    Discharge Disposition:    Per UR Regulation:  Reviewed for med. necessity/level of care/duration of stay  If discussed at Long Length of Stay Meetings, dates discussed:    Comments:  01/28/12 Larissa Pegg, RN,BSN [id number] WILL CONT TO FOLLOW PT FOR HOME NEEDS AS SHE PROGRESSES. Phone #[phone]

## 2012-01-28 NOTE — Progress Notes (Signed)
2 Days Post-Op Procedure(s) (LRB): AORTIC VALVE REPLACEMENT (AVR) (N/A) REPAIR OF PATENT FORAMEN OVALE (N/A) Subjective: No complaints  Objective: Vital signs in last 24 hours: Temp:  [97.9 F (36.6 C)-98.9 F (37.2 C)] 98.2 F (36.8 C) (03/29 1227) Pulse Rate:  [67-93] 79  (03/29 1300) Cardiac Rhythm:  [-] Normal sinus rhythm (03/29 0500) Resp:  [16-27] 24  (03/29 1300) BP: (92-121)/(51-87) 104/68 mmHg (03/29 1300) SpO2:  [90 %-98 %] 95 % (03/29 1300) Weight:  [73.6 kg (162 lb 4.1 oz)] 73.6 kg (162 lb 4.1 oz) (03/29 0400)  Hemodynamic parameters for last 24 hours:    Intake/Output from previous day: 03/28 0701 - 03/29 0700 In: 1324.1 [P.O.:1110; I.V.:208.1; IV Piggyback:6] Out: 805 [Urine:640; Emesis/NG output:100; Chest Tube:65] Intake/Output this shift: Total I/O In: 150 [P.O.:150] Out: 200 [Urine:200]  General appearance: alert and cooperative Heart: regular rate and rhythm, S1, S2 normal, no murmur, click, rub or gallop Lungs: clear to auscultation bilaterally Extremities: edema mild Wound: incision ok  Lab Results:  Basename 01/28/12 0405 01/27/12 0400  WBC 19.1* 16.2*  HGB 11.4* 11.2*  HCT 34.8* 32.9*  PLT 124* 125*   BMET:  Basename 01/28/12 0405 01/27/12 0400  NA 136 138  K 4.3 4.1  CL 101 109  CO2 27 20  GLUCOSE 145* 109*  BUN 18 13  CREATININE 0.65 0.58  CALCIUM 9.4 8.6    PT/INR:  Basename 01/26/12 1400  LABPROT 18.8*  INR 1.54*   ABG    Component Value Date/Time   PHART 7.267* 01/27/2012 0026   HCO3 22.0 01/27/2012 0026   TCO2 23 01/27/2012 0026   ACIDBASEDEF 5.0* 01/27/2012 0026   O2SAT 93.0 01/27/2012 0026   CBG (last 3)   Basename 01/28/12 1224 01/28/12 0743 01/28/12 0359  GLUCAP 143* 153* 143*    Assessment/Plan: S/P Procedure(s) (LRB): AORTIC VALVE REPLACEMENT (AVR) (N/A) REPAIR OF PATENT FORAMEN OVALE (N/A) Mobilize Diuresis Diabetes control Awaiting bed on 2000   LOS: 2 days    Breahna Boylen K 01/28/2012

## 2012-01-28 NOTE — Plan of Care (Signed)
Problem: Phase III Progression Outcomes Goal: Time patient transferred to PCTU/Telemetry POD Outcome: Completed/Met Date Met:  01/28/12 Transferred to 2015, at 1635pm

## 2012-01-29 MED ORDER — METOPROLOL TARTRATE 25 MG PO TABS
25.0000 mg | ORAL_TABLET | Freq: Two times a day (BID) | ORAL | Status: DC
  Administered 2012-01-29 – 2012-01-31 (×5): 25 mg via ORAL
  Filled 2012-01-29 (×7): qty 1

## 2012-01-29 NOTE — Progress Notes (Addendum)
CARDIAC REHAB PHASE I   PRE:  Rate/Rhythm: 76 Sr  BP:  Supine:   Sitting: 96/72  Standing:    SaO2: 97 2L  MODE:  Ambulation: 150 ft   POST:  Rate/Rhythem  88  BP:  Supine:   Sitting: 112/789  Standing:    SaO2: 96 2L  Courtney Harvey [name]   11:20 to 11:50   Patient ambulated with assist x 2 using portable O2.  Pace slow, gait fairly steady.  Stopped to rest x 1.  O2 sats stable with walk at 97 %.  Encouraged PLB.  Returned to room to bedside chair.  Call bell in place.   Cathie Olden RN

## 2012-01-29 NOTE — Progress Notes (Addendum)
[address]            [place] [address]          [phone]     3 Days Post-Op  Procedure(s) (LRB): AORTIC VALVE REPLACEMENT (AVR) (N/A) REPAIR OF PATENT FORAMEN OVALE (N/A) Subjective: Feels fairly well. Mild shortness of breath.  Objective  Telemetry NSR, occas PVC's  Temp:  [97.9 F (36.6 C)] 97.9 F (36.6 C) (03/30 0356) Pulse Rate:  [80-87] 83  (03/30 0927) Resp:  [20-22] 20  (03/30 0356) BP: (103-122)/(73-78) 122/78 mmHg (03/30 0927) SpO2:  [96 %] 96 % (03/30 0356) Weight:  [163 lb 14.4 oz (74.345 kg)] 163 lb 14.4 oz (74.345 kg) (03/30 0356)   Intake/Output Summary (Last 24 hours) at 01/29/12 1328 Last data filed at 01/29/12 1000  Gross per 24 hour  Intake    840 ml  Output    750 ml  Net     90 ml       General appearance: alert, cooperative and no distress Heart: irregularly irregular rhythm and S1, S2 normal Lungs: mildly diminished in bases Abdomen: soft, nontender Extremities: mild edema Wound: incis healing well  Lab Results:  Basename 01/28/12 0405 01/27/12 0400 01/26/12 2017  NA 136 138 --  K 4.3 4.1 --  CL 101 109 --  CO2 27 20 --  GLUCOSE 145* 109* --  BUN 18 13 --  CREATININE 0.65 0.58 --  CALCIUM 9.4 8.6 --  MG -- 2.3 2.7*  PHOS -- -- --   No results found for this basename: AST:2,ALT:2,ALKPHOS:2,BILITOT:2,PROT:2,ALBUMIN:2 in the last 72 hours No results found for this basename: LIPASE:2,AMYLASE:2 in the last 72 hours  Basename 01/28/12 0405 01/27/12 0400  WBC 19.1* 16.2*  NEUTROABS -- --  HGB 11.4* 11.2*  HCT 34.8* 32.9*  MCV 93.0 91.6  PLT 124* 125*   No results found for this basename: CKTOTAL:4,CKMB:4,TROPONINI:4 in the last 72 hours No components found with this basename: POCBNP:3 No results found for this basename: DDIMER in the last 72 hours No results found for this basename: HGBA1C in the last 72 hours No results found for this basename: CHOL,HDL,LDLCALC,TRIG,CHOLHDL in the last  72 hours No results found for this basename: TSH,T4TOTAL,FREET3,T3FREE,THYROIDAB in the last 72 hours No results found for this basename: VITAMINB12,FOLATE,FERRITIN,TIBC,IRON,RETICCTPCT in the last 72 hours  Medications: Scheduled    . acetaminophen  1,000 mg Oral Q6H   Or  . acetaminophen (TYLENOL) oral liquid 160 mg/5 mL  975 mg Per Tube Q6H  . aspirin EC  325 mg Oral Daily  . bisacodyl  10 mg Oral Daily   Or  . bisacodyl  10 mg Rectal Daily  . chlorhexidine  15 mL Mouth/Throat BID  . docusate sodium  200 mg Oral Daily  . ferrous sulfate  325 mg Oral BID  . folic acid  1 mg Oral Daily  . furosemide  40 mg Oral Daily  . insulin aspart  0-24 Units Subcutaneous TID AC & HS  . insulin glargine  12 Units Subcutaneous Daily  . metoprolol tartrate  12.5 mg Oral BID  . midazolam  2 mg Intravenous Once  . pantoprazole  40 mg Oral Q1200  . potassium chloride  20 mEq Oral Daily  . sodium chloride  3 mL Intravenous Q12H     Radiology/Studies:  Dg Chest 2 View  01/28/2012  *RADIOLOGY REPORT*  Clinical  Data: Status post aortic valve replacement.  CHEST - 2 VIEW  Comparison: 1 day prior  Findings: Prior median sternotomy.  Prior mitral valve repair.  Midline trachea.  Mild cardiomegaly.  Small left greater than right pleural effusions.  The right pleural effusion is either new or increased.  Removal of bilateral chest tubes.  Numerous leads and wires project over the chest.  No apical pneumothorax.  There is lucency about the lateral left hemithorax.  Resolved interstitial edema.  Persistent left greater than right bibasilar airspace disease.  IMPRESSION:  1.  Removal of bilateral chest tubes.  Lucency along the left lateral hemithorax is suspicious for small amount of pleural air. 2.  Resolved interstitial edema. 3.  Persistent bibasilar atelectasis with small bilateral pleural effusions.  Original Report Authenticated By: Consuello Bossier, M.D.    INR: Will add last result for INR, ABG once  components are confirmed Will add last 4 CBG results once components are confirmed  Assessment/Plan: S/P Procedure(s) (LRB): AORTIC VALVE REPLACEMENT (AVR) (N/A) REPAIR OF PATENT FORAMEN OVALE (N/A) Steady progress Increase lopressor Push rehab, pulm toilet Gentle diuresis Recheck labs in am  LOS: 3 days    GOLD,WAYNE E 3/30/20131:28 PM    Agree with above

## 2012-01-30 LAB — BASIC METABOLIC PANEL
BUN: 16 mg/dL (ref 6–23)
CO2: 32 mEq/L (ref 19–32)
Calcium: 9 mg/dL (ref 8.4–10.5)
Chloride: 101 mEq/L (ref 96–112)
Creatinine, Ser: 0.65 mg/dL (ref 0.50–1.10)
Glucose, Bld: 85 mg/dL (ref 70–99)

## 2012-01-30 LAB — CBC
HCT: 28.8 % — ABNORMAL LOW (ref 36.0–46.0)
MCH: 30.7 pg (ref 26.0–34.0)
MCV: 92 fL (ref 78.0–100.0)
RBC: 3.13 MIL/uL — ABNORMAL LOW (ref 3.87–5.11)
WBC: 7.6 10*3/uL (ref 4.0–10.5)

## 2012-01-30 LAB — GLUCOSE, CAPILLARY
Glucose-Capillary: 86 mg/dL (ref 70–99)
Glucose-Capillary: 123 mg/dL — ABNORMAL HIGH (ref 70–99)

## 2012-01-30 MED ORDER — OXYCODONE HCL 5 MG PO TABS: 5.0000 mg | ORAL_TABLET | ORAL | Status: AC | PRN

## 2012-01-30 MED ORDER — ASPIRIN 325 MG PO TBEC: 325.0000 mg | DELAYED_RELEASE_TABLET | Freq: Every day | ORAL | Status: DC

## 2012-01-30 MED ORDER — METOPROLOL TARTRATE 25 MG PO TABS: 25.0000 mg | ORAL_TABLET | Freq: Two times a day (BID) | ORAL | Status: DC

## 2012-01-30 NOTE — Discharge Instructions (Signed)
Aortic Valve Replacement Care After Read the instructions outlined below and refer to this sheet for the next few weeks. These discharge instructions provide you with general information on caring for yourself after you leave the hospital. Your surgeon may also give you specific instructions. While your treatment has been planned according to the most current medical practices available, unavoidable complications occasionally occur. If you have any problems or questions after discharge, please call your surgeon. AFTER THE PROCEDURE  Full recovery from heart valve surgery can take several months.   Blood thinning (anticoagulation) treatment with warfarin is often prescribed for 6 weeks to 3 months after surgery for those with biological valves. It is prescribed for life for those with mechanical valves.   Recovery includes healing of the surgical incision. There is a gradual building of stamina and exercise abilities. An exercise program under the direction of a physical therapist may be recommended.   Once you have an artificial valve, your heart function and your life will return to normal. You usually feel better after surgery. Shortness of breath and fatigue should lessen. If your heart was already severely damaged before your surgery, you may continue to have problems.   You can usually resume most of your normal activities. You will have to continue to monitor your condition. You need to watch out for blood clots and infections.   Artificial valves need to be replaced after a period of time. It is important that you see your caregiver regularly.   Some individuals with an aortic valve replacement need to take antibiotics before having dental work or other surgical procedures. This is called prophylactic antibiotic treatment. These drugs help to prevent infective endocarditis. Antibiotics are only recommended for individuals with the highest risk for developing infective endocarditis. Let your  dentist and your caregiver know if you have a history of any of the following so that the necessary precautions can be taken:   A VSD.   A repaired VSD.   Endocarditis in the past.   An artificial (prosthetic) heart valve.  HOME CARE INSTRUCTIONS   Use all medications as prescribed.   Take your temperature every morning for the first week after surgery. Record these.   Weigh yourself every morning for at least the first week after surgery and record.   Do not lift more than 10 pounds (4.5 kg) until your breastbone (sternum) has healed. Avoid all activities which would place strain on your incision.   You may shower as soon as directed by your caregiver after surgery. Pat incisions dry. Do not rub incisions with washcloth or towel.   Avoid driving for 4 to 6 weeks following surgery or as instructed.   Use your elastic stockings during the day. You should wear the stockings for at least 2 weeks after discharge or longer if your ankles are swollen. The stockings help blood flow and help reduce swelling in the legs. It is easiest to put the stockings on before you get out of bed in the morning. They should fit snugly.  Pain Control  If a prescription was given for a pain reliever, please follow your doctor's directions.   If the pain is not relieved by your medicine, becomes worse, or you have difficulty breathing, call your surgeon.  Activity  Take frequent rest periods throughout the day.   Wait one week before returning to strenuous activities such as heavy lifting (more than 10 pounds), pushing or pulling.   Talk with your doctor about when you may   return to work and your exercise routine.   Do not drive while taking prescription pain medication.  Nutrition  You may resume your normal diet.   Drink plenty of fluids (6-8 glasses a day).   Eat a well-balanced diet.   Call your caregiver for persistent nausea or vomiting.  Elimination Your normal bowel function should  return. If constipation should occur, you may:  Take a mild laxative.   Add fruit and bran to your diet.   Drink more fluids.   Call your doctor if constipation is not relieved.  SEEK IMMEDIATE MEDICAL CARE IF:   You develop chest pain which is not coming from your surgical cut (incision).   You develop shortness of breath or have difficulty breathing.   You develop a temperature over 101 F (38.3 C).   You have a sudden weight gain. Let your caregiver know what the weight gain is.   You develop a rash.   You develop any reaction or side effects to medications given.   You have increased bleeding from wounds.   You see redness, swelling, or have increasing pain in wounds.   You have pus coming from your wound.   You develop lightheadedness or feel faint.  Document Released: 05/06/2005 Document Revised: 10/07/2011 Document Reviewed: 07/28/2005 Exit[employer] Patient Information 2012 [employer]. 

## 2012-01-30 NOTE — Progress Notes (Addendum)
[address]            [place] [address]          [phone]     4 Days Post-Op  Procedure(s) (LRB): AORTIC VALVE REPLACEMENT (AVR) (N/A) REPAIR OF PATENT FORAMEN OVALE (N/A) Subjective: Feeling stronger, no new complaints  Objective  Telemetry SR/PVC's  Temp:  [98.2 F (36.8 C)-98.7 F (37.1 C)] 98.2 F (36.8 C) (03/31 0447) Pulse Rate:  [73-85] 85  (03/31 0926) Resp:  [16-18] 17  (03/31 0447) BP: (104-117)/(70-82) 106/80 mmHg (03/31 0926) SpO2:  [93 %-95 %] 95 % (03/31 0447) Weight:  [160 lb 4.4 oz (72.7 kg)] 160 lb 4.4 oz (72.7 kg) (03/31 0245)   Intake/Output Summary (Last 24 hours) at 01/30/12 1133 Last data filed at 01/29/12 1703  Gross per 24 hour  Intake    480 ml  Output    300 ml  Net    180 ml      Physical Examination: General appearance - alert, well appearing, and in no distress Chest - clear to auscultation, no wheezes, rales or rhonchi, symmetric air entry Heart - normal rate and regular rhythm, S1 and S2 normal, soft systolic murmur Abdomen - soft, nontender, nondistended, no masses or organomegaly Extremities - minor edema Skin - incisions healing well without signs of infection Lab Results:  Basename 01/30/12 0530 01/28/12 0405  NA 139 136  K 3.6 4.3  CL 101 101  CO2 32 27  GLUCOSE 85 145*  BUN 16 18  CREATININE 0.65 0.65  CALCIUM 9.0 9.4  MG -- --  PHOS -- --   No results found for this basename: AST:2,ALT:2,ALKPHOS:2,BILITOT:2,PROT:2,ALBUMIN:2 in the last 72 hours No results found for this basename: LIPASE:2,AMYLASE:2 in the last 72 hours  Basename 01/30/12 0530 01/28/12 0405  WBC 7.6 19.1*  NEUTROABS -- --  HGB 9.6* 11.4*  HCT 28.8* 34.8*  MCV 92.0 93.0  PLT 122* 124*   No results found for this basename: CKTOTAL:4,CKMB:4,TROPONINI:4 in the last 72 hours No components found with this basename: POCBNP:3 No results found for this basename: DDIMER in the last 72 hours No results found for  this basename: HGBA1C in the last 72 hours No results found for this basename: CHOL,HDL,LDLCALC,TRIG,CHOLHDL in the last 72 hours No results found for this basename: TSH,T4TOTAL,FREET3,T3FREE,THYROIDAB in the last 72 hours No results found for this basename: VITAMINB12,FOLATE,FERRITIN,TIBC,IRON,RETICCTPCT in the last 72 hours  Medications: Scheduled    . acetaminophen  1,000 mg Oral Q6H   Or  . acetaminophen (TYLENOL) oral liquid 160 mg/5 mL  975 mg Per Tube Q6H  . aspirin EC  325 mg Oral Daily  . bisacodyl  10 mg Oral Daily   Or  . bisacodyl  10 mg Rectal Daily  . chlorhexidine  15 mL Mouth/Throat BID  . docusate sodium  200 mg Oral Daily  . ferrous sulfate  325 mg Oral BID  . folic acid  1 mg Oral Daily  . furosemide  40 mg Oral Daily  . insulin aspart  0-24 Units Subcutaneous TID AC & HS  . insulin glargine  12 Units Subcutaneous Daily  . metoprolol tartrate  25 mg Oral BID  . midazolam  2 mg Intravenous Once  . pantoprazole  40 mg Oral Q1200  . potassium chloride  20 mEq Oral Daily  . sodium chloride  3 mL Intravenous Q12H  .  DISCONTD: metoprolol tartrate  12.5 mg Oral BID     Radiology/Studies:  No results found.  INR: Will add last result for INR, ABG once components are confirmed Will add last 4 CBG results once components are confirmed  Assessment/Plan: S/P Procedure(s) (LRB): AORTIC VALVE REPLACEMENT (AVR) (N/A) REPAIR OF PATENT FORAMEN OVALE (N/A)  1. Cont to make good progress 2. WBC normalized, decreased H/H-monitor 3. D/c lantus 4. D/c epw's 5. Poss home 1-2 days  LOS: 4 days    GOLD,WAYNE E 3/31/201311:33 AM   Stop drawing labs.  Her Hgb is fine. She is a Scientist, product/process development.

## 2012-01-31 ENCOUNTER — Other Ambulatory Visit: Payer: Self-pay | Admitting: Thoracic Surgery (Cardiothoracic Vascular Surgery)

## 2012-01-31 DIAGNOSIS — I350 Nonrheumatic aortic (valve) stenosis: Secondary | ICD-10-CM

## 2012-01-31 LAB — GLUCOSE, CAPILLARY: Glucose-Capillary: 113 mg/dL — ABNORMAL HIGH (ref 70–99)

## 2012-01-31 MED ORDER — POLYETHYLENE GLYCOL 3350 17 G PO PACK
17.0000 g | PACK | Freq: Every day | ORAL | Status: DC
  Administered 2012-01-31 – 2012-02-01 (×2): 17 g via ORAL
  Filled 2012-01-31 (×3): qty 1

## 2012-01-31 MED ORDER — SIMETHICONE 80 MG PO CHEW
80.0000 mg | CHEWABLE_TABLET | Freq: Four times a day (QID) | ORAL | Status: DC | PRN
  Administered 2012-01-31 (×2): 80 mg via ORAL
  Filled 2012-01-31 (×2): qty 1

## 2012-01-31 MED FILL — Mannitol IV Soln 20%: INTRAVENOUS | Qty: 500 | Status: AC

## 2012-01-31 MED FILL — Heparin Sodium (Porcine) Inj 1000 Unit/ML: INTRAMUSCULAR | Qty: 10 | Status: AC

## 2012-01-31 MED FILL — Lidocaine HCl IV Inj 20 MG/ML: INTRAVENOUS | Qty: 5 | Status: AC

## 2012-01-31 MED FILL — Sodium Chloride Irrigation Soln 0.9%: Qty: 3000 | Status: AC

## 2012-01-31 MED FILL — Sodium Bicarbonate IV Soln 8.4%: INTRAVENOUS | Qty: 50 | Status: AC

## 2012-01-31 MED FILL — Sodium Chloride IV Soln 0.9%: INTRAVENOUS | Qty: 1000 | Status: AC

## 2012-01-31 MED FILL — Heparin Sodium (Porcine) Inj 1000 Unit/ML: INTRAMUSCULAR | Qty: 30 | Status: AC

## 2012-01-31 MED FILL — Electrolyte-R (PH 7.4) Solution: INTRAVENOUS | Qty: 6000 | Status: AC

## 2012-01-31 NOTE — Progress Notes (Addendum)
Subjective:   Courtney Harvey complains of a lot of gas pains along her abdomen this morning.   She states she has had a small bowel movement, but not a good one.   Objective:  Vital Signs in the last 24 hours: Temp:  [97.5 F (36.4 C)-98.4 F (36.9 C)] 97.9 F (36.6 C) (04/01 0500) Pulse Rate:  [76-85] 76  (04/01 0500) Resp:  [16-18] 18  (04/01 0500) BP: (106-122)/(77-83) 118/83 mmHg (04/01 0500) SpO2:  [91 %-97 %] 97 % (04/01 0500) Weight:  [159 lb 2.8 oz (72.2 kg)] 159 lb 2.8 oz (72.2 kg) (04/01 0500)  Intake/Output from previous day: 03/31 0701 - 04/01 0700 In: 480 [P.O.:480] Out: -  Intake/Output from this shift:    Physical Exam: General appearance: alert and no distress Lungs: clear to auscultation bilaterally Heart: regular rate and rhythm, S1, S2 normal and systolic murmur: soft murmur present  Abdomen: soft, non-tender; bowel sounds normal; no masses,  no organomegaly Extremities: edema trace Skin: incision C/D/I   Lab Results:  Basename 01/30/12 0530  WBC 7.6  HGB 9.6*  PLT 122*    Basename 01/30/12 0530  NA 139  K 3.6  CL 101  CO2 32  GLUCOSE 85  BUN 16  CREATININE 0.65   No results found for this basename: TROPONINI:2,CK,MB:2 in the last 72 hours Hepatic Function Panel No results found for this basename: PROT,ALBUMIN,AST,ALT,ALKPHOS,BILITOT,BILIDIR,IBILI in the last 72 hours No results found for this basename: CHOL in the last 72 hours No results found for this basename: PROTIME in the last 72 hours  Assessment/Plan:    1. S/P AVR/PFO closure 2. CV- RRR, HR, blood pressure well controlled 3.Abdominal discomfort- good bowel sounds present, patient feels related to gas, will give simethicone prn 4. Resp- not on oxygen, lungs sound clear 5. Dispo- patient likely d/c home tomorrow   LOS: 5 days    Courtney Harvey 01/31/2012, 7:48 AM   Patient seen and examined. Agree with above

## 2012-01-31 NOTE — Progress Notes (Signed)
Pt ambulating independently

## 2012-01-31 NOTE — Progress Notes (Signed)
CARDIAC REHAB PHASE I   PRE:  Rate/Rhythm: 85SR  BP:  Supine: 105/66  Sitting:   Standing:    SaO2: 91%RA  MODE:  Ambulation: 550 ft   POST:  Rate/Rhythem: 96SR  BP:  Supine:   Sitting: 122/81  Standing:     SaO2: 89-90%RA 4098-1191 Pt walked 550 ft on RA with rolling walker and asst x 2. Can be asst x 1. Tolerated well. Pt doesn't think she will need walker at home. Encouraged use of IS. To recliner with call bell.  Courtney Harvey

## 2012-01-31 NOTE — Discharge Summary (Signed)
Physician Discharge Summary  Patient ID: Courtney Harvey MRN: [id number] DOB/AGE: 1956/07/24 56 y.o.  Admit date: 01/26/2012 Discharge date: 01/31/2012  Admission Diagnoses:  Patient Active Problem List  Diagnoses  . Aortic insufficiency and aortic stenosis  . Hypertension  . Tobacco abuse  . AS (aortic stenosis)   Discharge Diagnoses:   Patient Active Problem List  Diagnoses  . Aortic insufficiency and aortic stenosis  . Hypertension  . Tobacco abuse  . AS (aortic stenosis)  . S/P aortic valve replacement  . S/P patent foramen ovale closure   Discharged Condition: good  History of Present Illness:   The patient is a 56 yo white female with known history of Aortic Stenosis originally diagnosed in 2005 during an ECHO and cardiac catheterization for atypical chest pain.  At that time she was found to have moderate aortic stenosis and moderate aortic regurgitation without significant coronary disease.  The patient did not routinely follow with a Cardiologist.  However she presented to an OSH ED with a several month history of worsening atypical chest pain.  She described the pain as being constant, consisting of tightness across her left chest.  She also complained of some intermittent episodes of sharp pain across her chest not related to physical activity.  This pain was associated with dizziness without syncope and exertional shortness of breath with occasional episodes at rest.  She denied PND, orthopnea, or LE edema.  She was admitted to the hospital and underwent TTE which demonstrated severe aortic stenosis with moderate aortic insufficiency and a preserved LV Function.  The patient was subsequently transferred to [facility] under the care of Cardiology service.  She was taken for cardiac catheterization which again did not reveal significant coronary artery disease.  Cardiothoracic surgery was consulted for possible intervention. Dr. 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  Once TEE results obtained revealed patient had a trileaflet AV and a large PFO.  It was felt the patient would benefit from surgery.  The patient presented to our office on 01/17/2012 at which time the procedure was again discussed in length with the patient.  However, at that visit the patient being a Jehovah's witness was unsure if she would be willing to accept blood products should the need arise.  It was felt we should not do the procedure minimally invasive due to the increased risks.  She did agree to proceed with a tissue valve and the scheduled was scheduled for 01/26/2012 to give the patient some additional time to decided on her wishes for transfusion of blood products.    Hospital Course:   On 01/26/2012 the patient presented to [facility] and underwent Aortic Valve Replacement utilizing a 21mm Edwards Magna Ease Pericardial Tissue Valve and Closure of Patent Foramen Ovale.  The patient tolerated the procedure well with no administration of blood products.  POD #0 the patient was extubated.  POD #1 the patients chest tubes and central lines were removed without difficulty.  The patient was medically stable and transferred to stepdown in stable condition.  POD #2 the patients follow up CXR revealed small bilateral pleural effusions and atelectasis. No evidence of pneumothorax was appreciated.  POD #4 the patient's external pacing wires were removed without difficulty.  She was taken off insulin, being patient had good sugar control and has no history of diabetes.  POD #5 the patient is  doing very well.  She does complain of some abdominal discomfort which she feels is related to gas pains.  Her bowels have moved since surgery.  She is medically stable at this time.  Should no further problems arise we will plan for discharge home in the next 24-48hrs.   Disposition: 01-Home or  Self Care   Medication List  As of 01/31/2012  8:12 AM   STOP taking these medications         chlorhexidine 0.12 % solution      nitroGLYCERIN 0.4 MG SL tablet         TAKE these medications         aspirin 325 MG EC tablet   Take 1 tablet (325 mg total) by mouth daily.      docusate sodium 100 MG capsule   Commonly known as: COLACE   Take 100 mg by mouth daily.      ferrous sulfate 325 (65 FE) MG tablet   Take 325 mg by mouth 2 (two) times daily.      folic acid 800 MCG tablet   Commonly known as: FOLVITE   Take 800 mcg by mouth daily.      loratadine 10 MG tablet   Commonly known as: CLARITIN   Take 10 mg by mouth daily as needed. For allergy      metoprolol tartrate 25 MG tablet   Commonly known as: LOPRESSOR   Take 1 tablet (25 mg total) by mouth 2 (two) times daily.      nicotine 21 mg/24hr patch   Commonly known as: NICODERM CQ - dosed in mg/24 hours   Place 1 patch onto the skin daily.      oxyCODONE 5 MG immediate release tablet   Commonly known as: Oxy IR/ROXICODONE   Take 1-2 tablets (5-10 mg total) by mouth every 3 (three) hours as needed.           Follow-up Information    Follow up with Purcell Nails, MD. (3 weeks-office will contact you. Additionally will obtain a chest x-ray [facility] prior to seeing Dr.Owen. [facility] is in the same office complex.)    Contact information:   [address] [place] [address] [phone]       Follow up with Charlton Haws, MD. (2 weeks-please contact the office to arrange appointment.)    Contact information:   [address] [place] [address] [phone]          Signed: Lowella Dandy 01/31/2012, 8:12 AM

## 2012-02-01 LAB — GLUCOSE, CAPILLARY
Glucose-Capillary: 102 mg/dL — ABNORMAL HIGH (ref 70–99)
Glucose-Capillary: 121 mg/dL — ABNORMAL HIGH (ref 70–99)

## 2012-02-01 MED ORDER — METOPROLOL TARTRATE 50 MG PO TABS
50.0000 mg | ORAL_TABLET | Freq: Two times a day (BID) | ORAL | Status: DC
  Administered 2012-02-01 – 2012-02-02 (×3): 50 mg via ORAL
  Filled 2012-02-01 (×4): qty 1

## 2012-02-01 NOTE — Progress Notes (Signed)
CARDIAC REHAB PHASE I   PRE:  Rate/Rhythm: 89 SR  BP:  Supine:   Sitting: 116/70  Standing:    SaO2: 94 RA  MODE:  Ambulation: 1040 ft   POST:  Rate/Rhythem: 101 ST  BP:  Supine:   Sitting: 130/70  Standing:    SaO2: 97 RA 0853-0930 Tolerated ambulation well with hand held assist. Gait steady VS stable. Completed discharge education with pt.She agrees to [employer]. CRP in [place], will send referral.  Beatrix Fetters

## 2012-02-01 NOTE — Progress Notes (Addendum)
                   [address]            [place] [address]          [phone]     6 Days Post-Op  Procedure(s) (LRB): AORTIC VALVE REPLACEMENT (AVR) (N/A) REPAIR OF PATENT FORAMEN OVALE (N/A) Subjective: Feels better  Objective  Telemetry NSR, occas pvc's  Temp:  [97.4 F (36.3 C)-99.9 F (37.7 C)] 98.7 F (37.1 C) (04/02 0302) Pulse Rate:  [71-84] 71  (04/02 0302) Resp:  [18-20] 19  (04/02 0302) BP: (126-147)/(64-88) 126/86 mmHg (04/02 0302) SpO2:  [93 %-97 %] 93 % (04/02 0302)   Intake/Output Summary (Last 24 hours) at 02/01/12 0740 Last data filed at 01/31/12 1700  Gross per 24 hour  Intake    240 ml  Output      0 ml  Net    240 ml       General appearance: alert, cooperative and no distress Heart: regular rate and rhythm and S1, S2 normal Lungs: mildly diminished in bases Abdomen: soft, nontender, + BS Extremities: no edema Wound: incisions healing well  Lab Results:  Basename 01/30/12 0530  NA 139  K 3.6  CL 101  CO2 32  GLUCOSE 85  BUN 16  CREATININE 0.65  CALCIUM 9.0  MG --  PHOS --   No results found for this basename: AST:2,ALT:2,ALKPHOS:2,BILITOT:2,PROT:2,ALBUMIN:2 in the last 72 hours No results found for this basename: LIPASE:2,AMYLASE:2 in the last 72 hours  Basename 01/30/12 0530  WBC 7.6  NEUTROABS --  HGB 9.6*  HCT 28.8*  MCV 92.0  PLT 122*   No results found for this basename: CKTOTAL:4,CKMB:4,TROPONINI:4 in the last 72 hours No components found with this basename: POCBNP:3 No results found for this basename: DDIMER in the last 72 hours No results found for this basename: HGBA1C in the last 72 hours No results found for this basename: CHOL,HDL,LDLCALC,TRIG,CHOLHDL in the last 72 hours No results found for this basename: TSH,T4TOTAL,FREET3,T3FREE,THYROIDAB in the last 72 hours No results found for this basename: VITAMINB12,FOLATE,FERRITIN,TIBC,IRON,RETICCTPCT in the last 72  hours  Medications: Scheduled    . acetaminophen  1,000 mg Oral Q6H   Or  . acetaminophen (TYLENOL) oral liquid 160 mg/5 mL  975 mg Per Tube Q6H  . aspirin EC  325 mg Oral Daily  . bisacodyl  10 mg Oral Daily   Or  . bisacodyl  10 mg Rectal Daily  . chlorhexidine  15 mL Mouth/Throat BID  . docusate sodium  200 mg Oral Daily  . ferrous sulfate  325 mg Oral BID  . folic acid  1 mg Oral Daily  . furosemide  40 mg Oral Daily  . metoprolol tartrate  25 mg Oral BID  . pantoprazole  40 mg Oral Q1200  . polyethylene glycol  17 g Oral Daily  . potassium chloride  20 mEq Oral Daily     Radiology/Studies:  No results found.  INR: Will add last result for INR, ABG once components are confirmed Will add last 4 CBG results once components are confirmed  Assessment/Plan: S/P Procedure(s) (LRB): AORTIC VALVE REPLACEMENT (AVR) (N/A) REPAIR OF PATENT FORAMEN OVALE (N/A)  1. Doing well, monitor rhythm, did have episode of svt at 0200 2. Increase B Blocker  LOS: 6 days    GOLD,WAYNE E 4/2/20137:40 AM  Patient seen and examined. Feels well. Anxious to go home. D/c in AM

## 2012-02-01 NOTE — Progress Notes (Signed)
UR Completed.  Courtney Harvey Jane [phone] 02/01/2012  

## 2012-02-01 NOTE — Progress Notes (Signed)
Patient had a run of SVT this morning, asymptomatic. Strip flagged up in chart.  Currently NSR with HR 70s, vitals stable.  Will continue to monitor.  Arva Chafe

## 2012-02-01 NOTE — Consult Note (Signed)
Pt was seen on 01/10/12 during her last visit to the hospital. She went back home for 2 weeks and started on the 21 mg patch per my instructions. Pt was a 1 ppd smoker. Since this new admission to the hospital as of 01/24/12 she has not been on the patch at all. Advised pt to resume her patch regimen after d/c especially if she starts having cravings again which I anticipate she will. Again referred pt to 1-800 quit now for f/u and support. Discussed oral fixation substitutes, second hand smoke and in home smoking policy. Reviewed and gave pt Written education/contact information.

## 2012-02-02 ENCOUNTER — Encounter (HOSPITAL_COMMUNITY): Payer: Self-pay | Admitting: Physician Assistant

## 2012-02-02 NOTE — Progress Notes (Addendum)
[address]            [place] [address]          [phone]     7 Days Post-Op  Procedure(s) (LRB): AORTIC VALVE REPLACEMENT (AVR) (N/A) REPAIR OF PATENT FORAMEN OVALE (N/A) Subjective: Feels well, no new complaints  Objective  Telemetry SR, PVC's,PAC's  Temp:  [98.7 F (37.1 C)-99.8 F (37.7 C)] 98.7 F (37.1 C) (04/03 0650) Pulse Rate:  [79-88] 80  (04/03 0436) Resp:  [16-19] 16  (04/03 0436) BP: (130-140)/(82-96) 138/96 mmHg (04/03 0436) SpO2:  [95 %-96 %] 95 % (04/03 0436) Weight:  [155 lb 10.3 oz (70.6 kg)] 155 lb 10.3 oz (70.6 kg) (04/03 0436)   Intake/Output Summary (Last 24 hours) at 02/02/12 0743 Last data filed at 02/01/12 1700  Gross per 24 hour  Intake    720 ml  Output      0 ml  Net    720 ml       General appearance: alert, cooperative and no distress Heart: regular rate and rhythm, S1, S2 normal and soft sustolic murmur Lungs: clear to auscultation bilaterally Abdomen: benign Extremities: no edema Wound: incisions healing well  Lab Results: No results found for this basename: NA:2,K:2,CL:2,CO2:2,GLUCOSE:2,BUN:2,CREATININE:2,CALCIUM:2,MG:2,PHOS:2 in the last 72 hours No results found for this basename: AST:2,ALT:2,ALKPHOS:2,BILITOT:2,PROT:2,ALBUMIN:2 in the last 72 hours No results found for this basename: LIPASE:2,AMYLASE:2 in the last 72 hours No results found for this basename: WBC:2,NEUTROABS:2,HGB:2,HCT:2,MCV:2,PLT:2 in the last 72 hours No results found for this basename: CKTOTAL:4,CKMB:4,TROPONINI:4 in the last 72 hours No components found with this basename: POCBNP:3 No results found for this basename: DDIMER in the last 72 hours No results found for this basename: HGBA1C in the last 72 hours No results found for this basename: CHOL,HDL,LDLCALC,TRIG,CHOLHDL in the last 72 hours No results found for this basename: TSH,T4TOTAL,FREET3,T3FREE,THYROIDAB in the last 72 hours No results found for this  basename: VITAMINB12,FOLATE,FERRITIN,TIBC,IRON,RETICCTPCT in the last 72 hours  Medications: Scheduled    . aspirin EC  325 mg Oral Daily  . bisacodyl  10 mg Oral Daily   Or  . bisacodyl  10 mg Rectal Daily  . chlorhexidine  15 mL Mouth/Throat BID  . docusate sodium  200 mg Oral Daily  . ferrous sulfate  325 mg Oral BID  . folic acid  1 mg Oral Daily  . furosemide  40 mg Oral Daily  . metoprolol tartrate  50 mg Oral BID  . pantoprazole  40 mg Oral Q1200  . polyethylene glycol  17 g Oral Daily  . potassium chloride  20 mEq Oral Daily  . DISCONTD: metoprolol tartrate  25 mg Oral BID     Radiology/Studies:  No results found.  INR: Will add last result for INR, ABG once components are confirmed Will add last 4 CBG results once components are confirmed  Assessment/Plan: S/P Procedure(s) (LRB): AORTIC VALVE REPLACEMENT (AVR) (N/A) REPAIR OF PATENT FORAMEN OVALE (N/A)  1. D/C epw's , will discuss rhythm with cardiology and if they don't think we have to make any med changes will d/c today   LOS: 7 days    GOLD,WAYNE E 4/3/20137:43 AM    I have seen and examined the patient and agree with the assessment and plan as outlined.  Ready for d/c home today.  Rhythm is stable and okay.  I would not increase Metoprolol dose any further.  Varnika Butz H 02/02/2012 8:39 AM

## 2012-02-02 NOTE — Progress Notes (Signed)
Patient has been ambulating independently in the hallway throughout the day.

## 2012-02-02 NOTE — Discharge Summary (Signed)
I agree with the above discharge summary and plan for follow-up.  Juleon Narang H  

## 2012-02-02 NOTE — Progress Notes (Signed)
We were called to see patient in consult but she was discharged before consultation could be completed (had self-limiting episode of SVT on 02/01/12, few brief runs of what appeared to be atrial tach, mostly NSR with PACs/PVCs). Dr. Cornelius Moras made decision to discharge her earlier after determining that her rhythm was stable. I called our [place] office to ensure she has f/u scheduled - they will call her with an appointment. This was discussed with Dr. Riley Kill in its entirety.  Roland Prine PA-C

## 2012-02-02 NOTE — Progress Notes (Signed)
EPW's and CT sutures DC'd per order and unit protocol.  Pt tolerated well, all tips intact.  All sites painted and steri's applied.  Bedrest for one hour and HOB at 30 degrees, with call bell and all requested personal item in reach.  VSS, set for q61mins.  MT notified.  Will monitor closely.

## 2012-02-18 ENCOUNTER — Ambulatory Visit (INDEPENDENT_AMBULATORY_CARE_PROVIDER_SITE_OTHER): Payer: PRIVATE HEALTH INSURANCE | Admitting: Physician Assistant

## 2012-02-18 ENCOUNTER — Encounter: Payer: Self-pay | Admitting: Physician Assistant

## 2012-02-18 VITALS — BP 116/85 | HR 91 | Ht 66.5 in | Wt 151.0 lb

## 2012-02-18 DIAGNOSIS — Z954 Presence of other heart-valve replacement: Secondary | ICD-10-CM

## 2012-02-18 DIAGNOSIS — Z720 Tobacco use: Secondary | ICD-10-CM

## 2012-02-18 DIAGNOSIS — Z952 Presence of prosthetic heart valve: Secondary | ICD-10-CM

## 2012-02-18 DIAGNOSIS — I10 Essential (primary) hypertension: Secondary | ICD-10-CM

## 2012-02-18 DIAGNOSIS — F17200 Nicotine dependence, unspecified, uncomplicated: Secondary | ICD-10-CM

## 2012-02-18 DIAGNOSIS — R079 Chest pain, unspecified: Secondary | ICD-10-CM

## 2012-02-18 DIAGNOSIS — I350 Nonrheumatic aortic (valve) stenosis: Secondary | ICD-10-CM

## 2012-02-18 DIAGNOSIS — I359 Nonrheumatic aortic valve disorder, unspecified: Secondary | ICD-10-CM

## 2012-02-18 NOTE — Assessment & Plan Note (Signed)
Has since discontinued, following recent surgery.

## 2012-02-18 NOTE — Assessment & Plan Note (Signed)
Normal coronary arteries, by recent catheterization.

## 2012-02-18 NOTE — Progress Notes (Signed)
HPI: Patient presents for post hospital followup, and to reestablish here in our [facility] clinic after a long hiatus, previously followed by Dr. Myrtis Ser.  She was recently seen in consultation by Dr. Kirke Corin, here at [facility], for evaluation of chest pain. She presented with history of nonobstructive CAD, by prior catheterization in 2005. She also had history of moderate AS. Following a 2-D echo which yielded severe AS/moderate AR, arrangements were made for transfer to [facility] for cardiac catheterization, performed by Dr. [place] Emms, which yielded normal coronaries; EF 60%, no WMAs. A TEE indicated trileaflet AV (previously assessed as bicuspid), as well as a large PFO. Arrangements were then made for patient to undergo elective surgery, successfully performed on 3/27, by Dr. Cornelius Moras: Aortic Valve Replacement utilizing a 21mm [employer] features editor Pericardial Tissue Valve and Closure of Patent Foramen Ovale. Of note, patient is a [employer], and no blood products were administered.  Patient has done well postoperatively. She cites decreased DOE. She also has noted improvement in prior persistent bilateral hand numbness. She has also stopped smoking.  Patient cites 1 residual small open wound, where a Steri-Strip recently fell off, with otherwise well-healed incisions.   Allergies  Allergen Reactions  . Peanut-Containing Drug Products Other (See Comments)    headache  . Wellbutrin (Bupropion Hcl)     itching    Current Outpatient Prescriptions  Medication Sig Dispense Refill  . aspirin EC 325 MG EC tablet Take 1 tablet (325 mg total) by mouth daily.  30 tablet    . docusate sodium (COLACE) 100 MG capsule Take 100 mg by mouth daily.      . ferrous sulfate 325 (65 FE) MG tablet Take 325 mg by mouth 2 (two) times daily.      . folic acid (FOLVITE) 800 MCG tablet Take 800 mcg by mouth daily.      Marland Kitchen loratadine (CLARITIN) 10 MG tablet Take 10 mg by mouth daily as needed. For allergy      . metoprolol tartrate  (LOPRESSOR) 25 MG tablet Take 1 tablet (25 mg total) by mouth 2 (two) times daily.  60 tablet  1  . oxyCODONE (OXY IR/ROXICODONE) 5 MG immediate release tablet Take 5-10 mg by mouth every 3 (three) hours as needed.        Past Medical History  Diagnosis Date  . Recurrent upper respiratory infection (URI)   . H/O hiatal hernia   . Dysrhythmia   . Aortic insufficiency and aortic stenosis     AR/AS by echo/cath 3/013 [name]) s/p tissue AVR 01/26/12 (EF 55-60%, no CAD by cath at that time)  . Chronic diastolic heart failure   . Hypertension 01/11/2012  . Tobacco abuse   . S/P aortic valve replacement 01/26/2012    21mm [facility] Ease pericardial tissue valve  . S/P patent foramen ovale closure 01/26/2012    PFO found on TEE prior to AVR. Performed at the time of aortic valve replacement  . Chest pain     Normal coronaries; EF 60%, cardiac catheterization, 3/12    Past Surgical History  Procedure Date  . Tee without cardioversion 01/10/2012    Procedure: TRANSESOPHAGEAL ECHOCARDIOGRAM (TEE);  Surgeon: Pricilla Riffle, MD;  Location: [facility] ENDOSCOPY;  Service: Cardiovascular;  Laterality: N/A;  TEE call Trish in am for time  . Cardiac catheterization 2005 and 2013    Friday March 8th       pcp Dr tapper  in [place]       . Aortic valve  replacement 01/26/2012    Procedure: AORTIC VALVE REPLACEMENT (AVR);  Surgeon: Purcell Nails, MD;  Location: [facility] OR;  Service: Open Heart Surgery;  Laterality: N/A;    History   Social History  . Marital Status: Single    Spouse Name: N/A    Number of Children: 2  . Years of Education: N/A   Occupational History  .  [employer]   Social History Main Topics  . Smoking status: Former Smoker -- 1.0 packs/day for 40 years    Types: Cigarettes    Quit date: 01/06/2012  . Smokeless tobacco: Never Used  . Alcohol Use: No  . Drug Use: No  . Sexually Active: Not Currently    Birth Control/ Protection: Post-menopausal   Other Topics Concern  . Not  on file   Social History Narrative   She is from [place], [place]. She is widowed. Her husband committed suicide sometime last year, but she feels that she has dealt with it. She currently lives alone and works at [employer].     Family History  Problem Relation Age of Onset  . Lung cancer Father   . Uterine cancer Mother     ROS: no nausea, vomiting; no fever, chills; no melena, hematochezia; no claudication  PHYSICAL EXAM: BP 116/85  Pulse 91  Ht 5' 6.5" (1.689 m)  Wt 151 lb (68.493 kg)  BMI 24.01 kg/m2  SpO2 98% GENERAL: 56 year old female; NAD HEENT: NCAT, PERRLA, EOMI; sclera clear; no xanthelasma NECK: palpable bilateral carotid pulses, no bruits; no JVD; no TM LUNGS: CTA bilaterally CARDIAC: RRR (S1, S2); soft, grade 1-2/6 short SEM; no rubs or gallops ABDOMEN: soft, non-tender; intact BS EXTREMETIES: intact distal pulses; no significant peripheral edema SKIN: Isolated open wound below right costal margin; no peri incisional erythema, or pus; otherwise well healed midline incision MUSCULOSKELETAL: no joint deformity NEURO: no focal deficit; NL affect   EKG: reviewed and available in Electronic Records   ASSESSMENT & PLAN:

## 2012-02-18 NOTE — Assessment & Plan Note (Signed)
Well-controlled on current regimen. ?

## 2012-02-18 NOTE — Patient Instructions (Signed)
Continue all current medications. Follow up in  3 months 

## 2012-02-18 NOTE — Assessment & Plan Note (Signed)
Patient doing well following recent surgery. Will have RN apply topical antibiotic ointment to singular open wound, which appears benign. Patient otherwise healing nicely, and is scheduled to follow with Dr. Cornelius Moras, April 29.

## 2012-02-24 ENCOUNTER — Other Ambulatory Visit: Payer: Self-pay | Admitting: Thoracic Surgery (Cardiothoracic Vascular Surgery)

## 2012-02-24 DIAGNOSIS — I350 Nonrheumatic aortic (valve) stenosis: Secondary | ICD-10-CM

## 2012-02-24 DIAGNOSIS — I359 Nonrheumatic aortic valve disorder, unspecified: Secondary | ICD-10-CM

## 2012-02-28 ENCOUNTER — Encounter: Payer: Self-pay | Admitting: Thoracic Surgery (Cardiothoracic Vascular Surgery)

## 2012-02-28 ENCOUNTER — Ambulatory Visit
Admission: RE | Admit: 2012-02-28 | Discharge: 2012-02-28 | Disposition: A | Discharge status: Home / Self Care | Payer: PRIVATE HEALTH INSURANCE | Source: Ambulatory Visit | Attending: Thoracic Surgery (Cardiothoracic Vascular Surgery) | Admitting: Thoracic Surgery (Cardiothoracic Vascular Surgery)

## 2012-02-28 ENCOUNTER — Ambulatory Visit (INDEPENDENT_AMBULATORY_CARE_PROVIDER_SITE_OTHER): Payer: Self-pay | Admitting: Thoracic Surgery (Cardiothoracic Vascular Surgery)

## 2012-02-28 VITALS — BP 132/92 | HR 74 | Resp 18 | Ht 66.5 in | Wt 152.0 lb

## 2012-02-28 DIAGNOSIS — Z954 Presence of other heart-valve replacement: Secondary | ICD-10-CM

## 2012-02-28 DIAGNOSIS — Z8774 Personal history of (corrected) congenital malformations of heart and circulatory system: Secondary | ICD-10-CM

## 2012-02-28 DIAGNOSIS — Z952 Presence of prosthetic heart valve: Secondary | ICD-10-CM

## 2012-02-28 DIAGNOSIS — I359 Nonrheumatic aortic valve disorder, unspecified: Secondary | ICD-10-CM

## 2012-02-28 DIAGNOSIS — Z9889 Other specified postprocedural states: Secondary | ICD-10-CM

## 2012-02-28 NOTE — Progress Notes (Signed)
[address]            [place] [address]          [phone]     CARDIOTHORACIC SURGERY OFFICE NOTE  Referring Provider is Pricilla Riffle, MD PCP is Louie Boston, MD, MD   HPI:  Patient returns for followup one month status post aortic valve replacement and closure of patent foramen ovale. Her postoperative recovery has been uncomplicated. Since hospital discharge she has continued to do very well. She has mild residual soreness in her chest that is slowly improving. She only takes pain medication at bedtime to help arrest. She has no shortness of breath.  Her appetite is pretty good. Overall she has no complaints.   Current Outpatient Prescriptions  Medication Sig Dispense Refill  . aspirin EC 325 MG EC tablet Take 1 tablet (325 mg total) by mouth daily.  30 tablet    . docusate sodium (COLACE) 100 MG capsule Take 100 mg by mouth daily.      . ferrous sulfate 325 (65 FE) MG tablet Take 325 mg by mouth 2 (two) times daily.      . folic acid (FOLVITE) 800 MCG tablet Take 800 mcg by mouth daily.      Marland Kitchen loratadine (CLARITIN) 10 MG tablet Take 10 mg by mouth daily as needed. For allergy      . metoprolol tartrate (LOPRESSOR) 25 MG tablet Take 1 tablet (25 mg total) by mouth 2 (two) times daily.  60 tablet  1  . oxyCODONE (OXY IR/ROXICODONE) 5 MG immediate release tablet Take 5-10 mg by mouth every 3 (three) hours as needed.          Physical Exam:   BP 132/92  Pulse 74  Resp 18  Ht 5' 6.5" (1.689 m)  Wt 152 lb (68.947 kg)  BMI 24.17 kg/m2  SpO2 97%  General:  Well-appearing  Chest:   Clear to auscultation  CV:   Regular rate and rhythm without murmur  Incisions:  Clean and dry and healing nicely. Sternum is stable  Abdomen:  Soft and nontender  Extremities:  Warm and well-perfused  Diagnostic Tests:  *RADIOLOGY REPORT*  Clinical Data: Status post recent heart surgery.  CHEST - 2 VIEW 02/28/2012 Comparison: 01/28/2012.  Findings:  Trachea is midline. Sternotomy wires are unchanged in  position. Heart size normal. Biapical pleural parenchymal scarring  with mild bullous changes. Lungs are otherwise clear. No pleural  fluid. No pneumothorax.  IMPRESSION:  No acute findings.  Original Report Authenticated By: Reyes Ivan, M.D.    Impression:  Patient is doing very well now one month following aortic valve replacement and closure of patent foramen ovale. Her aortic valve was replaced using a bioprosthetic tissue valve.   Plan:  I've encouraged patient to continue to gradually increase her physical activity as tolerated with her only limitation at this point remaining that she refrain from heavy lifting or strenuous use of her arms or shoulders for at least another 2 months. I think she can resume driving an automobile. We've talked about when she might be able to go back to work. Was in 3 months from the time of surgery she will not have any restrictions. She could potentially go back to work 2 or 3 weeks before that as long as she could avoid any heavy lifting or strenuous use of her arms and  shoulders.  All of her questions been addressed.   Salvatore Decent. Cornelius Moras, MD 02/28/2012 2:22 PM

## 2012-02-28 NOTE — Patient Instructions (Signed)
The patient has been instructed that they may return driving an automobile as long as they are no longer requiring oral narcotic pain relievers during the daytime.  They have been advised to start driving short distances during the daylight and gradually increase from there as they feel comfortable. The patient has been reminded to continue to avoid any heavy lifting or strenuous use of arms or shoulders for at least a total of three months from the time of surgery. . 

## 2012-03-06 ENCOUNTER — Other Ambulatory Visit: Payer: Self-pay | Admitting: *Deleted

## 2012-03-06 DIAGNOSIS — G8918 Other acute postprocedural pain: Secondary | ICD-10-CM

## 2012-03-06 MED ORDER — HYDROCODONE-ACETAMINOPHEN 7.5-500 MG PO TABS: 1.0000 | ORAL_TABLET | Freq: Four times a day (QID) | ORAL | Status: AC | PRN

## 2012-03-23 ENCOUNTER — Telehealth: Payer: Self-pay | Admitting: *Deleted

## 2012-03-23 NOTE — Telephone Encounter (Signed)
Patient called to say that her BP is elevated 160's/100's which was check by her daughter. Patient c/o having some chest pain also. Patient denied having dizziness or shortness of breath. Nurse inquired if patient had seen her PCP about his already. Patient stated she hasn't seen Dr. Margo Common in a while. Nurse advised patient that she had a scheduled appointment tomorrow at our office but since she was now having chest pain since her appointment was schedule she needed to go to ED to be evaluated. Patient verbalized understanding of plan.

## 2012-03-24 ENCOUNTER — Encounter: Payer: Self-pay | Admitting: Physician Assistant

## 2012-03-24 ENCOUNTER — Ambulatory Visit (INDEPENDENT_AMBULATORY_CARE_PROVIDER_SITE_OTHER): Payer: PRIVATE HEALTH INSURANCE | Admitting: Physician Assistant

## 2012-03-24 VITALS — BP 128/93 | HR 91 | Ht 66.5 in | Wt 154.8 lb

## 2012-03-24 DIAGNOSIS — I10 Essential (primary) hypertension: Secondary | ICD-10-CM

## 2012-03-24 NOTE — Assessment & Plan Note (Signed)
No further recommendations, at this point in time. Patient is to increase Lopressor to 50 twice a day, as just advised by Dr. Margo Common. I suspect that she is experiencing significant stress and anxiety, given recent cardiac surgery. In this context, therefore, a beta blocker medication might be most effective. We will see how her BP stabilizes over time, and will reschedule her followup visit to 4 months, with Dr. Myrtis Ser.

## 2012-03-24 NOTE — Patient Instructions (Signed)
Your physician recommends that you schedule a follow-up appointment in: 4 months. You will receive a reminder letter in the mail in about 2-3 months reminding you to call and schedule your appointment. If you don't receive this letter, please contact our office.   Your physician recommends that you continue on your current medications as directed. Please refer to the Current Medication list given to you today.

## 2012-03-24 NOTE — Progress Notes (Signed)
HPI: Patient seen as an add-on for evaluation of persistent HTN.  She reportedly had a systolic in the 160 range yesterday morning, which precluded her from proceeding with scheduled cardiac rehabilitation session. She then presented to Dr. Jackolyn Confer office, and had a similar BP reading. She returned to Dr. Margo Common only a few hours ago, and was instructed to increase Lopressor to 50 twice a day. This apparently will start this evening, as she took her usual 25 mg dose this morning. While in his office, BP reportedly was 128 systolic. Of note, she also presented to the ED last evening c/o elevated BP, CP, and rapid HR. She presented with BP 175/119, pulse 92, and was afebrile. EKG: NSR 81, with NSST changes. She was treated with 5 mg IV Lopressor and 1 mg IV Ativan, with complete resolution of symptoms.  Patient states that she has no prior history of HTN, and wondered if this was due to recent change in her pain medication, which she began this past Sunday evening.    Allergies  Allergen Reactions  . Peanut-Containing Drug Products Other (See Comments)    headache  . Wellbutrin (Bupropion Hcl)     itching    Current Outpatient Prescriptions  Medication Sig Dispense Refill  . aspirin EC 325 MG EC tablet Take 1 tablet (325 mg total) by mouth daily.  30 tablet    . HYDROcodone-acetaminophen (LORTAB) 7.5-500 MG per tablet Take 1-2 tablets by mouth. Every 4-6 hrs as needed for pain.      Marland Kitchen loratadine (CLARITIN) 10 MG tablet Take 10 mg by mouth daily as needed. For allergy      . metoprolol tartrate (LOPRESSOR) 25 MG tablet Take 50 mg by mouth 2 (two) times daily.      Marland Kitchen DISCONTD: metoprolol tartrate (LOPRESSOR) 25 MG tablet Take 1 tablet (25 mg total) by mouth 2 (two) times daily.  60 tablet  1    Past Medical History  Diagnosis Date  . Recurrent upper respiratory infection (URI)   . H/O hiatal hernia   . Dysrhythmia   . Aortic insufficiency and aortic stenosis     AR/AS by echo/cath 3/013  [name]) s/p tissue AVR 01/26/12 (EF 55-60%, no CAD by cath at that time)  . Chronic diastolic heart failure   . Hypertension 01/11/2012  . Tobacco abuse   . S/P aortic valve replacement 01/26/2012    21mm [facility] Ease pericardial tissue valve  . S/P patent foramen ovale closure 01/26/2012    PFO found on TEE prior to AVR. Performed at the time of aortic valve replacement  . Chest pain     Normal coronaries; EF 60%, cardiac catheterization, 3/12    Past Surgical History  Procedure Date  . Tee without cardioversion 01/10/2012    Procedure: TRANSESOPHAGEAL ECHOCARDIOGRAM (TEE);  Surgeon: Pricilla Riffle, MD;  Location: [facility] ENDOSCOPY;  Service: Cardiovascular;  Laterality: N/A;  TEE call Trish in am for time  . Cardiac catheterization 2005 and 2013    Friday March 8th       pcp Dr tapper  in [place]       . Aortic valve replacement 01/26/2012    Procedure: AORTIC VALVE REPLACEMENT (AVR);  Surgeon: Purcell Nails, MD;  Location: [facility] OR;  Service: Open Heart Surgery;  Laterality: N/A;    History   Social History  . Marital Status: Single    Spouse Name: N/A    Number of Children: 2  . Years of Education: N/A  Occupational History  .  [employer]   Social History Main Topics  . Smoking status: Former Smoker -- 1.0 packs/day for 40 years    Types: Cigarettes    Quit date: 01/06/2012  . Smokeless tobacco: Never Used  . Alcohol Use: No  . Drug Use: No  . Sexually Active: Not Currently    Birth Control/ Protection: Post-menopausal   Other Topics Concern  . Not on file   Social History Narrative   She is from [place], [place]. She is widowed. Her husband committed suicide sometime last year, but she feels that she has dealt with it. She currently lives alone and works at [employer]     Family History  Problem Relation Age of Onset  . Lung cancer Father   . Uterine cancer Mother     ROS: no nausea, vomiting; no fever, chills; no melena, hematochezia; no  claudication  PHYSICAL EXAM: BP 128/93  Pulse 91  Ht 5' 6.5" (1.689 m)  Wt 154 lb 12.8 oz (70.217 kg)  BMI 24.61 kg/m2 GENERAL: 56 year old female; NAD  HEENT: NCAT, PERRLA, EOMI; sclera clear; no xanthelasma  NECK: palpable bilateral carotid pulses, no bruits; no JVD; no TM  LUNGS: CTA bilaterally  CARDIAC: RRR (S1, S2); soft, grade 1-2/6 short SEM; no rubs or gallops  ABDOMEN: soft, non-tender; intact BS  EXTREMETIES: intact distal pulses; no significant peripheral edema  SKIN: warm and dry  MUSCULOSKELETAL: no joint deformity  NEURO: no focal deficit; NL affect    EKG:    ASSESSMENT & PLAN:  Hypertension No further recommendations, at this point in time. Patient is to increase Lopressor to 50 twice a day, as just advised by Dr. Margo Common. I suspect that she is experiencing significant stress and anxiety, given recent cardiac surgery. In this context, therefore, a beta blocker medication might be most effective. We will see how her BP stabilizes over time, and will reschedule her followup visit to 4 months, with Dr. Myrtis Ser.     Gene Kariana Wiles, PAC

## 2012-04-11 ENCOUNTER — Telehealth: Payer: Self-pay | Admitting: Cardiology

## 2012-04-11 NOTE — Telephone Encounter (Signed)
Patient called and asked about returning to work.  She would like to know when she can go back.  Dr. Barry Dienes (per patient) released her to go back to work on 04/27/2012.  She has completed 13 days of the 36 days of rehab.  Advised to patient there is nothing on last note with PA that indicated any return to work was discussed.  Advised I would forward note to nurse to inquire if any restrictions.

## 2012-04-12 NOTE — Telephone Encounter (Signed)
Patient informed. 

## 2012-04-12 NOTE — Telephone Encounter (Signed)
Pt is cleared to return to work.

## 2012-04-18 ENCOUNTER — Telehealth: Payer: Self-pay | Admitting: Cardiology

## 2012-04-18 ENCOUNTER — Encounter: Payer: Self-pay | Admitting: *Deleted

## 2012-04-18 NOTE — Telephone Encounter (Signed)
PT WOULD LIKE A LETTER TO GO BACK TO WORK ON 05/01/12, TO TURN IN THO HER WORK. PLEASE CALL HER WITH ANY QUESTIONS.

## 2012-04-18 NOTE — Telephone Encounter (Signed)
Patient informed via voicemail that note for work will be left upfront for pick up.

## 2012-05-16 ENCOUNTER — Ambulatory Visit: Payer: Self-pay | Admitting: Cardiology

## 2012-05-22 ENCOUNTER — Ambulatory Visit (INDEPENDENT_AMBULATORY_CARE_PROVIDER_SITE_OTHER): Payer: PRIVATE HEALTH INSURANCE | Admitting: Thoracic Surgery (Cardiothoracic Vascular Surgery)

## 2012-05-22 ENCOUNTER — Encounter: Payer: Self-pay | Admitting: Thoracic Surgery (Cardiothoracic Vascular Surgery)

## 2012-05-22 VITALS — BP 140/90 | HR 60 | Temp 98.0°F | Resp 18 | Ht 66.5 in | Wt 155.0 lb

## 2012-05-22 DIAGNOSIS — Z954 Presence of other heart-valve replacement: Secondary | ICD-10-CM

## 2012-05-22 DIAGNOSIS — Z952 Presence of prosthetic heart valve: Secondary | ICD-10-CM

## 2012-05-22 NOTE — Patient Instructions (Signed)
Patient has been advised that she may continue to increase her physical activity as tolerated without any particular limitations at this time.

## 2012-05-22 NOTE — Progress Notes (Addendum)
                   [address]            [place] [address]          [id number]     CARDIOTHORACIC SURGERY OFFICE NOTE  Referring Provider is Charlton Haws, MD Primary Cardiologist is Willa Rough, MD PCP is TAPPER,DAVID B, MD   HPI:  Patient returns for followup status post Aortic Valve Replacement and Closure of Patent Foramen Ovale.  They were last seen here in the office on 02/28/2012. Patient complains of edema in lower left leg, some minor pain from soreness in the chest around the incisions that are exacerbated with physical exertion. Overall she's doing very well. She notes that her breathing is better than it was prior to surgery. Her exercise tolerance continues to gradually improve and she is been participating in the cardiac rehabilitation program. She does note that she feels tired and this seems to coincide with her increased dose of metoprolol.   Current Outpatient Prescriptions  Medication Sig Dispense Refill  . aspirin EC 325 MG EC tablet Take 1 tablet (325 mg total) by mouth daily.  30 tablet    . loratadine (CLARITIN) 10 MG tablet Take 10 mg by mouth daily as needed. For allergy      . metoprolol tartrate (LOPRESSOR) 25 MG tablet Take 50 mg by mouth 2 (two) times daily.      Marland Kitchen HYDROcodone-acetaminophen (LORTAB) 7.5-500 MG per tablet Take 1-2 tablets by mouth. Every 4-6 hrs as needed for pain.          Physical Exam:   BP 140/90  Pulse 60  Temp 98 F (36.7 C)  Resp 18  Ht 5' 6.5" (1.689 m)  Wt 155 lb (70.308 kg)  BMI 24.64 kg/m2  SpO2 98%  General:  Overall patient is well appearing  Chest:   Clear and symmetrical, has improved since last visit  CV:   RRR with soft systolic murmur best LSB  Incisions:  Healed nicely  Abdomen:  soft  Extremities:  warm  Diagnostic Tests:  n/a   Impression:  Patient is doing very well 3-1/2 months following aortic valve replacement and closure of patent foramen ovale. Her physical activity and  endurance continues to improve nicely. She has minor residual soreness in her chest which is not bothering her much at all. She does seem to feel tired a lot more than she used to, and this seems to coincide with her increased dose of beta blocker.  Plan:  I've advised the patient that she may continue to increase her physical activity as tolerated without any particular limitations at this time. She is scheduled to see Dr. Myrtis Ser for followup within the next couple of weeks. We will leave any decisions regarding long-term management of hypertension to his discretion. It might be reasonable to consider cutting back her dose of beta blocker and starting her on ACE inhibitor or ARB.  At some point a followup echocardiogram will need to be checked. In the future the patient will call and return to see [place] here as needed only should further problems or difficulties arise.    Salvatore Decent. Cornelius Moras, MD 05/22/2012 10:30 AM

## 2012-07-16 ENCOUNTER — Encounter: Payer: Self-pay | Admitting: Cardiology

## 2012-07-16 DIAGNOSIS — R079 Chest pain, unspecified: Secondary | ICD-10-CM | POA: Insufficient documentation

## 2012-07-16 DIAGNOSIS — I5032 Chronic diastolic (congestive) heart failure: Secondary | ICD-10-CM | POA: Insufficient documentation

## 2012-07-16 DIAGNOSIS — J069 Acute upper respiratory infection, unspecified: Secondary | ICD-10-CM | POA: Insufficient documentation

## 2012-07-19 ENCOUNTER — Encounter: Payer: Self-pay | Admitting: Cardiology

## 2012-07-19 ENCOUNTER — Ambulatory Visit (INDEPENDENT_AMBULATORY_CARE_PROVIDER_SITE_OTHER): Payer: PRIVATE HEALTH INSURANCE | Admitting: Cardiology

## 2012-07-19 VITALS — BP 137/83 | HR 61 | Ht 66.5 in | Wt 157.0 lb

## 2012-07-19 DIAGNOSIS — Z952 Presence of prosthetic heart valve: Secondary | ICD-10-CM

## 2012-07-19 DIAGNOSIS — Z8719 Personal history of other diseases of the digestive system: Secondary | ICD-10-CM | POA: Insufficient documentation

## 2012-07-19 DIAGNOSIS — F17200 Nicotine dependence, unspecified, uncomplicated: Secondary | ICD-10-CM

## 2012-07-19 DIAGNOSIS — Z720 Tobacco use: Secondary | ICD-10-CM

## 2012-07-19 DIAGNOSIS — I352 Nonrheumatic aortic (valve) stenosis with insufficiency: Secondary | ICD-10-CM | POA: Insufficient documentation

## 2012-07-19 DIAGNOSIS — Z954 Presence of other heart-valve replacement: Secondary | ICD-10-CM

## 2012-07-19 DIAGNOSIS — R9430 Abnormal result of cardiovascular function study, unspecified: Secondary | ICD-10-CM | POA: Insufficient documentation

## 2012-07-19 DIAGNOSIS — I10 Essential (primary) hypertension: Secondary | ICD-10-CM

## 2012-07-19 NOTE — Patient Instructions (Addendum)

## 2012-07-19 NOTE — Assessment & Plan Note (Signed)
Blood pressures control. No change in therapy. 

## 2012-07-19 NOTE — Assessment & Plan Note (Signed)
The patient quit smoking at the time of the diagnosis of her aortic valvular disease.

## 2012-07-19 NOTE — Assessment & Plan Note (Signed)
Patient is doing extremely well after her tissue aortic valve replacement. No further workup is needed. She also had a PFO closed and she is doing well.

## 2012-07-19 NOTE — Progress Notes (Signed)
HPI  Patient is seen today to followup aortic valve replacement and hypertension. I had met her when she was first admitted to [facility] and we diagnosed her aortic valvular disease. She underwent aortic valve replacement with a tissue valve by Dr.Owen in March, 2013. She's done very well. She has been released by the heart surgery team. She's going about full activities.  Allergies  Allergen Reactions  . Peanut-Containing Drug Products Other (See Comments)    headache  . Wellbutrin (Bupropion Hcl)     itching    Current Outpatient Prescriptions  Medication Sig Dispense Refill  . aspirin EC 325 MG EC tablet Take 1 tablet (325 mg total) by mouth daily.  30 tablet    . loratadine (CLARITIN) 10 MG tablet Take 10 mg by mouth daily as needed. For allergy      . metoprolol tartrate (LOPRESSOR) 25 MG tablet Take 50 mg by mouth 2 (two) times daily.        History   Social History  . Marital Status: Single    Spouse Name: N/A    Number of Children: 2  . Years of Education: N/A   Occupational History  .  [employer]   Social History Main Topics  . Smoking status: Former Smoker -- 1.0 packs/day for 40 years    Types: Cigarettes    Quit date: 01/06/2012  . Smokeless tobacco: Never Used  . Alcohol Use: No  . Drug Use: No  . Sexually Active: Not Currently    Birth Control/ Protection: Post-menopausal   Other Topics Concern  . Not on file   Social History Narrative   She is from [place], [place]. She is widowed. Her husband committed suicide sometime last year, but she feels that she has dealt with it. She currently lives alone and works at [employer].     Family History  Problem Relation Age of Onset  . Lung cancer Father   . Uterine cancer Mother     Past Medical History  Diagnosis Date  . Recurrent upper respiratory infection (URI)   . H/O hiatal hernia   . Aortic insufficiency and aortic stenosis     AR/AS by echo/cath 3/013 Courtney Harvey) s/p tissue AVR  01/26/12 (EF 55-60%, no CAD by cath at that time)  . Chronic diastolic heart failure   . Hypertension 01/11/2012  . Tobacco abuse   . S/P aortic valve replacement 01/26/2012     12/2011,  21mm [facility] Ease pericardial tissue valve  . S/P patent foramen ovale closure 01/26/2012     12/2011,   PFO found on TEE prior to AVR. Performed at the time of aortic valve replacement  . Chest pain syndrome     Normal coronaries; EF 60%, cardiac catheterization, 3/12  . Ejection fraction     EF 55-60%, echo, January 07, 2012, EF was also normal at the time of catheterization March, 2013    Past Surgical History  Procedure Date  . Tee without cardioversion 01/10/2012    Procedure: TRANSESOPHAGEAL ECHOCARDIOGRAM (TEE);  Surgeon: Pricilla Riffle, MD;  Location: [facility] ENDOSCOPY;  Service: Cardiovascular;  Laterality: N/A;  TEE call Trish in am for time  . Cardiac catheterization 2005 and 2013    Friday March 8th       pcp Dr tapper  in [place]       . Aortic valve replacement 01/26/2012    Procedure: AORTIC VALVE REPLACEMENT (AVR);  Surgeon: Purcell Nails, MD;  Location: [facility] OR;  Service: Open Heart Surgery;  Laterality: N/A;    ROS   Patient denies fever, chills, headache, sweats, rash, change in vision, change in hearing, chest pain, cough, nausea vomiting, urinary symptoms. All other systems are reviewed and are negative.  PHYSICAL EXAM  Patient is oriented to person time and place. Affect is normal. Lungs are clear. Respiratory effort is nonlabored. Cardiac exam reveals S1 and S2. There are no clicks or significant murmurs. Her mediastinal  Scar is stable. She has slight excess tissue at the base of the scar but it does not bother her. Abdomen is soft. There is no peripheral edema. There are no musculoskeletal deformities.  Filed Vitals:   07/19/12 1028  BP: 137/83  Pulse: 61  Height: 5' 6.5" (1.689 m)  Weight: 157 lb (71.215 kg)  SpO2: 98%     ASSESSMENT & PLAN

## 2012-11-22 IMAGING — CR DG CHEST 2V
2 series · 2 of 2 positions shown · non-contrast
Comparison: 01/09/2012.

CLINICAL DATA: Preop.

CHEST - 2 VIEW

[view not recorded (1 of 2)]
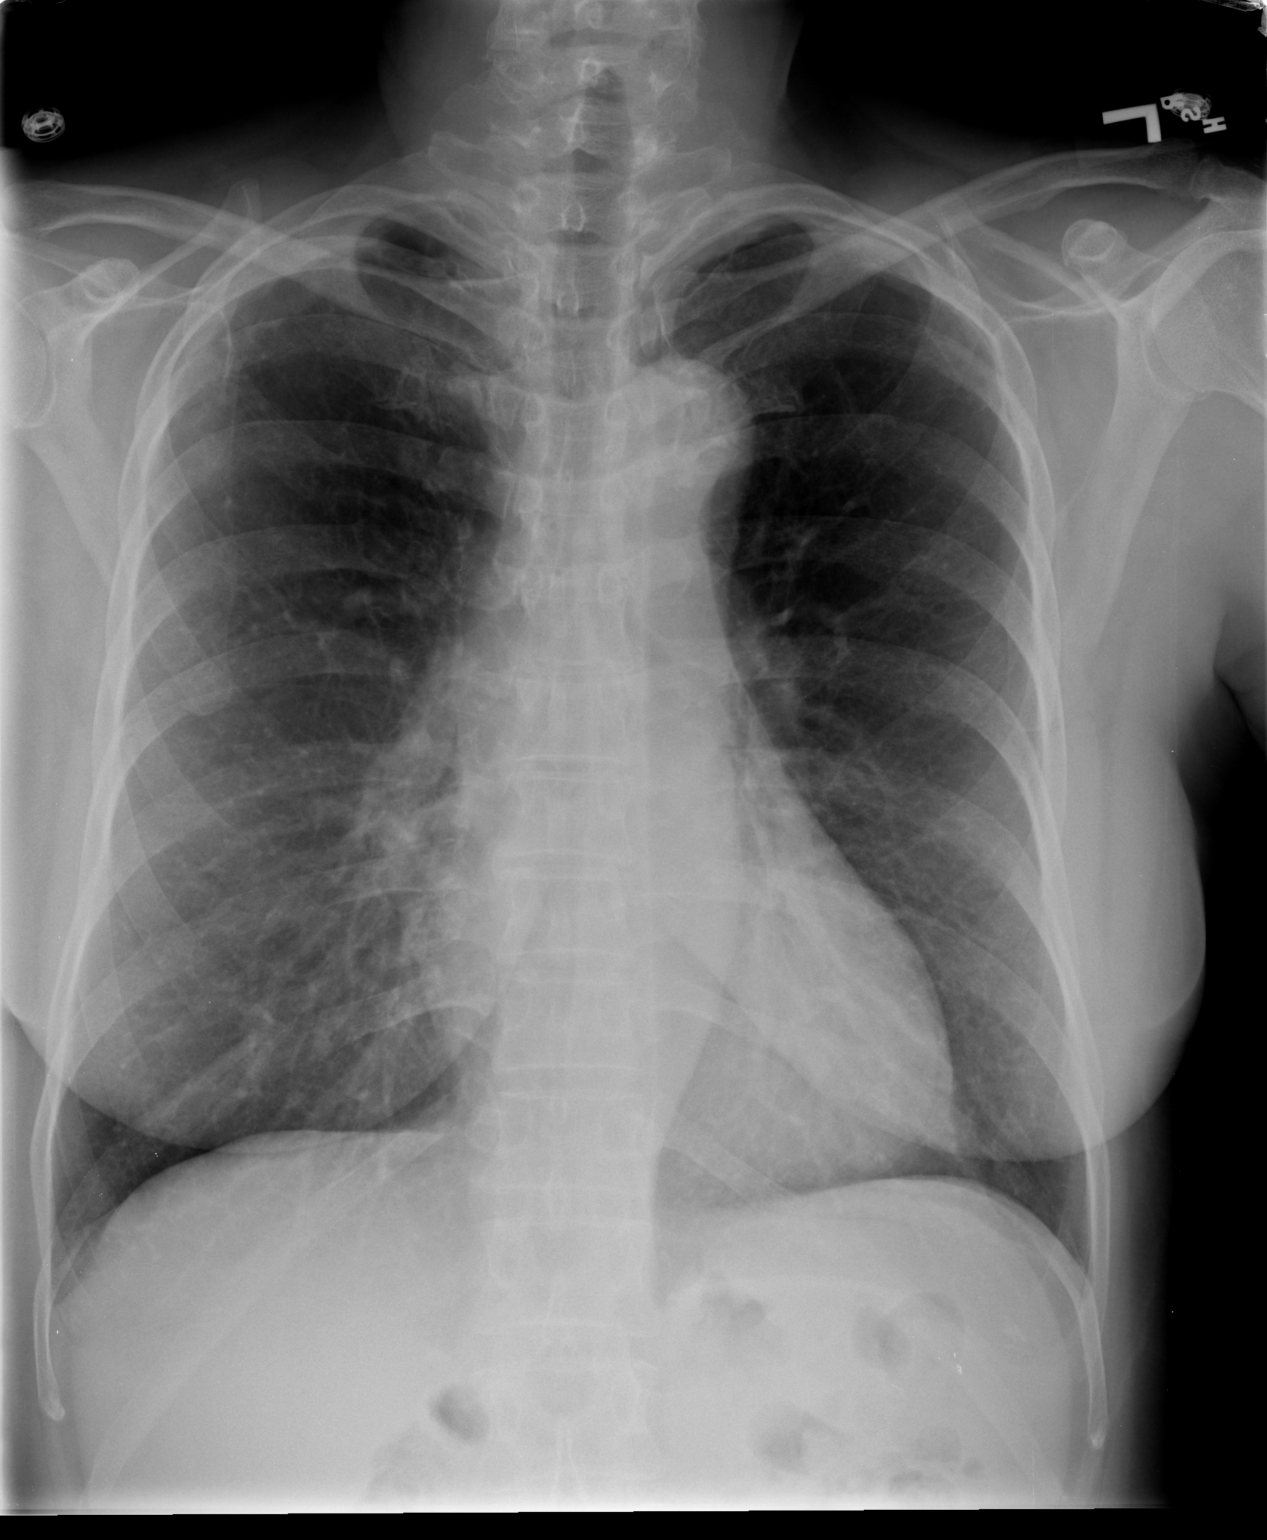

[view not recorded (2 of 2)]
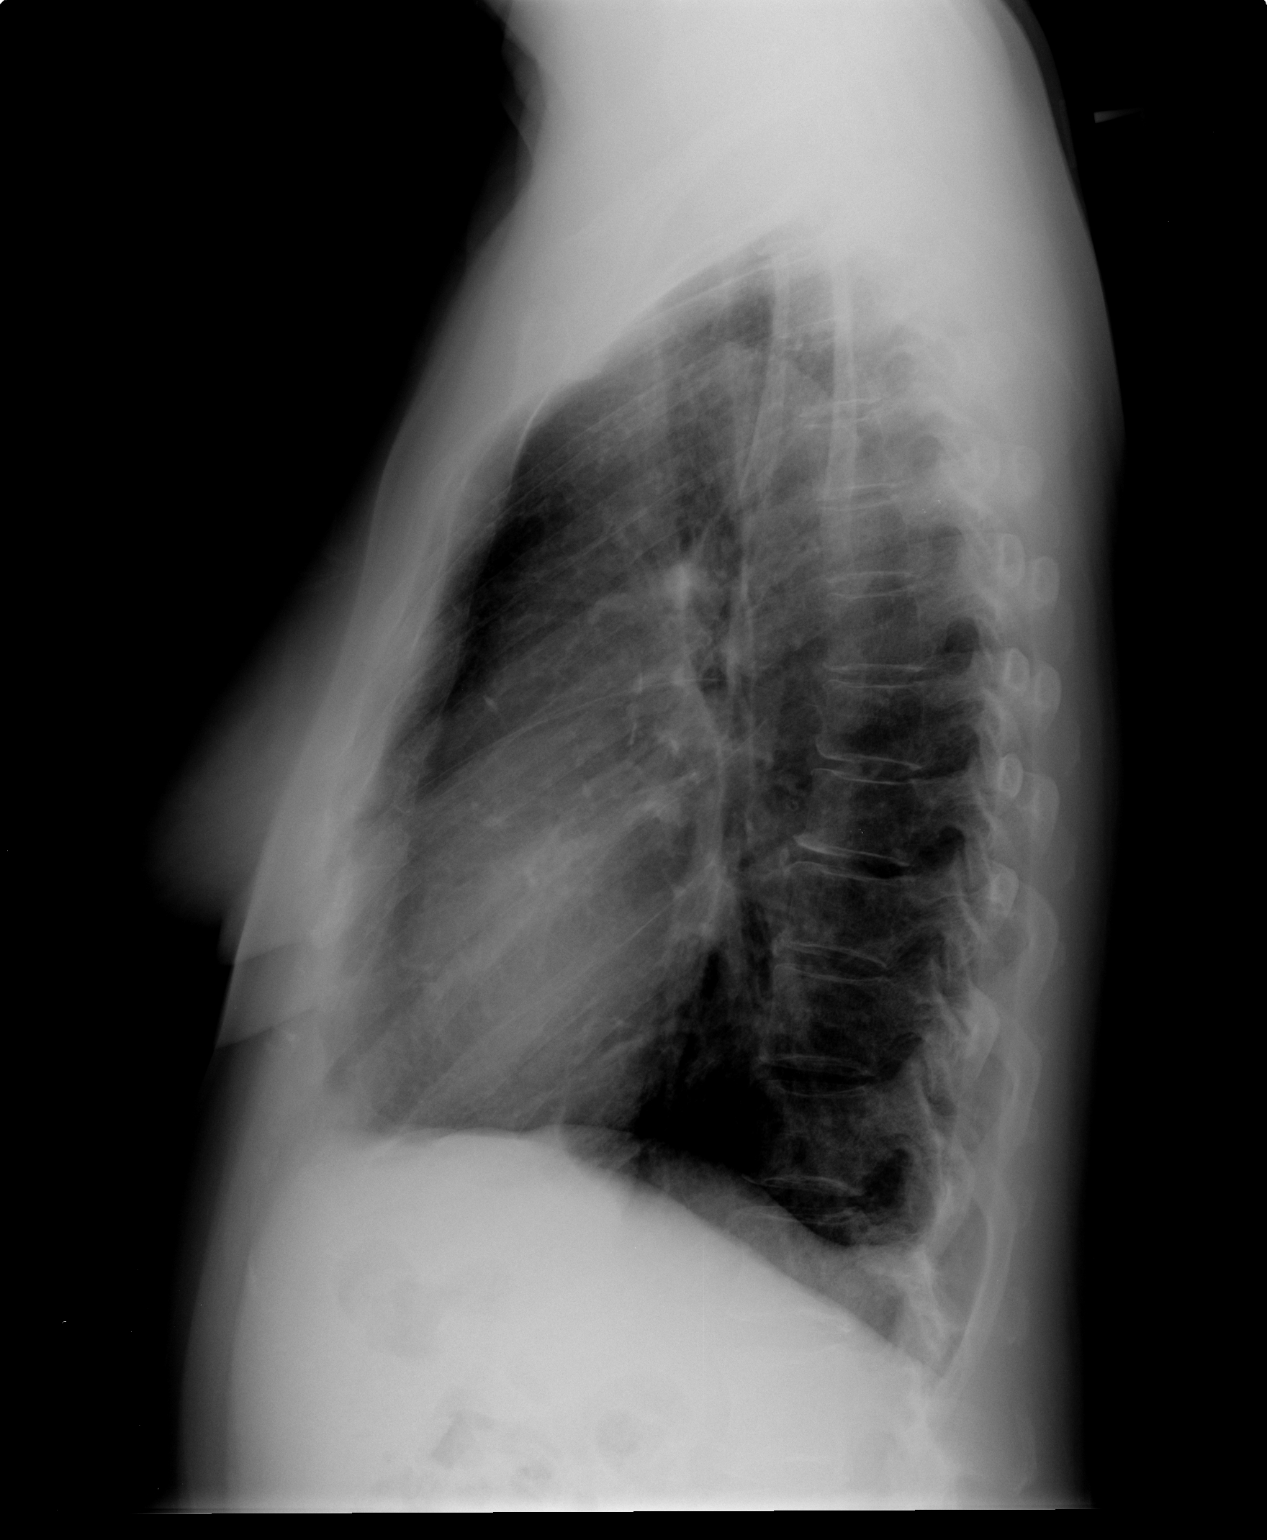

[2 of 2 positions shown; findings below may reference images not displayed]

FINDINGS: Trachea is midline.  Heart size within normal limits.
Biapical pleural parenchymal scarring.  Lungs are hyperinflated but
otherwise clear.  No pleural fluid.
IMPRESSION: No acute findings.

## 2012-11-24 IMAGING — CR DG CHEST 2V
2 series · 2 of 2 positions shown · non-contrast
Comparison: 1 day prior

CLINICAL DATA: Status post aortic valve replacement.

CHEST - 2 VIEW

[w chest pa]
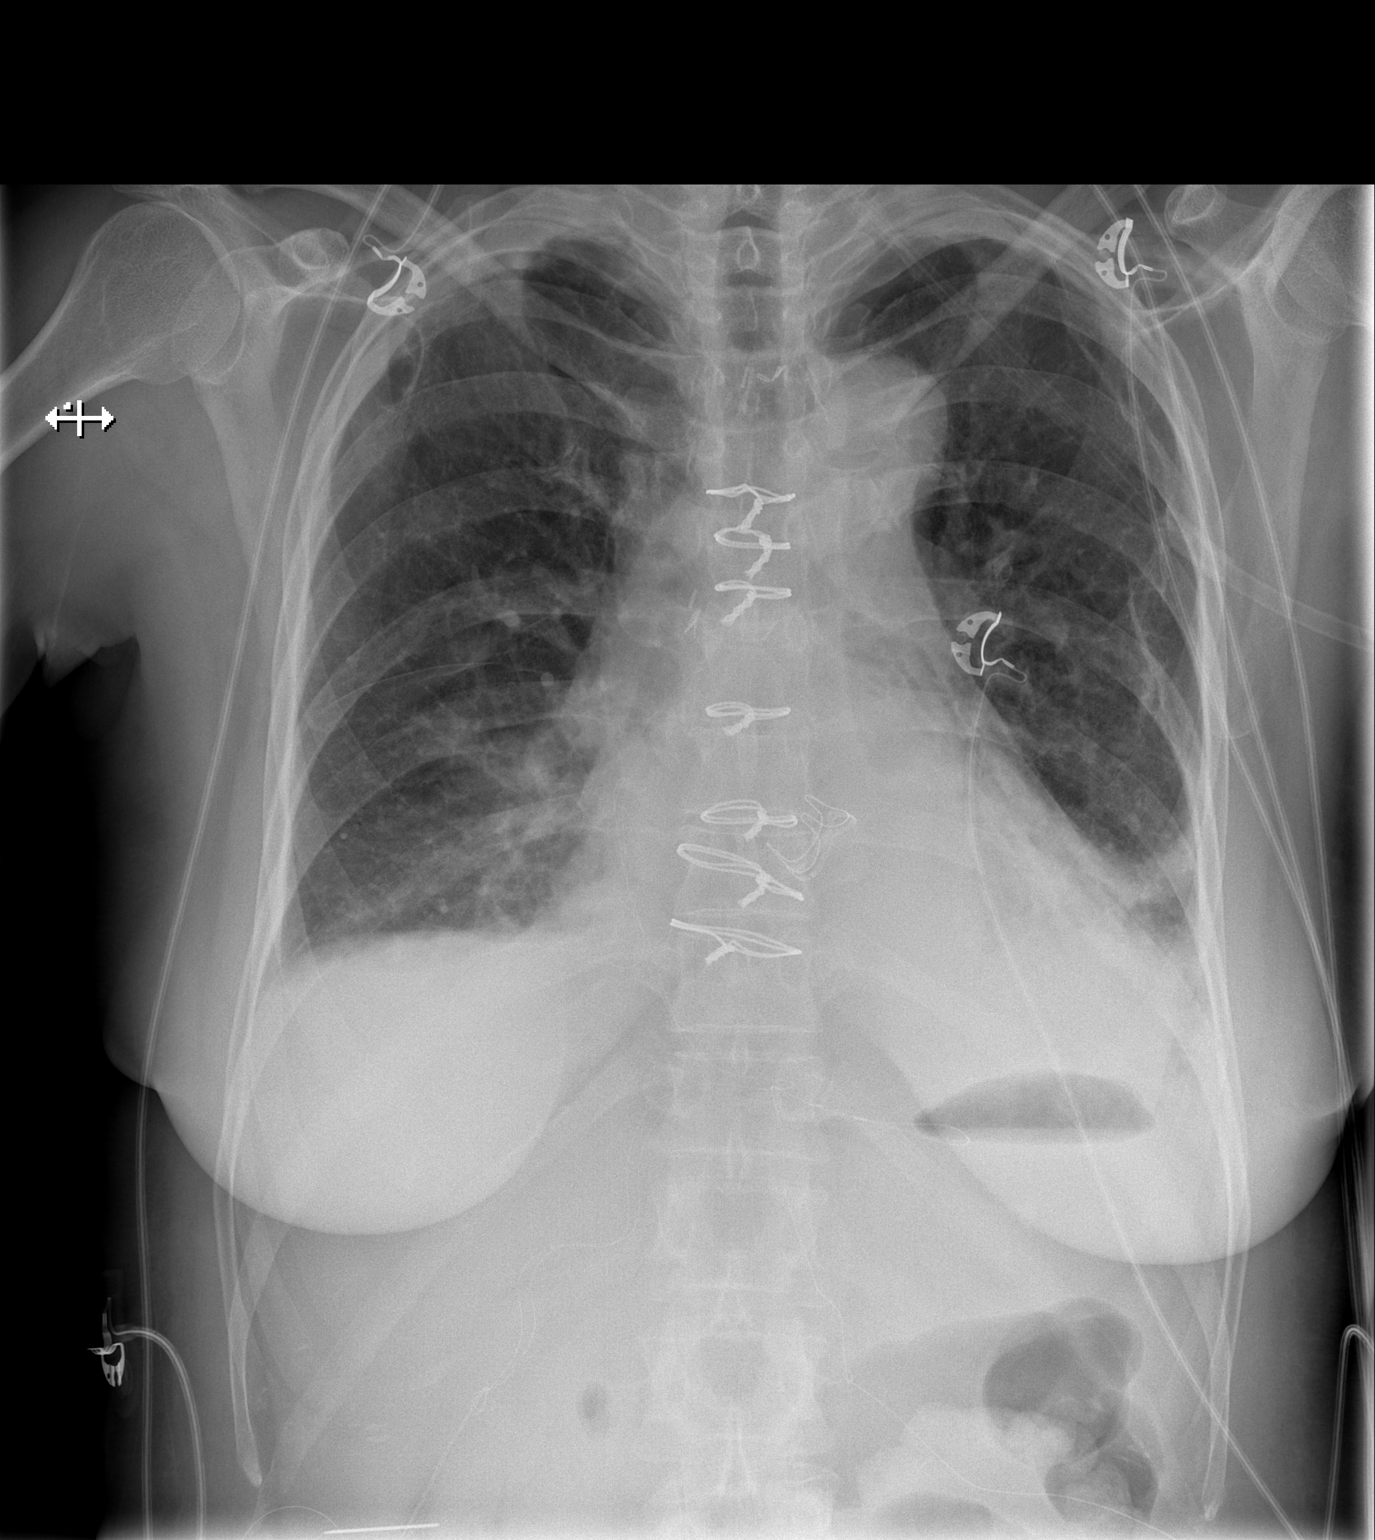

[w chest lat]
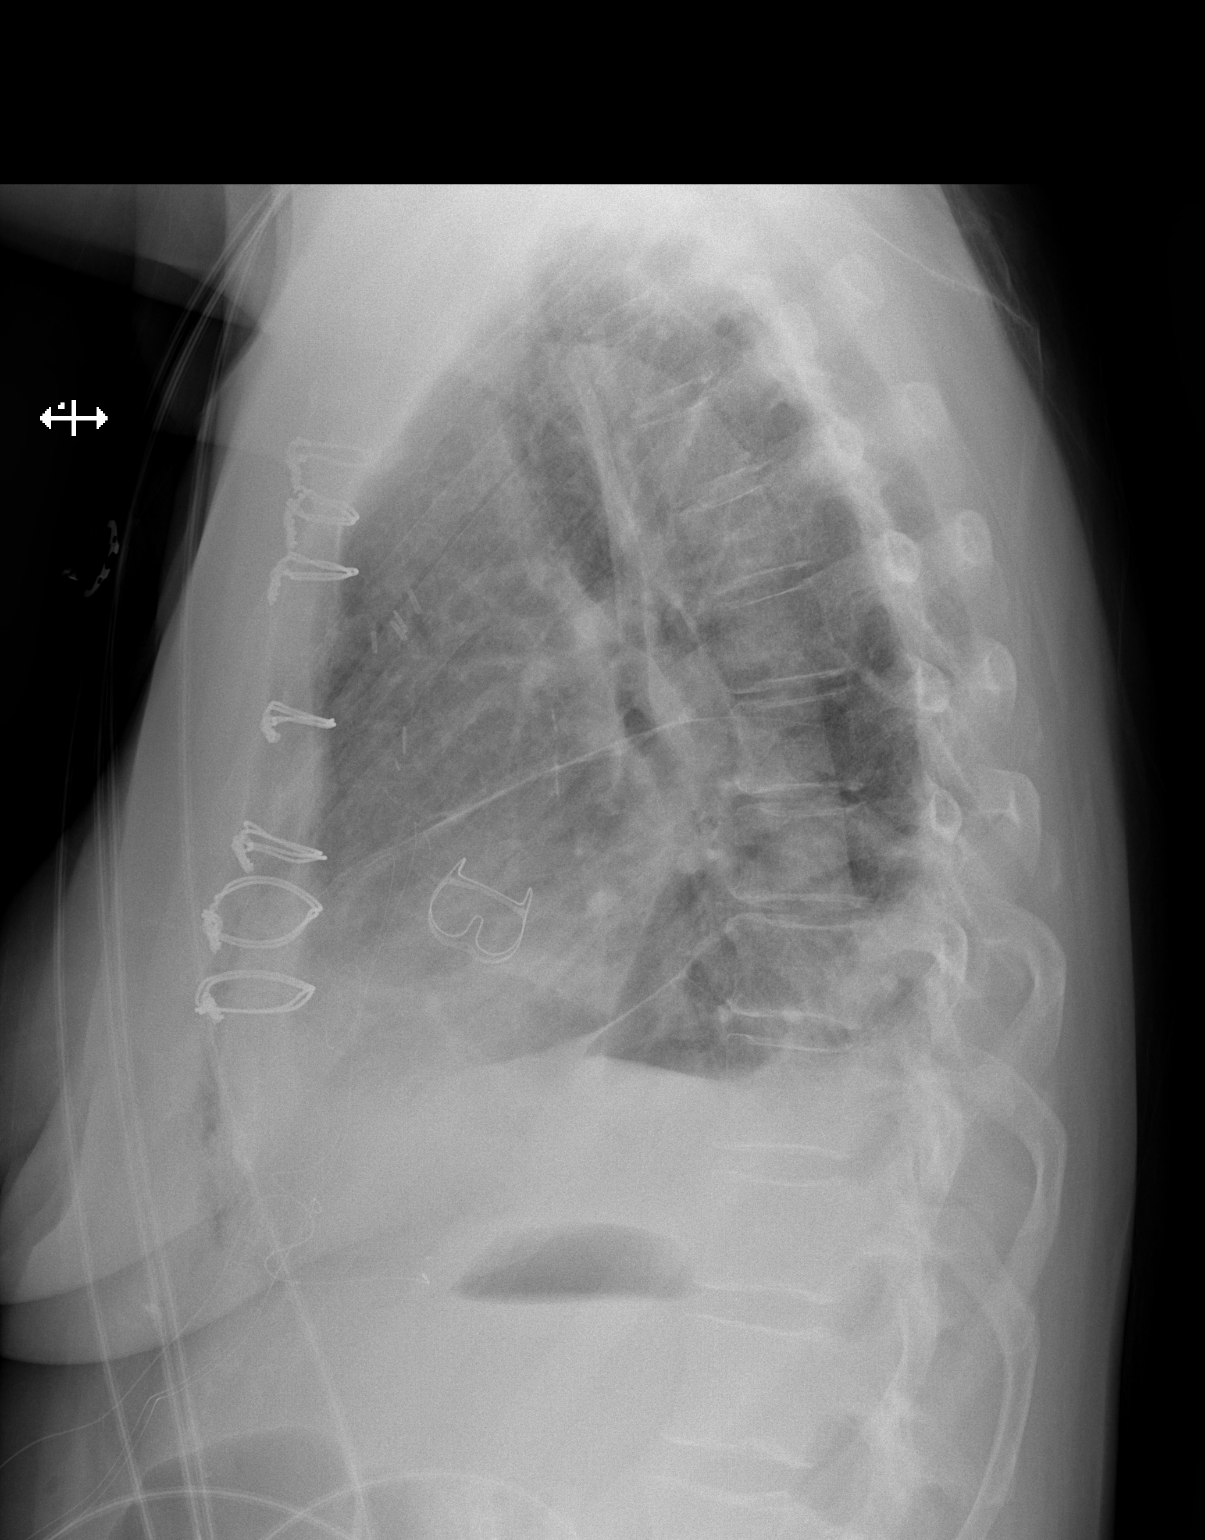

[2 of 2 positions shown; findings below may reference images not displayed]

FINDINGS: Prior median sternotomy.  Prior mitral valve repair.

Midline trachea.  Mild cardiomegaly.  Small left greater than right
pleural effusions.  The right pleural effusion is either new or
increased.  Removal of bilateral chest tubes.  Numerous leads and
wires project over the chest.  No apical pneumothorax.  There is
lucency about the lateral left hemithorax.

Resolved interstitial edema.  Persistent left greater than right
bibasilar airspace disease.
IMPRESSION: 1.  Removal of bilateral chest tubes.  Lucency along the left
lateral hemithorax is suspicious for small amount of pleural air.
2.  Resolved interstitial edema.
3.  Persistent bibasilar atelectasis with small bilateral pleural
effusions.

## 2012-12-25 IMAGING — CR DG CHEST 2V
2 series · 2 of 2 positions shown · non-contrast
Comparison: 01/28/2012.

CLINICAL DATA: Status post recent heart surgery.

CHEST - 2 VIEW

[w chest pa]
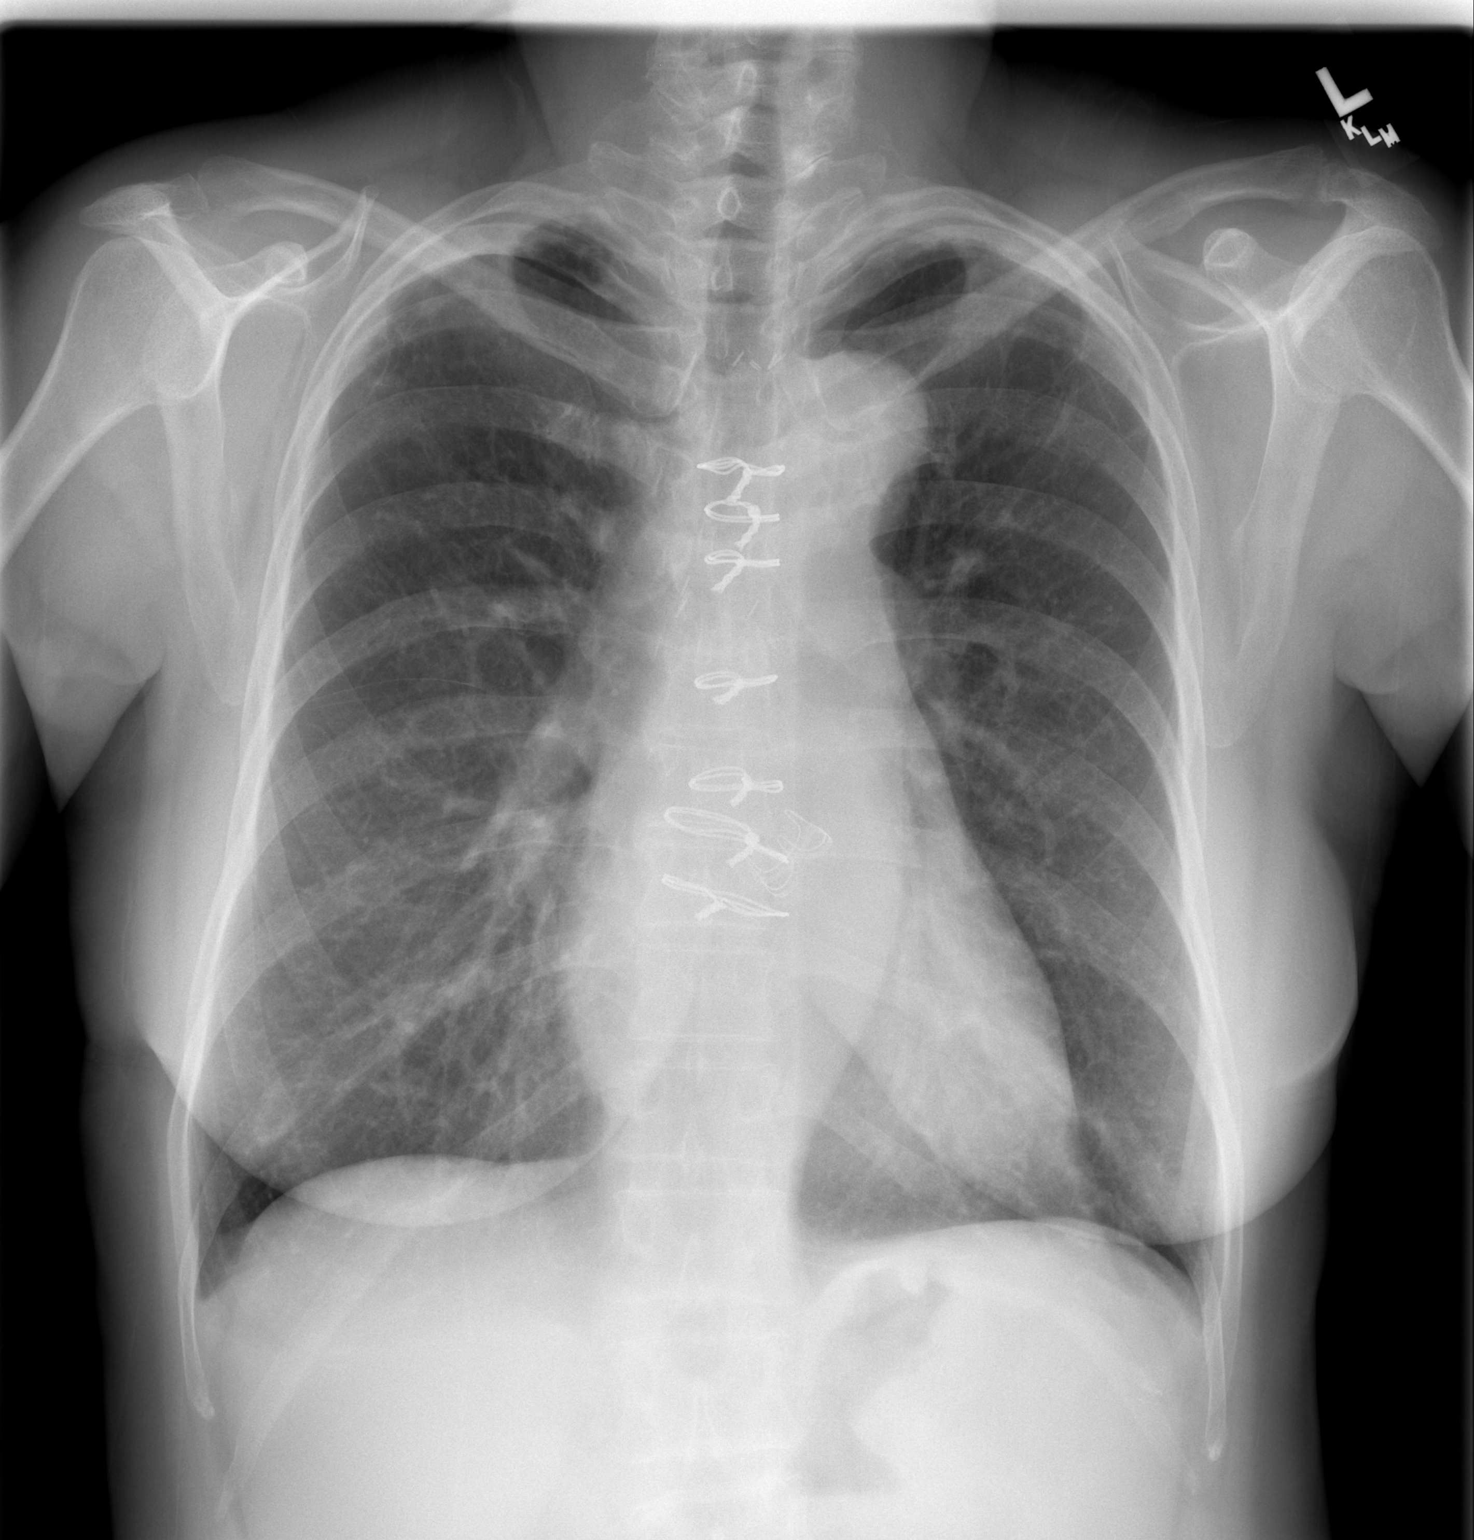

[w chest lat]
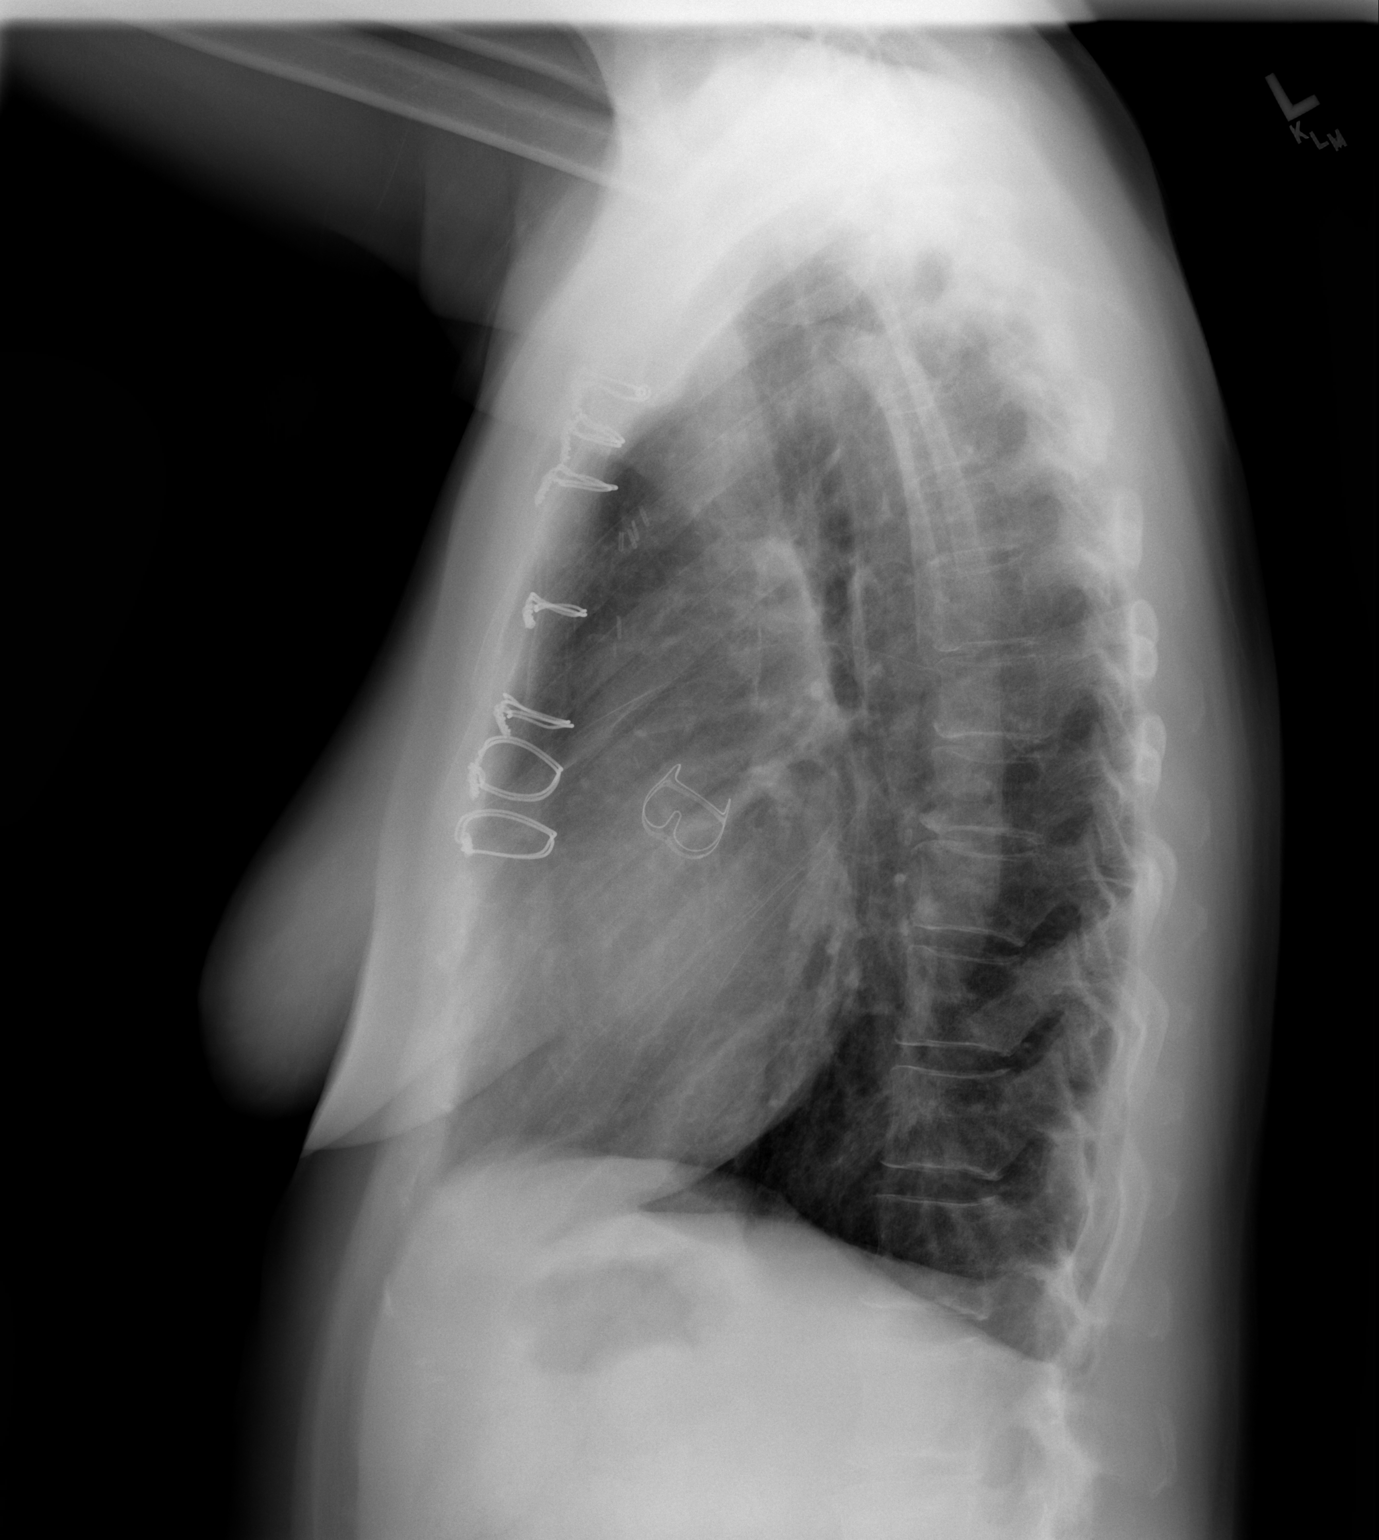

[2 of 2 positions shown; findings below may reference images not displayed]

FINDINGS: Trachea is midline.  Sternotomy wires are unchanged in
position.  Heart size normal. Biapical pleural parenchymal scarring
with mild bullous changes.  Lungs are otherwise clear.  No pleural
fluid.  No pneumothorax.
IMPRESSION: No acute findings.

## 2013-01-15 ENCOUNTER — Ambulatory Visit (INDEPENDENT_AMBULATORY_CARE_PROVIDER_SITE_OTHER): Payer: PRIVATE HEALTH INSURANCE | Admitting: Cardiology

## 2013-01-15 ENCOUNTER — Encounter: Payer: Self-pay | Admitting: Cardiology

## 2013-01-15 ENCOUNTER — Ambulatory Visit: Payer: Self-pay | Admitting: Cardiology

## 2013-01-15 VITALS — BP 145/91 | HR 65 | Ht 66.5 in | Wt 165.8 lb

## 2013-01-15 DIAGNOSIS — Z954 Presence of other heart-valve replacement: Secondary | ICD-10-CM

## 2013-01-15 DIAGNOSIS — I10 Essential (primary) hypertension: Secondary | ICD-10-CM

## 2013-01-15 NOTE — Progress Notes (Signed)
HPI  Patient is seen in followup aortic valve disease. I saw her last September, 2013. She had an aortic valve replacement. She has history of hypertension. She has a tissue valve placed in March, 2013. She's not having any chest pain or shortness of breath. There's been no syncope or presyncope. Because of her work schedule she sometimes misses her medications. Her blood pressure runs on the borderline high side. This needs to be followed up.  Allergies  Allergen Reactions  . Peanut-Containing Drug Products Other (See Comments)    headache  . Wellbutrin (Bupropion Hcl)     itching    Current Outpatient Prescriptions  Medication Sig Dispense Refill  . loratadine (CLARITIN) 10 MG tablet Take 10 mg by mouth daily as needed. For allergy      . metoprolol (LOPRESSOR) 50 MG tablet Take 50 mg by mouth 2 (two) times daily.       No current facility-administered medications for this visit.    History   Social History  . Marital Status: Single    Spouse Name: N/A    Number of Children: 2  . Years of Education: N/A   Occupational History  .  [employer]   Social History Main Topics  . Smoking status: Former Smoker -- 1.00 packs/day for 40 years    Types: Cigarettes    Quit date: 01/06/2012  . Smokeless tobacco: Never Used  . Alcohol Use: No  . Drug Use: No  . Sexually Active: Not Currently    Birth Control/ Protection: Post-menopausal   Other Topics Concern  . Not on file   Social History Narrative   She is from [place], [place]. She is widowed. Her husband committed suicide sometime last year, but she feels that she has dealt with it. She currently lives alone and works at [employer].     Family History  Problem Relation Age of Onset  . Lung cancer Father   . Uterine cancer Mother     Past Medical History  Diagnosis Date  . Recurrent upper respiratory infection (URI)   . H/O hiatal hernia   . Aortic insufficiency and aortic stenosis     AR/AS by echo/cath  3/013 Courtney Harvey) s/p tissue AVR 01/26/12 (EF 55-60%, no CAD by cath at that time)  . Chronic diastolic heart failure   . Hypertension 01/11/2012  . Tobacco abuse   . S/P aortic valve replacement 01/26/2012     12/2011,  21mm [facility] Ease pericardial tissue valve  . S/P patent foramen ovale closure 01/26/2012     12/2011,   PFO found on TEE prior to AVR. Performed at the time of aortic valve replacement  . Chest pain syndrome     Normal coronaries; EF 60%, cardiac catheterization, 3/12  . Ejection fraction     EF 55-60%, echo, January 07, 2012, EF was also normal at the time of catheterization March, 2013    Past Surgical History  Procedure Laterality Date  . Tee without cardioversion  01/10/2012    Procedure: TRANSESOPHAGEAL ECHOCARDIOGRAM (TEE);  Surgeon: Pricilla Riffle, MD;  Location: [facility] ENDOSCOPY;  Service: Cardiovascular;  Laterality: N/A;  TEE call Trish in am for time  . Cardiac catheterization  2005 and 2013    Friday March 8th       pcp Dr tapper  in [place]       . Aortic valve replacement  01/26/2012    Procedure: AORTIC VALVE REPLACEMENT (AVR);  Surgeon: Purcell Nails, MD;  Location: [facility] OR;  Service: Open Heart Surgery;  Laterality: N/A;    Patient Active Problem List  Diagnosis  . Hypertension  . Tobacco abuse  . Recurrent upper respiratory infection (URI)  . Chronic diastolic heart failure  . S/P aortic valve replacement  . S/P patent foramen ovale closure  . Chest pain syndrome  . H/O hiatal hernia  . Aortic insufficiency and aortic stenosis  . Ejection fraction    ROS   Patient denies fever, chills, headache, sweats, rash, change in vision, change in hearing, chest pain, cough, nausea vomiting, urinary symptoms. All other systems are reviewed and are negative.  PHYSICAL EXAM  Patient is oriented to person time and place. Affect is normal. Her chest is nicely healed. There is no jugulovenous distention. Lungs are clear. Respiratory effort is nonlabored. Cardiac exam  reveals S1 and S2. There no clicks or significant murmurs. The abdomen is soft. There is no peripheral edema.  Filed Vitals:   01/15/13 1344  BP: 145/91  Pulse: 65  Height: 5' 6.5" (1.689 m)  Weight: 165 lb 12.8 oz (75.206 kg)    ASSESSMENT & PLAN

## 2013-01-15 NOTE — Assessment & Plan Note (Signed)
Patient's blood pressure is slightly elevated today. She's not sure that she took her medicines. It is very important that she have followup of her blood pressure over time. She will check with her primary team next.

## 2013-01-15 NOTE — Assessment & Plan Note (Signed)
Patient had a tissue valve prosthesis placed March, 2013. She's doing very well. I'll see her back in one year.

## 2013-01-15 NOTE — Patient Instructions (Signed)
Continue all current medications. Your physician wants you to follow up in:  1 year.  You will receive a reminder letter in the mail one-two months in advance.  If you don't receive a letter, please call our office to schedule the follow up appointment   

## 2013-11-26 ENCOUNTER — Encounter: Payer: Self-pay | Admitting: Cardiology

## 2014-01-18 ENCOUNTER — Encounter: Payer: Self-pay | Admitting: Cardiology

## 2014-01-18 ENCOUNTER — Ambulatory Visit (INDEPENDENT_AMBULATORY_CARE_PROVIDER_SITE_OTHER): Payer: BC Managed Care – PPO | Admitting: Cardiology

## 2014-01-18 VITALS — BP 122/81 | HR 52 | Ht 66.0 in | Wt 179.0 lb

## 2014-01-18 DIAGNOSIS — I10 Essential (primary) hypertension: Secondary | ICD-10-CM

## 2014-01-18 DIAGNOSIS — I359 Nonrheumatic aortic valve disorder, unspecified: Secondary | ICD-10-CM

## 2014-01-18 DIAGNOSIS — I351 Nonrheumatic aortic (valve) insufficiency: Secondary | ICD-10-CM

## 2014-01-18 DIAGNOSIS — R079 Chest pain, unspecified: Secondary | ICD-10-CM

## 2014-01-18 NOTE — Progress Notes (Signed)
HPI  Patient is seen today for followup aortic valve disease. I saw her last March, 2014. She received a new tissue aortic valve in March 2 013. With careful review I noted that she has not had post operative 2-D echo. This will be arranged. Recently she had a diuretic added to her medications for increased blood pressure. She explained to me that she took it for a few days and then stopped. She was waiting for my additional opinion today.  Allergies  Allergen Reactions  . Peanut-Containing Drug Products Other (See Comments)    headache  . Wellbutrin [Bupropion Hcl]     itching    Current Outpatient Prescriptions  Medication Sig Dispense Refill  . cetirizine (ZYRTEC) 10 MG tablet Take 10 mg by mouth daily.      . metoprolol (LOPRESSOR) 50 MG tablet Take 50 mg by mouth 2 (two) times daily.       No current facility-administered medications for this visit.    History   Social History  . Marital Status: Single    Spouse Name: N/A    Number of Children: 2  . Years of Education: N/A   Occupational History  .  [employer]   Social History Main Topics  . Smoking status: Former Smoker -- 1.00 packs/day for 40 years    Types: Cigarettes    Quit date: 01/06/2012  . Smokeless tobacco: Never Used  . Alcohol Use: No  . Drug Use: No  . Sexual Activity: Not Currently    Birth Control/ Protection: Post-menopausal   Other Topics Concern  . Not on file   Social History Narrative   She is from [place], [place]. She is widowed. Her husband committed suicide sometime last year, but she feels that she has dealt with it. She currently lives alone and works at [employer].     Family History  Problem Relation Age of Onset  . Lung cancer Father   . Uterine cancer Mother     Past Medical History  Diagnosis Date  . Recurrent upper respiratory infection (URI)   . H/O hiatal hernia   . Aortic insufficiency and aortic stenosis     AR/AS by echo/cath 3/013 [name]) s/p tissue  AVR 01/26/12 (EF 55-60%, no CAD by cath at that time)  . Chronic diastolic heart failure   . Hypertension 01/11/2012  . Tobacco abuse   . S/P aortic valve replacement 01/26/2012     12/2011,  21mm [facility] Ease pericardial tissue valve  . S/P patent foramen ovale closure 01/26/2012     12/2011,   PFO found on TEE prior to AVR. Performed at the time of aortic valve replacement  . Chest pain syndrome     Normal coronaries; EF 60%, cardiac catheterization, 3/12  . Ejection fraction     EF 55-60%, echo, January 07, 2012, EF was also normal at the time of catheterization March, 2013    Past Surgical History  Procedure Laterality Date  . Tee without cardioversion  01/10/2012    Procedure: TRANSESOPHAGEAL ECHOCARDIOGRAM (TEE);  Surgeon: Pricilla Riffle, MD;  Location: [facility] ENDOSCOPY;  Service: Cardiovascular;  Laterality: N/A;  TEE call Trish in am for time  . Cardiac catheterization  2005 and 2013    Friday March 8th       pcp Dr tapper  in [place]       . Aortic valve replacement  01/26/2012    Procedure: AORTIC VALVE REPLACEMENT (AVR);  Surgeon: Purcell Nails,  MD;  Location: [facility] OR;  Service: Open Heart Surgery;  Laterality: N/A;    Patient Active Problem List   Diagnosis Date Noted  . H/O hiatal hernia   . Aortic insufficiency and aortic stenosis   . Ejection fraction   . Recurrent upper respiratory infection (URI)   . Chronic diastolic heart failure   . Chest pain syndrome   . S/P aortic valve replacement 01/26/2012  . S/P patent foramen ovale closure 01/26/2012  . Hypertension 01/11/2012  . Tobacco abuse 01/11/2012    ROS   Patient denies fever, chills, headache, sweats, rash, change in vision, change in hearing, chest pain, cough, nausea vomiting, urinary symptoms. All other systems are reviewed and are negative.  PHYSICAL EXAM  Patient is oriented to person time and place. Affect is normal. There is no jugulovenous distention. Lungs are clear. Respiratory effort is nonlabored. Cardiac  exam reveals S1 and S2. There is a soft systolic outflow murmur. The abdomen is soft. There is no peripheral edema.  Filed Vitals:   01/18/14 0956  BP: 122/81  Pulse: 52  Height: 5\' 6"  (1.676 m)  Weight: 179 lb (81.194 kg)   EKG is done today and reviewed by me. There is mild sinus bradycardia. There are nonspecific ST-T wave changes. ASSESSMENT & PLAN

## 2014-01-18 NOTE — Assessment & Plan Note (Signed)
The patient received a tissue aortic valve replacement March, 2013. This is working well clinically. It is time to do a postoperative 2-D echo to reassess her ventricle and her valve.

## 2014-01-18 NOTE — Patient Instructions (Signed)

## 2014-01-18 NOTE — Assessment & Plan Note (Signed)
Recently she had some increased blood pressure. She took her diuretic for a while and then has not taken it for several days. I have suggested that she continue the diuretic and consider decreasing the dose of her metoprolol to 25 mg twice a day. Her resting heart rate is on the slow side. She will review this approach with Dr. Margo Commonapper today to make a final decision.

## 2014-01-31 ENCOUNTER — Other Ambulatory Visit: Payer: Self-pay

## 2014-02-07 ENCOUNTER — Other Ambulatory Visit (INDEPENDENT_AMBULATORY_CARE_PROVIDER_SITE_OTHER): Payer: BC Managed Care – PPO

## 2014-02-07 ENCOUNTER — Other Ambulatory Visit: Payer: Self-pay

## 2014-02-07 DIAGNOSIS — I351 Nonrheumatic aortic (valve) insufficiency: Secondary | ICD-10-CM

## 2014-02-07 DIAGNOSIS — R079 Chest pain, unspecified: Secondary | ICD-10-CM

## 2014-02-07 DIAGNOSIS — I359 Nonrheumatic aortic valve disorder, unspecified: Secondary | ICD-10-CM

## 2014-02-08 ENCOUNTER — Encounter: Payer: Self-pay | Admitting: Cardiology

## 2014-02-13 ENCOUNTER — Telehealth: Payer: Self-pay | Admitting: *Deleted

## 2014-02-13 NOTE — Telephone Encounter (Signed)
Message copied by Eustace MooreANDERSON, Yared Barefoot M on Wed Feb 13, 2014 10:38 AM ------      Message from: Myrtis SerKATZ, [place] D      Created: Fri Feb 08, 2014  7:49 AM       Please let the patient know that her echo result is great. Muscle function of her heart is very good. Her aortic valve prosthesis is working very well. ------

## 2014-02-21 NOTE — Telephone Encounter (Signed)
Patient informed. 

## 2014-10-10 ENCOUNTER — Encounter (HOSPITAL_COMMUNITY): Payer: Self-pay | Admitting: Cardiovascular Disease

## 2015-01-31 ENCOUNTER — Ambulatory Visit (INDEPENDENT_AMBULATORY_CARE_PROVIDER_SITE_OTHER): Payer: Self-pay | Admitting: Cardiology

## 2015-01-31 ENCOUNTER — Encounter: Payer: Self-pay | Admitting: Cardiology

## 2015-01-31 VITALS — BP 111/78 | HR 81 | Ht 66.0 in | Wt 181.4 lb

## 2015-01-31 DIAGNOSIS — Z952 Presence of prosthetic heart valve: Secondary | ICD-10-CM

## 2015-01-31 DIAGNOSIS — I10 Essential (primary) hypertension: Secondary | ICD-10-CM

## 2015-01-31 DIAGNOSIS — Z9889 Other specified postprocedural states: Secondary | ICD-10-CM

## 2015-01-31 DIAGNOSIS — Z954 Presence of other heart-valve replacement: Secondary | ICD-10-CM

## 2015-01-31 DIAGNOSIS — Z8774 Personal history of (corrected) congenital malformations of heart and circulatory system: Secondary | ICD-10-CM

## 2015-01-31 NOTE — Patient Instructions (Signed)
Your physician recommends that you schedule a follow-up appointment in: 1 year with Dr. McDowell. You will receive a reminder letter in the mail in about 10 months reminding you to call and schedule your appointment. If you don't receive this letter, please contact our office. Your physician recommends that you continue on your current medications as directed. Please refer to the Current Medication list given to you today. 

## 2015-01-31 NOTE — Assessment & Plan Note (Signed)
Patient is doing very well after her aortic valve surgery in March, 2013. Her echo in April, 2015 showed good LV function and good valvular function. There was slight increase in the gradient that was related to vigorous LV function. No further workup is needed.

## 2015-01-31 NOTE — Assessment & Plan Note (Signed)
Blood pressures controlled. No change in therapy.  The patient is aware that I will retire at the end of September, 2016. She has requested that her follow up with our group be with Dr. Domenic Polite who she has met in the past.

## 2015-01-31 NOTE — Progress Notes (Signed)
Cardiology Office Note   Date:  01/31/2015   ID:  FARHA DANO, DOB 25-Jul-1956, MRN [id number]  PCP:  Louie Boston, MD  Cardiologist:  Willa Rough, MD   Chief Complaint  Patient presents with  . Appointment    Follow-up aortic valve disease      History of Present Illness: Courtney Harvey is a 59 y.o. female who presents today to follow-up aortic valve disease. She had surgery with a new tissue aortic valve in March, 2013. After I saw her last year in March, 2015, a follow-up 2-D echo was done. It shows excellent LV function and good aortic prosthetic function. She is doing well.    Past Medical History  Diagnosis Date  . Recurrent upper respiratory infection (URI)   . H/O hiatal hernia   . Aortic insufficiency and aortic stenosis     AR/AS by echo/cath 3/013 [name]) s/p tissue AVR 01/26/12 (EF 55-60%, no CAD by cath at that time)  . Chronic diastolic heart failure   . Hypertension 01/11/2012  . Tobacco abuse   . S/P aortic valve replacement 01/26/2012     12/2011,  21mm [facility] Ease pericardial tissue valve  . S/P patent foramen ovale closure 01/26/2012     12/2011,   PFO found on TEE prior to AVR. Performed at the time of aortic valve replacement  . Chest pain syndrome     Normal coronaries; EF 60%, cardiac catheterization, 3/12  . Ejection fraction     EF 55-60%, echo, January 07, 2012, EF was also normal at the time of catheterization March, 2013    Past Surgical History  Procedure Laterality Date  . Tee without cardioversion  01/10/2012    Procedure: TRANSESOPHAGEAL ECHOCARDIOGRAM (TEE);  Surgeon: Pricilla Riffle, MD;  Location[facility] ENDOSCOPY;  Service: Cardiovascular;  Laterality: N/A;  TEE call Trish in am for time  . Cardiac catheterization  2005 and 2013    Friday March 8th       pcp Dr tapper  in [place]       . Aortic valve replacement  01/26/2012    Procedure: AORTIC VALVE REPLACEMENT (AVR);  Surgeon: Purcell Nails, MD;  Location: [facility] OR;  Service: Open  Heart Surgery;  Laterality: N/A;  . Left and right heart catheterization with coronary angiogram N/A 01/07/2012    Procedure: LEFT AND RIGHT HEART CATHETERIZATION WITH CORONARY ANGIOGRAM;  Surgeon: Wendall Stade, MD;  Location: [facility] CATH LAB;  Service: Cardiovascular;  Laterality: N/A;    Patient Active Problem List   Diagnosis Date Noted  . H/O hiatal hernia   . Aortic insufficiency with aortic stenosis   . Ejection fraction   . Recurrent upper respiratory infection (URI)   . Chronic diastolic heart failure   . Chest pain syndrome   . S/P aortic valve replacement 01/26/2012  . S/P patent foramen ovale closure 01/26/2012  . Hypertension 01/11/2012  . Tobacco abuse 01/11/2012      Current Outpatient Prescriptions  Medication Sig Dispense Refill  . cetirizine (ZYRTEC) 10 MG tablet Take 10 mg by mouth daily.    Marland Kitchen triamterene-hydrochlorothiazide (DYAZIDE) 37.5-25 MG per capsule Take 0.5 capsules by mouth every morning.     No current facility-administered medications for this visit.    Allergies:   Peanut-containing drug products and Wellbutrin    Social History:  The patient  reports that she quit smoking about 3 years ago. Her smoking use included Cigarettes. She has a 40 pack-year smoking history.  She has never used smokeless tobacco. She reports that she does not drink alcohol or use illicit drugs.   Family History:  The patient's family history includes Lung cancer in her father; Uterine cancer in her mother.    ROS:  Please see the history of present illness.  Patient denies fever, chills, headache, sweats, rash, change in vision, change in hearing, chest pain, cough, nausea or vomiting, urinary symptoms. All other systems are reviewed and are negative.      PHYSICAL EXAM: VS:  BP 111/78 mmHg  Pulse 81  Ht 5\' 6"  (1.676 m)  Wt 181 lb 6.4 oz (82.283 kg)  BMI 29.29 kg/m2  SpO2 96% , Patient is oriented to person time and place. Affect is normal. Head is atraumatic. Sclera  and conjunctiva are normal. There is no jugular venous distention. Lungs are clear. Respiratory effort is nonlabored. Cardiac exam reveals S1 and S2. There is no obvious aortic insufficiency heard. The abdomen is soft. There is no peripheral edema. There are no musculoskeletal deformities. There are no skin rashes. Her chest wall is very nicely healed from her prior surgery. Neurologic is grossly intact.  EKG:   EKG is done today and reviewed by me. The EKG is normal.   Recent Labs: No results found for requested labs within last 365 days.    Lipid Panel No results found for: CHOL, TRIG, HDL, CHOLHDL, VLDL, LDLCALC, LDLDIRECT    Wt Readings from Last 3 Encounters:  01/31/15 181 lb 6.4 oz (82.283 kg)  01/18/14 179 lb (81.194 kg)  01/15/13 165 lb 12.8 oz (75.206 kg)      Current medicines are reviewed  The patient understands her medications.   ASSESSMENT AND PLAN:

## 2016-04-07 ENCOUNTER — Ambulatory Visit (INDEPENDENT_AMBULATORY_CARE_PROVIDER_SITE_OTHER): Payer: BLUE CROSS/BLUE SHIELD | Admitting: Cardiology

## 2016-04-07 ENCOUNTER — Encounter: Payer: Self-pay | Admitting: Cardiology

## 2016-04-07 VITALS — BP 118/86 | HR 81 | Ht 66.0 in | Wt 183.2 lb

## 2016-04-07 DIAGNOSIS — I10 Essential (primary) hypertension: Secondary | ICD-10-CM

## 2016-04-07 DIAGNOSIS — Z8774 Personal history of (corrected) congenital malformations of heart and circulatory system: Secondary | ICD-10-CM | POA: Diagnosis not present

## 2016-04-07 DIAGNOSIS — Z952 Presence of prosthetic heart valve: Secondary | ICD-10-CM

## 2016-04-07 DIAGNOSIS — Z9889 Other specified postprocedural states: Secondary | ICD-10-CM | POA: Diagnosis not present

## 2016-04-07 DIAGNOSIS — Z954 Presence of other heart-valve replacement: Secondary | ICD-10-CM | POA: Diagnosis not present

## 2016-04-07 NOTE — Progress Notes (Signed)
Cardiology Office Note  Date: 04/07/2016   ID: Courtney Siaseggy D Kobashigawa, DOB [birth year], MRN [id number]  PCP: Louie BostonAPPER,DAVID B, MD  Primary Cardiologist: Nona DellSamuel Hether Anselmo, MD   Chief Complaint  Patient presents with  . Status post AVR    History of Present Illness: Courtney Harvey is a 60 y.o. female former patient of Dr. Myrtis SerKatz, last seen in April 2016. She is now establishing follow-up with me in clinic. I reviewed her records and updated her chart.She states that she has been doing very well, no angina symptoms or shortness of breath with activities. She takes care of several grandchildren, now retired.  I reviewed her medications which are outlined below. She has had no other major changes in health over the last year. ECG today shows sinus rhythm with left atrial enlargement and nonspecific ST-T changes.  She has had an echocardiogram within the last 2 years, report outlined below. Does not report any fevers or chills.  She follows with Dr. Margo Commonapper for primary care.  Past Medical History  Diagnosis Date  . Recurrent upper respiratory infection (URI)   . H/O hiatal hernia   . Aortic insufficiency and aortic stenosis     AR/AS by echo/cath 3/013 [name]([place]) s/p tissue AVR 01/26/12 (EF 55-60%, no CAD by cath at that time)  . Chronic diastolic heart failure (HCC)   . Essential hypertension   . S/P aortic valve replacement 01/26/2012    [facility]mm [facility] Ease pericardial tissue valve  . S/P patent foramen ovale closure 01/26/2012    PFO found on TEE prior to AVR. Performed at the time of aortic valve replacement  . History of cardiac catheterization     Normal coronaries, March 2012    Past Surgical History  Procedure Laterality Date  . Tee without cardioversion  01/10/2012    Procedure: TRANSESOPHAGEAL ECHOCARDIOGRAM (TEE);  Surgeon: Pricilla RifflePaula V Ross, MD;  [facility];  Service: Cardiovascular;  Laterality: N/A;  TEE call Trish in am for time  . Cardiac catheterization  2005 and  2013    Friday March 8th       pcp Dr tapper  in [place]       . Aortic valve replacement  01/26/2012    Procedure: AORTIC VALVE REPLACEMENT (AVR);  Surgeon: Purcell Nailslarence H Owen, MD;  [facility];  Service: Open Heart Surgery;  Laterality: N/A;  . Left and right heart catheterization with coronary angiogram N/A 01/07/2012    Procedure: LEFT AND RIGHT HEART CATHETERIZATION WITH CORONARY ANGIOGRAM;  Surgeon: Wendall StadePeter C Nishan, MD;  [facility];  Service: Cardiovascular;  Laterality: N/A;    Current Outpatient Prescriptions  Medication Sig Dispense Refill  . cetirizine (ZYRTEC) 10 MG tablet Take 10 mg by mouth daily.    Marland Kitchen. triamterene-hydrochlorothiazide (DYAZIDE) 37.5-25 MG per capsule Take 1 capsule by mouth every morning.      No current facility-administered medications for this visit.   Allergies:  Peanut-containing drug products and Wellbutrin   Social History: The patient  reports that she quit smoking about 4 years ago. Her smoking use included Cigarettes. She has a 40 pack-year smoking history. She has never used smokeless tobacco. She reports that she does not drink alcohol or use illicit drugs.   Family History: The patient's family history includes Lung cancer in her father; Uterine cancer in her mother.   ROS:  Please see the history of present illness. Otherwise, complete review of systems is positive for none.  All other systems are  reviewed and negative.   Physical Exam: VS:  BP 118/86 mmHg  Pulse 81  Ht  (1.676 m)  Wt 183 lb 3.2 oz (83.099 kg)  BMI 29.58 kg/m2  SpO2 98%, BMI Body mass index is 29.58 kg/(m^2).  Wt Readings from Last 3 Encounters:  04/07/16 183 lb 3.2 oz (83.099 kg)  01/31/15 181 lb 6.4 oz (82.283 kg)  01/18/14 179 lb (81.194 kg)    General: Patient appears comfortable at rest. HEENT: Conjunctiva and lids normal, oropharynx clear. Neck: Supple, no elevated JVP or carotid bruits, no thyromegaly. Lungs: Clear to auscultation, nonlabored breathing  at rest. Cardiac: Regular rate and rhythm, no S3, 2/6 systolic murmur, no pericardial rub. Abdomen: Soft, nontender, bowel sounds present, no guarding or rebound. Extremities: No pitting edema, distal pulses 2+. Skin: Warm and dry. Musculoskeletal: No kyphosis. Neuropsychiatric: Alert and oriented x3, affect grossly appropriate.  ECG: I reviewed the prior tracing from 01/31/2015 which showed normal sinus rhythm.  Recent Labwork:  January 2015: BUN 16, creatinine 0.78, AST 24, ALT 28, cholesterol 193, triglycerides 197, HDL 39, LDL 115, potassium 4.7, TSH 2.3, hemoglobin 14.0, platelets 191  Other Studies Reviewed Today:  Echocardiogram 02/07/2014: Study Conclusions  - Left ventricle: The cavity size was normal. Wall thickness was increased in a pattern of mild LVH. Systolic function was vigorous. The estimated ejection fraction was in the range of 65% to 70%. Wall motion was normal; there were no regional wall motion abnormalities. Doppler parameters are consistent with abnormal left ventricular relaxation (grade 1 diastolic dysfunction). - Aortic valve: A 21 mm [facility] Ease bioprosthesis was present. No significant regurgitation or paravalvular leak, Mean gradient: 16mm Hg (S). VTI ratio of LVOT to aortic valve: 0.58. Peak velocity somewhat higher than expected, but suspect related to increased cardiac output. Mean gradient and LVOT/AV ratio are consistent with normal prosthetic function. - Mitral valve: Trivial regurgitation. - Right ventricle: The cavity size was mildly dilated. Systolic function was normal. - Right atrium: Central venous pressure: 3mm Hg (est). - Atrial septum: No defect or patent foramen ovale was identified based on limited views. - Tricuspid valve: Trivial regurgitation. - Pulmonary arteries: PA peak pressure: 23mm Hg (S). - Pericardium, extracardiac: There was no pericardial effusion. Impressions:  - Mild LVH with  normal LV chamber size, LVEF 65-70%. Grade 1 diastolic dysfunction. Normally functioningbioprosthetic AVR as detailed above. Mild right ventricular enlargement with normal contraction.Normal PASP 23 mm mercury. No pericardial effusion.  Assessment and Plan:  1. History of aortic stenosis and regurgitation status post pericardial AVR in 2013 with concurrent closure of PFO. She had normal coronary arteries documented prior to this. Follow-up echocardiogram is reviewed above. She is doing well symptomatically, will continue observation at this point. SBE prophylaxis indicated with dental work.  2. Essential hypertension, blood pressure is well controlled today.  Current medicines were reviewed with the patient today.   Orders Placed This Encounter  Procedures  . EKG 12-Lead    Disposition: FU with me in 1 year.   Signed, Jonelle Sidle, MD, [facility] 04/07/2016 3:11 PM    [facility] [address] [place], [place], [place] [address] Phone: [phone]; Fax: [phone]

## 2016-04-07 NOTE — Patient Instructions (Signed)

## 2017-04-08 ENCOUNTER — Encounter: Payer: Self-pay | Admitting: *Deleted

## 2017-04-10 NOTE — Progress Notes (Signed)
Cardiology Office Note  Date: 04/11/2017   ID: Courtney Siaseggy D Broadfoot, DOB [birth year], MRN [id number]  PCP: Louie Bostonapper, David B., MD  Primary Cardiologist: Nona DellSamuel Truth Barot, MD   Chief Complaint  Patient presents with  . History of AVR    History of Present Illness: Courtney Harvey is a 61 y.o. female last seen in June 2017. She presents for a routine follow-up visit. Reports no major change in stamina, no exertional chest pain, NYHA class II dyspnea. She has been doing some exercise on a Nordic track at home, trying to lose some weight.  I reviewed her medications which are outlined below. She continues to follow with Dr. Margo Commonapper, states that she had interval lab work which will request for review.  Last echocardiogram from 2015 is noted below.  I personally reviewed her ECG today which shows normal sinus rhythm with small R' in lead V1 and V2.  Past Medical History:  Diagnosis Date  . Aortic insufficiency and aortic stenosis    AR/AS by echo/cath 3/013 [name]([place]) s/p tissue AVR 01/26/12 (EF 55-60%, no CAD by cath at that time)  . Chronic diastolic heart failure (HCC)   . Essential hypertension   . H/O hiatal hernia   . History of cardiac catheterization    Normal coronaries, March 2012  . Recurrent upper respiratory infection (URI)   . S/P aortic valve replacement 01/26/2012   [facility] pericardial tissue valve  . S/P patent foramen ovale closure 01/26/2012   PFO found on TEE prior to AVR. Performed at the time of aortic valve replacement    Past Surgical History:  Procedure Laterality Date  . AORTIC VALVE REPLACEMENT  01/26/2012   Procedure: AORTIC VALVE REPLACEMENT (AVR);  Surgeon: Purcell Nailslarence H Owen, MD;  [facility];  Service: Open Heart Surgery;  Laterality: N/A;  . CARDIAC CATHETERIZATION  2005 and 2013   Friday March 8th       [place]       . LEFT AND RIGHT HEART CATHETERIZATION WITH CORONARY ANGIOGRAM N/A 01/07/2012   Procedure: LEFT AND RIGHT  HEART CATHETERIZATION WITH CORONARY ANGIOGRAM;  Surgeon: Wendall StadePeter C Nishan, MD;  [facility];  Service: Cardiovascular;  Laterality: N/A;  . TEE WITHOUT CARDIOVERSION  01/10/2012   Procedure: TRANSESOPHAGEAL ECHOCARDIOGRAM (TEE);  Surgeon: Pricilla RifflePaula V Ross, MD;  [facility];  Service: Cardiovascular;  Laterality: N/A;  TEE call Trish in am for time    Current Outpatient Prescriptions  Medication Sig Dispense Refill  . cetirizine (ZYRTEC) 10 MG tablet Take 10 mg by mouth daily.    Marland Kitchen. triamterene-hydrochlorothiazide (DYAZIDE) 37.5-25 MG per capsule Take 1 capsule by mouth every morning.      No current facility-administered medications for this visit.    Allergies:  Peanut-containing drug products and Wellbutrin [bupropion hcl]   Social History: The patient  reports that she quit smoking about 5 years ago. Her smoking use included Cigarettes. She has a 40.00 pack-year smoking history. She has never used smokeless tobacco. She reports that she does not drink alcohol or use drugs.   ROS:  Please see the history of present illness. Otherwise, complete review of systems is positive for none.  All other systems are reviewed and negative.   Physical Exam: VS:  BP 110/80   Pulse 75   Ht 5' 6.5" (1.689 m)   Wt 181 lb 9.6 oz (82.4 kg)   LMP  (LMP Unknown)   BMI 28.87 kg/m ,  BMI Body mass index is 28.87 kg/m.  Wt Readings from Last 3 Encounters:  04/11/17 181 lb 9.6 oz (82.4 kg)  04/07/16 183 lb 3.2 oz (83.1 kg)  01/31/15 181 lb 6.4 oz (82.3 kg)    General: Patient appears comfortable at rest. HEENT: Conjunctiva and lids normal, oropharynx clear. Neck: Supple, no elevated JVP or carotid bruits, no thyromegaly. Lungs: Clear to auscultation, nonlabored breathing at rest. Cardiac: Regular rate and rhythm, no S3, 2/6 systolic murmur, no pericardial rub. Abdomen: Soft, nontender, bowel sounds present, no guarding or rebound. Extremities: No pitting edema, distal pulses 2+. Skin:  Warm and dry. Musculoskeletal: No kyphosis. Neuropsychiatric: Alert and oriented x3, affect grossly appropriate.  ECG: I personally reviewed the tracing from 04/07/2016 which showed sinus rhythm with LAE and NSST changes.  Recent Labwork:  January 2015: BUN 16, creatinine 0.78, AST 24, ALT 28, cholesterol 193, triglycerides 197, HDL 39, LDL 115, potassium 4.7, TSH 2.3, hemoglobin 14.0, platelets 191  Other Studies Reviewed Today:  Echocardiogram 02/07/2014: Study Conclusions  - Left ventricle: The cavity size was normal. Wall thickness was increased in a pattern of mild LVH. Systolic function was vigorous. The estimated ejection fraction was in the range of 65% to 70%. Wall motion was normal; there were no regional wall motion abnormalities. Doppler parameters are consistent with abnormal left ventricular relaxation (grade 1 diastolic dysfunction). - Aortic valve: A 21 mm [facility] Ease bioprosthesis was present. No significant regurgitation or paravalvular leak, Mean gradient: 16mm Hg (S). VTI ratio of LVOT to aortic valve: 0.58. Peak velocity somewhat higher than expected, but suspect related to increased cardiac output. Mean gradient and LVOT/AV ratio are consistent with normal prosthetic function. - Mitral valve: Trivial regurgitation. - Right ventricle: The cavity size was mildly dilated. Systolic function was normal. - Right atrium: Central venous pressure: 3mm Hg (est). - Atrial septum: No defect or patent foramen ovale was identified based on limited views. - Tricuspid valve: Trivial regurgitation. - Pulmonary arteries: PA peak pressure: 23mm Hg (S). - Pericardium, extracardiac: There was no pericardial effusion.  Impressions:  - Mild LVH with normal LV chamber size, LVEF 65-70%. Grade 1 diastolic dysfunction. Normally functioningbioprosthetic AVR as detailed above. Mild right ventricular enlargement with normal  contraction.Normal PASP 23 mm mercury. No pericardial effusion.  Assessment and Plan:  1. Aortic valve disease status post bioprosthetic AVR in 2013. Follow-up echocardiogram from 2015 is outlined above. She has had no change on examination. Continue observation for now.  2. Essential hypertension, blood pressure is well controlled today.  3. History of diastolic heart failure. No progressive symptoms, weight is stable. She is on Dyazide for blood pressure control.  4. History of renal insufficiency, requesting lab work from Dr. Margo Common.  Current medicines were reviewed with the patient today.   Orders Placed This Encounter  Procedures  . EKG 12-Lead    Disposition: Follow-up in one year.  Signed, Jonelle Sidle, MD, [facility] 04/11/2017 11:32 AM    [facility] at [facility] [address] [place], [place], [place] [address] Phone: [phone]; Fax: [phone]

## 2017-04-11 ENCOUNTER — Encounter: Payer: Self-pay | Admitting: *Deleted

## 2017-04-11 ENCOUNTER — Encounter: Payer: Self-pay | Admitting: Cardiology

## 2017-04-11 ENCOUNTER — Ambulatory Visit (INDEPENDENT_AMBULATORY_CARE_PROVIDER_SITE_OTHER): Payer: BLUE CROSS/BLUE SHIELD | Admitting: Cardiology

## 2017-04-11 VITALS — BP 110/80 | HR 75 | Ht 66.5 in | Wt 181.6 lb

## 2017-04-11 DIAGNOSIS — I5032 Chronic diastolic (congestive) heart failure: Secondary | ICD-10-CM | POA: Diagnosis not present

## 2017-04-11 DIAGNOSIS — N289 Disorder of kidney and ureter, unspecified: Secondary | ICD-10-CM

## 2017-04-11 DIAGNOSIS — Z953 Presence of xenogenic heart valve: Secondary | ICD-10-CM | POA: Diagnosis not present

## 2017-04-11 DIAGNOSIS — I10 Essential (primary) hypertension: Secondary | ICD-10-CM | POA: Diagnosis not present

## 2017-04-11 NOTE — Patient Instructions (Signed)

## 2018-04-12 NOTE — Progress Notes (Signed)
Cardiology Office Note  Date: 04/13/2018   ID: Courtney Harvey, DOB 10/12/1956, MRN [id number]  PCP: Louie Boston., MD  Primary Cardiologist: Nona Dell, MD   Chief Complaint  Patient presents with  . Aortic valve disease    History of Present Illness: Courtney Harvey is a 62 y.o. female last seen in June 2018.  She presents for a follow-up visit.  Reports NYHA class II dyspnea on exertion, no angina symptoms, palpitations, or syncope.  She has gained about 5 pounds over the last year.  Does not exercise regularly, we talked about a walking plan.  She continues to follow with PCP, currently on Dyazide for treatment of hypertension.  We discussed obtaining a follow-up echocardiogram in comparison to the previous study in 2015.  I personally reviewed her ECG today which shows sinus rhythm with R' in lead V1 and V2, nonspecific T wave changes.  Past Medical History:  Diagnosis Date  . Aortic insufficiency and aortic stenosis    AR/AS by echo/cath 3/013 [name]) s/p tissue AVR 01/26/12 (EF 55-60%, no CAD by cath at that time)  . Chronic diastolic heart failure (HCC)   . Essential hypertension   . H/O hiatal hernia   . History of cardiac catheterization    Normal coronaries, March 2012  . Recurrent upper respiratory infection (URI)   . S/P aortic valve replacement 01/26/2012   21mm [facility] Ease pericardial tissue valve  . S/P patent foramen ovale closure 01/26/2012   PFO found on TEE prior to AVR. Performed at the time of aortic valve replacement    Past Surgical History:  Procedure Laterality Date  . AORTIC VALVE REPLACEMENT  01/26/2012   Procedure: AORTIC VALVE REPLACEMENT (AVR);  Surgeon: Purcell Nails, MD;  Location[facility];  Service: Open Heart Surgery;  Laterality: N/A;  . CARDIAC CATHETERIZATION  2005 and 2013   Friday March 8th       pcp Dr tapper  in [place]       . LEFT AND RIGHT HEART CATHETERIZATION WITH CORONARY ANGIOGRAM N/A 01/07/2012   Procedure:  LEFT AND RIGHT HEART CATHETERIZATION WITH CORONARY ANGIOGRAM;  Surgeon: Wendall Stade, MD;  [facility] LAB;  Service: Cardiovascular;  Laterality: N/A;  . TEE WITHOUT CARDIOVERSION  01/10/2012   Procedure: TRANSESOPHAGEAL ECHOCARDIOGRAM (TEE);  Surgeon: Pricilla Riffle, MD;  Location: [facility] ENDOSCOPY;  Service: Cardiovascular;  Laterality: N/A;  TEE call Trish in am for time    Current Outpatient Medications  Medication Sig Dispense Refill  . cetirizine (ZYRTEC) 10 MG tablet Take 10 mg by mouth daily.    Marland Kitchen triamterene-hydrochlorothiazide (DYAZIDE) 37.5-25 MG per capsule Take 1 capsule by mouth every morning.      No current facility-administered medications for this visit.    Allergies:  Peanut-containing drug products and Wellbutrin [bupropion hcl]   Social History: The patient  reports that she quit smoking about 6 years ago. Her smoking use included cigarettes. She has a 40.00 pack-year smoking history. She has never used smokeless tobacco. She reports that she does not drink alcohol or use drugs.   ROS:  Please see the history of present illness. Otherwise, complete review of systems is positive for none.  All other systems are reviewed and negative.   Physical Exam: VS:  BP 102/78   Pulse (!) 54   Ht 5\' 7"  (1.702 m)   Wt 187 lb (84.8 kg)   LMP  (LMP Unknown)   SpO2 98%  BMI 29.29 kg/m , BMI Body mass index is 29.29 kg/m.  Wt Readings from Last 3 Encounters:  04/13/18 187 lb (84.8 kg)  04/11/17 181 lb 9.6 oz (82.4 kg)  04/07/16 183 lb 3.2 oz (83.1 kg)    General: Patient appears comfortable at rest. HEENT: Conjunctiva and lids normal, oropharynx clear. Neck: Supple, no elevated JVP or carotid bruits, no thyromegaly. Lungs: Clear to auscultation, nonlabored breathing at rest. Cardiac: Regular rate and rhythm, no S3, 2/6 systolic murmur, no pericardial rub. Abdomen: Soft, nontender, bowel sounds present. Extremities: No pitting edema, distal pulses 2+. Skin: Warm and  dry. Musculoskeletal: No kyphosis. Neuropsychiatric: Alert and oriented x3, affect grossly appropriate.  ECG: I personally reviewed the tracing from 04/11/2017 which showed sinus rhythm with small R' in lead V1 and V2.  Recent Labwork:  September 2017: Triglycerides 210, cholesterol 160, HDL 38, LDL 82, BUN 15, creatinine 0.85, AST 31, ALT 42, potassium 4.9, hemoglobin 13.6, platelets 242 May 2018: BUN 19, creatinine 0.99, AST 31, ALT 34, potassium 4.6  Other Studies Reviewed Today:  Echocardiogram 02/07/2014: Study Conclusions  - Left ventricle: The cavity size was normal. Wall thickness was increased in a pattern of mild LVH. Systolic function was vigorous. The estimated ejection fraction was in the range of 65% to 70%. Wall motion was normal; there were no regional wall motion abnormalities. Doppler parameters are consistent with abnormal left ventricular relaxation (grade 1 diastolic dysfunction). - Aortic valve: A 21 mm [facility] Ease bioprosthesis was present. No significant regurgitation or paravalvular leak, Mean gradient: 16mm Hg (S). VTI ratio of LVOT to aortic valve: 0.58. Peak velocity somewhat higher than expected, but suspect related to increased cardiac output. Mean gradient and LVOT/AV ratio are consistent with normal prosthetic function. - Mitral valve: Trivial regurgitation. - Right ventricle: The cavity size was mildly dilated. Systolic function was normal. - Right atrium: Central venous pressure: 3mm Hg (est). - Atrial septum: No defect or patent foramen ovale was identified based on limited views. - Tricuspid valve: Trivial regurgitation. - Pulmonary arteries: PA peak pressure: 23mm Hg (S). - Pericardium, extracardiac: There was no pericardial effusion.  Impressions:  - Mild LVH with normal LV chamber size, LVEF 65-70%. Grade 1 diastolic dysfunction. Normally functioningbioprosthetic AVR as detailed above. Mild right  ventricular enlargement with normal contraction.Normal PASP 23 mm mercury. No pericardial effusion.  Assessment and Plan:  1.  History of aortic stenosis and regurgitation status post bioprosthetic AVR in 2013.  Her last echocardiogram was in 2015, we discussed obtaining a follow-up study for reassessment.  Cardiac murmur is relatively stable, she reports no fevers or chills and remains stable in terms of dyspnea on exertion.  2.  History of diastolic heart failure.  She is on Dyazide for treatment of high blood pressure, blood pressure and heart rate are well controlled today.  She has not required a higher dose standing diuretic.  3.  Essential hypertension, well controlled today.  Current medicines were reviewed with the patient today.   Orders Placed This Encounter  Procedures  . EKG 12-Lead  . ECHOCARDIOGRAM COMPLETE    Disposition: Follow-up in 1 year.  Signed, Jonelle SidleSamuel G. McDowell, MD, [facility] 04/13/2018 9:44 AM    [facility] [address] [place], [place], [place] [address] Phone: [phone]; Fax: [phone]

## 2018-04-13 ENCOUNTER — Encounter: Payer: Self-pay | Admitting: Cardiology

## 2018-04-13 ENCOUNTER — Ambulatory Visit: Payer: BLUE CROSS/BLUE SHIELD | Admitting: Cardiology

## 2018-04-13 ENCOUNTER — Telehealth: Payer: Self-pay | Admitting: Cardiology

## 2018-04-13 VITALS — BP 102/78 | HR 54 | Ht 67.0 in | Wt 187.0 lb

## 2018-04-13 DIAGNOSIS — I10 Essential (primary) hypertension: Secondary | ICD-10-CM | POA: Diagnosis not present

## 2018-04-13 DIAGNOSIS — Z953 Presence of xenogenic heart valve: Secondary | ICD-10-CM | POA: Diagnosis not present

## 2018-04-13 DIAGNOSIS — I5032 Chronic diastolic (congestive) heart failure: Secondary | ICD-10-CM | POA: Diagnosis not present

## 2018-04-13 NOTE — Patient Instructions (Signed)
Medication Instructions:  Your physician recommends that you continue on your current medications as directed. Please refer to the Current Medication list given to you today.  Labwork: NONE  Testing/Procedures: Your physician has requested that you have an echocardiogram. Echocardiography is a painless test that uses sound waves to create images of your heart. It provides your doctor with information about the size and shape of your heart and how well your heart's chambers and valves are working. This procedure takes approximately one hour. There are no restrictions for this procedure.  Follow-Up: Your physician wants you to follow-up in: 1 YEAR WITH DR. MCDOWELL. You will receive a reminder letter in the mail two months in advance. If you don't receive a letter, please call our office to schedule the follow-up appointment.  Any Other Special Instructions Will Be Listed Below (If Applicable).  If you need a refill on your cardiac medications before your next appointment, please call your pharmacy. 

## 2018-04-13 NOTE — Telephone Encounter (Signed)
E[facility]- [facility] [place]  May 17, 2018

## 2018-04-26 ENCOUNTER — Other Ambulatory Visit: Payer: Self-pay | Admitting: Cardiology

## 2018-04-26 DIAGNOSIS — I359 Nonrheumatic aortic valve disorder, unspecified: Secondary | ICD-10-CM

## 2018-04-26 DIAGNOSIS — I509 Heart failure, unspecified: Secondary | ICD-10-CM

## 2018-05-17 ENCOUNTER — Other Ambulatory Visit: Payer: Self-pay

## 2018-05-17 ENCOUNTER — Ambulatory Visit (INDEPENDENT_AMBULATORY_CARE_PROVIDER_SITE_OTHER): Payer: BLUE CROSS/BLUE SHIELD

## 2018-05-17 DIAGNOSIS — I359 Nonrheumatic aortic valve disorder, unspecified: Secondary | ICD-10-CM | POA: Diagnosis not present

## 2018-05-17 DIAGNOSIS — I509 Heart failure, unspecified: Secondary | ICD-10-CM

## 2018-05-19 ENCOUNTER — Telehealth: Payer: Self-pay | Admitting: *Deleted

## 2018-05-19 NOTE — Telephone Encounter (Signed)
-----   Message from Jonelle SidleSamuel G McDowell, MD sent at 05/19/2018  4:30 PM EDT ----- Results reviewed. LVEF remains normal and aortic valve bioprosthesis shows stable function. Continue with same follow-up plan. A copy of this test should be forwarded to Louie Bostonapper, David B., MD.

## 2018-05-22 NOTE — Telephone Encounter (Signed)
Patient informed. Copy sent to PCP °

## 2018-08-11 ENCOUNTER — Other Ambulatory Visit (HOSPITAL_COMMUNITY)
Admission: RE | Admit: 2018-08-11 | Discharge: 2018-08-11 | Disposition: A | Discharge status: Home / Self Care | Payer: BLUE CROSS/BLUE SHIELD | Source: Ambulatory Visit | Attending: Obstetrics & Gynecology | Admitting: Obstetrics & Gynecology

## 2018-08-11 ENCOUNTER — Ambulatory Visit: Payer: BLUE CROSS/BLUE SHIELD | Admitting: Obstetrics & Gynecology

## 2018-08-11 ENCOUNTER — Other Ambulatory Visit: Payer: Self-pay | Admitting: Obstetrics & Gynecology

## 2018-08-11 ENCOUNTER — Encounter: Payer: Self-pay | Admitting: Obstetrics & Gynecology

## 2018-08-11 ENCOUNTER — Other Ambulatory Visit: Payer: Self-pay

## 2018-08-11 VITALS — BP 123/89 | HR 70 | Ht 66.5 in | Wt 185.0 lb

## 2018-08-11 DIAGNOSIS — Z124 Encounter for screening for malignant neoplasm of cervix: Secondary | ICD-10-CM

## 2018-08-11 DIAGNOSIS — N950 Postmenopausal bleeding: Secondary | ICD-10-CM | POA: Diagnosis not present

## 2018-08-11 MED ORDER — AMOXICILLIN-POT CLAVULANATE 875-125 MG PO TABS: 1.0000 | ORAL_TABLET | Freq: Two times a day (BID) | ORAL | 0 refills | Status: DC

## 2018-08-11 NOTE — Addendum Note (Signed)
Addended by: Tish Frederickson A on: 08/11/2018 10:09 AM   Modules accepted: Orders

## 2018-08-11 NOTE — Progress Notes (Signed)
Endometrial Biopsy Procedure Note  Pre-operative Diagnosis: PMB  Post-operative Diagnosis: same  Indications: postmenopausal bleeding  Procedure Details   Urine pregnancy test was not done.  The risks (including infection, bleeding, pain, and uterine perforation) and benefits of the procedure were explained to the patient and Written informed consent was obtained.  Antibiotic prophylaxis against endocarditis was  Indicated. augmentin for 3 days is prescribed   The patient was placed in the dorsal lithotomy position.  Bimanual exam showed the uterus to be in the neutral position.  A Graves' speculum inserted in the vagina, and the cervix prepped with povidone iodine.  Endocervical curettage with a Kevorkian curette was not performed.   A sharp tenaculum was applied to the anterior lip of the cervix for stabilization.  A sterile uterine sound was used to sound the uterus to a depth of 6.5cm.  A Pipelle endometrial aspirator was used to sample the endometrium.  Sample was sent for pathologic examination.  Condition: Stable  Complications: None  Plan:  The patient was advised to call for any fever or for prolonged or severe pain or bleeding. She was advised to use OTC ibuprofen as needed for mild to moderate pain. She was advised to avoid vaginal intercourse for 48 hours or until the bleeding has completely stopped.  Attending Physician Documentation: I performed the procedure  Follow up 1 week for: results and sonogram   Orders Placed This Encounter  Procedures  . US PELVIS (TRANSABDOMINAL ONLY)  . US PELVIS TRANSVANGINAL NON-OB (TV ONLY)    Meds ordered this encounter  Medications  . amoxicillin-clavulanate (AUGMENTIN) 875-125 MG tablet    Sig: Take 1 tablet by mouth 2 (two) times daily.    Dispense:  6 tablet    Refill:  0  \ Lazaro Arms, MD 08/11/2018 9:56 AM

## 2018-08-15 LAB — CYTOLOGY - PAP
DIAGNOSIS: NEGATIVE
HPV (WINDOPATH): NOT DETECTED

## 2018-08-18 ENCOUNTER — Other Ambulatory Visit: Payer: Self-pay

## 2018-08-18 ENCOUNTER — Ambulatory Visit (INDEPENDENT_AMBULATORY_CARE_PROVIDER_SITE_OTHER): Payer: BLUE CROSS/BLUE SHIELD

## 2018-08-18 ENCOUNTER — Encounter: Payer: Self-pay | Admitting: Obstetrics & Gynecology

## 2018-08-18 ENCOUNTER — Ambulatory Visit: Payer: BLUE CROSS/BLUE SHIELD | Admitting: Obstetrics & Gynecology

## 2018-08-18 VITALS — BP 116/77 | HR 92 | Ht 66.5 in | Wt 185.0 lb

## 2018-08-18 DIAGNOSIS — N840 Polyp of corpus uteri: Secondary | ICD-10-CM | POA: Diagnosis not present

## 2018-08-18 DIAGNOSIS — N950 Postmenopausal bleeding: Secondary | ICD-10-CM

## 2018-08-18 DIAGNOSIS — R9389 Abnormal findings on diagnostic imaging of other specified body structures: Secondary | ICD-10-CM

## 2018-08-18 DIAGNOSIS — Z952 Presence of prosthetic heart valve: Secondary | ICD-10-CM | POA: Diagnosis not present

## 2018-08-18 NOTE — Progress Notes (Signed)
Follow up appointment for results  Chief Complaint  Patient presents with  . Follow-up    biopsy results and gyn u/s    Blood pressure 116/77, pulse 92, height 5' 6.5" (1.689 m), weight 185 lb (83.9 kg).  US Pelvis Transvanginal Non-ob (tv Only)  Result Date: 08/18/2018 GYNECOLOGIC SONOGRAM Courtney Harvey is a 62 y.o. G2P2 she is here for a pelvic sonogram for postmenopausal bleeding . Uterus                      5.9 x 2.7 x 4.8 cm, vol 39, homogenous retroverted uterus,wnl Endometrium          10.8 mm, symmetrical,complex thickened endometrium Right ovary             2 x 1.9 x 1 cm, small echogenic right ovarian mass w/posterior shadowing 5 x 4 x 4 mm Left ovary                2 x 1.2 x 1.6 cm, wnl No free fluid Technician Comments: PELVIC US TA/TV: homogenous retroverted uterus,wnl, complex thickened endometrium 10.8 mm,no color flow w/in the endometrium,normal left ovary,small echogenic right ovarian mass w/posterior shadowing 5 x 4 x 4 mm,ovaries appear mobile,no free fluid,no pain during ultrasound [employer] 08/18/2018 11:34 AM Clinical Impression and recommendations: I have reviewed the sonogram results above, combined with the patient's current clinical course, below are my impressions and any appropriate recommendations for management based on the sonographic findings. Uterus is normal Endometrium is pathologic, complex and thickened, biopsy reveals polyp Ovaries are normal, probably very small dermoid in the right ovary, not clinically significant Lazaro Arms 08/18/2018 11:39 AM   US Pelvis (transabdominal Only)  Result Date: 08/18/2018 GYNECOLOGIC SONOGRAM Courtney Harvey is a 62 y.o. G2P2 she is here for a pelvic sonogram for postmenopausal bleeding . Uterus                      5.9 x 2.7 x 4.8 cm, vol 39, homogenous retroverted uterus,wnl Endometrium          10.8 mm, symmetrical,complex thickened endometrium Right ovary             2 x 1.9 x 1 cm, small echogenic right ovarian  mass w/posterior shadowing 5 x 4 x 4 mm Left ovary                2 x 1.2 x 1.6 cm, wnl No free fluid Technician Comments: PELVIC US TA/TV: homogenous retroverted uterus,wnl, complex thickened endometrium 10.8 mm,no color flow w/in the endometrium,normal left ovary,small echogenic right ovarian mass w/posterior shadowing 5 x 4 x 4 mm,ovaries appear mobile,no free fluid,no pain during ultrasound [employer] 08/18/2018 11:34 AM Clinical Impression and recommendations: I have reviewed the sonogram results above, combined with the patient's current clinical course, below are my impressions and any appropriate recommendations for management based on the sonographic findings. Uterus is normal Endometrium is pathologic, complex and thickened, biopsy reveals polyp Ovaries are normal, probably very small dermoid in the right ovary, not clinically significant Lazaro Arms 08/18/2018 11:39 AM      MEDS ordered this encounter: No orders of the defined types were placed in this encounter.   Orders for this encounter: No orders of the defined types were placed in this encounter.   Impression: Endometrial polyp  PMB (postmenopausal bleeding)  Thickened endometrium  S/P aortic valve replacement    Plan: Pt  has endometrial biopsy showing endometrial polyp and sonogram is thickened heterogenous so needs a formal evluation with hysteroscopy and uterine curettage  09/06/2018  Pt has hisotry of AVR up to date reviewed and appears to not recommend prophylaxis for GU procedures, however, since having a uterine curettage I feel clinically still indicated, will use 2 grams amoxicillin 60 minutes prior to surgery  Follow Up: Return in about 27 days (around 09/14/2018) for Post Op, with Dr Despina Hidden.       Face to face time:  10 minutes  Greater than 50% of the visit time was spent in counseling and coordination of care with the patient.  The summary and outline of the counseling and care coordination is  summarized in the note above.   All questions were answered.  Past Medical History:  Diagnosis Date  . Aortic insufficiency and aortic stenosis    AR/AS by echo/cath 3/013 [name]) s/p tissue AVR 01/26/12 (EF 55-60%, no CAD by cath at that time)  . Chronic diastolic heart failure (HCC)   . Essential hypertension   . H/O hiatal hernia   . History of cardiac catheterization    Normal coronaries, March 2012  . Recurrent upper respiratory infection (URI)   . S/P aortic valve replacement 01/26/2012   21mm [employer] Ease pericardial tissue valve  . S/P patent foramen ovale closure 01/26/2012   PFO found on TEE prior to AVR. Performed at the time of aortic valve replacement    Past Surgical History:  Procedure Laterality Date  . AORTIC VALVE REPLACEMENT  01/26/2012   Procedure: AORTIC VALVE REPLACEMENT (AVR);  Surgeon: Purcell Nails, MD;  Location: [facility] OR;  Service: Open Heart Surgery;  Laterality: N/A;  . CARDIAC CATHETERIZATION  2005 and 2013   Friday March 8th       pcp Dr tapper  in [place]       . LEFT AND RIGHT HEART CATHETERIZATION WITH CORONARY ANGIOGRAM N/A 01/07/2012   Procedure: LEFT AND RIGHT HEART CATHETERIZATION WITH CORONARY ANGIOGRAM;  Surgeon: Wendall Stade, MD;  Location: [facility] CATH LAB;  Service: Cardiovascular;  Laterality: N/A;  . TEE WITHOUT CARDIOVERSION  01/10/2012   Procedure: TRANSESOPHAGEAL ECHOCARDIOGRAM (TEE);  Surgeon: Pricilla Riffle, MD;  Location: [facility] ENDOSCOPY;  Service: Cardiovascular;  Laterality: N/A;  TEE call Trish in am for time    OB History    Gravida  2   Para  2   Term      Preterm      AB      Living  2     SAB      TAB      Ectopic      Multiple      Live Births              Allergies  Allergen Reactions  . Peanut-Containing Drug Products Other (See Comments)    headache  . Wellbutrin [Bupropion Hcl]     itching    Social History   Socioeconomic History  . Marital status: Single    Spouse name: Not on file  .  Number of children: 2  . Years of education: Not on file  . Highest education level: Not on file  Occupational History    Employer: [employer]  Social Needs  . Financial resource strain: Not on file  . Food insecurity:    Worry: Not on file    Inability: Not on file  . Transportation needs:    Medical: Not  on file    Non-medical: Not on file  Tobacco Use  . Smoking status: Former Smoker    Packs/day: 1.00    Years: 40.00    Pack years: 40.00    Types: Cigarettes    Last attempt to quit: 01/06/2012    Years since quitting: 6.6  . Smokeless tobacco: Never Used  Substance and Sexual Activity  . Alcohol use: No    Alcohol/week: 0.0 standard drinks  . Drug use: No  . Sexual activity: Not Currently    Birth control/protection: Post-menopausal  Lifestyle  . Physical activity:    Days per week: Not on file    Minutes per session: Not on file  . Stress: Not on file  Relationships  . Social connections:    Talks on phone: Not on file    Gets together: Not on file    Attends religious service: Not on file    Active member of club or organization: Not on file    Attends meetings of clubs or organizations: Not on file    Relationship status: Not on file  Other Topics Concern  . Not on file  Social History Narrative   She is from [place], [place]. She is widowed. Her husband committed suicide sometime last year, but she feels that she has dealt with it. She currently lives alone and works at [employer].     Family History  Problem Relation Age of Onset  . Lung cancer Father   . Uterine cancer Mother   . Hypertension Brother   . Cancer Sister   . Hypertension Son

## 2018-08-18 NOTE — Progress Notes (Signed)
PELVIC US TA/TV: homogenous retroverted uterus,wnl, complex thickened endometrium 10.8 mm,no color flow w/in the endometrium,normal left ovary,small echogenic right ovarian mass w/posterior shadowing 5 x 4 x 4 mm,ovaries appear mobile,no free fluid,no pain during ultrasound

## 2018-08-24 NOTE — Patient Instructions (Signed)
Courtney Harvey  08/24/2018     @PREFPERIOPPHARMACY @   Your procedure is scheduled on  09/06/2018 .  Report to [facility] at  930   A.M.  Call this number if you have problems the morning of surgery:  [phone]   Remember:  Do not eat or drink after midnight.                       Take these medicines the morning of surgery with A SIP OF WATER  Zyrtec, maxzide.    Do not wear jewelry, make-up or nail polish.  Do not wear lotions, powders, or perfumes, or deodorant.  Do not shave 48 hours prior to surgery.  Men may shave face and neck.  Do not bring valuables to the hospital.  [facility] is not responsible for any belongings or valuables.  Contacts, dentures or bridgework may not be worn into surgery.  Leave your suitcase in the car.  After surgery it may be brought to your room.  For patients admitted to the hospital, discharge time will be determined by your treatment team.  Patients discharged the day of surgery will not be allowed to drive home.   Name and phone number of your driver:   family Special instructions:   None Please read over the following fact sheets that you were given. Anesthesia Post-op Instructions and Care and Recovery After Surgery       Dilation and Curettage or Vacuum Curettage Dilation and curettage (D&C) and vacuum curettage are minor procedures. A D&C involves stretching (dilation) the cervix and scraping (curettage) the inside lining of the uterus (endometrium). During a D&C, tissue is gently scraped from the endometrium, starting from the top portion of the uterus down to the lowest part of the uterus (cervix). During a vacuum curettage, the lining and tissue in the uterus are removed with the use of gentle suction. Curettage may be performed to either diagnose or treat a problem. As a diagnostic procedure, curettage is performed to examine tissues from the uterus. A diagnostic curettage may be done if you  have:  Irregular bleeding in the uterus.  Bleeding with the development of clots.  Spotting between menstrual periods.  Prolonged menstrual periods or other abnormal bleeding.  Bleeding after menopause.  No menstrual period (amenorrhea).  A change in size and shape of the uterus.  Abnormal endometrial cells discovered during a Pap test.  As a treatment procedure, curettage may be performed for the following reasons:  Removal of an IUD (intrauterine device).  Removal of retained placenta after giving birth.  Abortion.  Miscarriage.  Removal of endometrial polyps.  Removal of uncommon types of noncancerous lumps (fibroids).  Tell a health care provider about:  Any allergies you have, including allergies to prescribed medicine or latex.  All medicines you are taking, including vitamins, herbs, eye drops, creams, and over-the-counter medicines. This is especially important if you take any blood-thinning medicine. Bring a list of all of your medicines to your appointment.  Any problems you or family members have had with anesthetic medicines.  Any blood disorders you have.  Any surgeries you have had.  Your medical history and any medical conditions you have.  Whether you are pregnant or may be pregnant.  Recent vaginal infections you have had.  Recent menstrual periods, bleeding problems you have had, and what form of birth control (contraception) you use. What  are the risks? Generally, this is a safe procedure. However, problems may occur, including:  Infection.  Heavy vaginal bleeding.  Allergic reactions to medicines.  Damage to the cervix or other structures or organs.  Development of scar tissue (adhesions) inside the uterus, which can cause abnormal amounts of menstrual bleeding. This may make it harder to get pregnant in the future.  A hole (perforation) or puncture in the uterine wall. This is rare.  What happens before the procedure? Staying  hydrated Follow instructions from your health care provider about hydration, which may include:  Up to 2 hours before the procedure - you may continue to drink clear liquids, such as water, clear fruit juice, black coffee, and plain tea.  Eating and drinking restrictions Follow instructions from your health care provider about eating and drinking, which may include:  8 hours before the procedure - stop eating heavy meals or foods such as meat, fried foods, or fatty foods.  6 hours before the procedure - stop eating light meals or foods, such as toast or cereal.  6 hours before the procedure - stop drinking milk or drinks that contain milk.  2 hours before the procedure - stop drinking clear liquids. If your health care provider told you to take your medicine(s) on the day of your procedure, take them with only a sip of water.  Medicines  Ask your health care provider about: ? Changing or stopping your regular medicines. This is especially important if you are taking diabetes medicines or blood thinners. ? Taking medicines such as aspirin and ibuprofen. These medicines can thin your blood. Do not take these medicines before your procedure if your health care provider instructs you not to.  You may be given antibiotic medicine to help prevent infection. General instructions  For 24 hours before your procedure, do not: ? Douche. ? Use tampons. ? Use medicines, creams, or suppositories in the vagina. ? Have sexual intercourse.  You may be given a pregnancy test on the day of the procedure.  Plan to have someone take you home from the hospital or clinic.  You may have a blood or urine sample taken.  If you will be going home right after the procedure, plan to have someone with you for 24 hours. What happens during the procedure?  To reduce your risk of infection: ? Your health care team will wash or sanitize their hands. ? Your skin will be washed with soap.  An IV tube will be  inserted into one of your veins.  You will be given one of the following: ? A medicine that numbs the area in and around the cervix (local anesthetic). ? A medicine to make you fall asleep (general anesthetic).  You will lie down on your back, with your feet in foot rests (stirrups).  The size and position of your uterus will be checked.  A lubricated instrument (speculum or Sims retractor) will be inserted into the back side of your vagina. The speculum will be used to hold apart the walls of your vagina so your health care provider can see your cervix.  A tool (tenaculum) will be attached to the lip of the cervix to stabilize it.  Your cervix will be softened and dilated. This may be done by: ? Taking a medicine. ? Having tapered dilators or thin rods (laminaria) or gradual widening instruments (tapered dilators) inserted into your cervix.  A small, sharp, curved instrument (curette) will be used to scrape a small amount of  tissue or cells from the endometrium or cervical canal. In some cases, gentle suction is applied with the curette. The curette will then be removed. The cells will be taken to a lab for testing. The procedure may vary among health care providers and hospitals. What happens after the procedure?  You may have mild cramping, backache, pain, and light bleeding or spotting. You may pass small blood clots from your vagina.  You may have to wear compression stockings. These stockings help to prevent blood clots and reduce swelling in your legs.  Your blood pressure, heart rate, breathing rate, and blood oxygen level will be monitored until the medicines you were given have worn off. Summary  Dilation and curettage (D&C) involves stretching (dilation) the cervix and scraping (curettage) the inside lining of the uterus (endometrium).  After the procedure, you may have mild cramping, backache, pain, and light bleeding or spotting. You may pass small blood clots from your  vagina.  Plan to have someone take you home from the hospital or clinic. This information is not intended to replace advice given to you by your health care provider. Make sure you discuss any questions you have with your health care provider. Document Released: 10/18/2005 Document Revised: 07/04/2016 Document Reviewed: 07/04/2016 Elsevier Interactive Patient Education  2018 [employer].  Dilation and Curettage or Vacuum Curettage, Care After These instructions give you information about caring for yourself after your procedure. Your doctor may also give you more specific instructions. Call your doctor if you have any problems or questions after your procedure. Follow these instructions at home: Activity  Do not drive or use heavy machinery while taking prescription pain medicine.  For 24 hours after your procedure, avoid driving.  Take short walks often, followed by rest periods. Ask your doctor what activities are safe for you. After one or two days, you may be able to return to your normal activities.  Do not lift anything that is heavier than 10 lb (4.5 kg) until your doctor approves.  For at least 2 weeks, or as long as told by your doctor: ? Do not douche. ? Do not use tampons. ? Do not have sex. General instructions  Take over-the-counter and prescription medicines only as told by your doctor. This is very important if you take blood thinning medicine.  Do not take baths, swim, or use a hot tub until your doctor approves. Take showers instead of baths.  Wear compression stockings as told by your doctor.  It is up to you to get the results of your procedure. Ask your doctor when your results will be ready.  Keep all follow-up visits as told by your doctor. This is important. Contact a doctor if:  You have very bad cramps that get worse or do not get better with medicine.  You have very bad pain in your belly (abdomen).  You cannot drink fluids without throwing up  (vomiting).  You get pain in a different part of the area between your belly and thighs (pelvis).  You have bad-smelling discharge from your vagina.  You have a rash. Get help right away if:  You are bleeding a lot from your vagina. A lot of bleeding means soaking more than one sanitary pad in an hour, for 2 hours in a row.  You have clumps of blood (blood clots) coming from your vagina.  You have a fever or chills.  Your belly feels very tender or hard.  You have chest pain.  You have trouble  breathing.  You cough up blood.  You feel dizzy.  You feel light-headed.  You pass out (faint).  You have pain in your neck or shoulder area. Summary  Take short walks often, followed by rest periods. Ask your doctor what activities are safe for you. After one or two days, you may be able to return to your normal activities.  Do not lift anything that is heavier than 10 lb (4.5 kg) until your doctor approves.  Do not take baths, swim, or use a hot tub until your doctor approves. Take showers instead of baths.  Contact your doctor if you have any symptoms of infection, like bad-smelling discharge from your vagina. This information is not intended to replace advice given to you by your health care provider. Make sure you discuss any questions you have with your health care provider. Document Released: 07/27/2008 Document Revised: 07/05/2016 Document Reviewed: 07/05/2016 Elsevier Interactive Patient Education  2017 [place]. Hysteroscopy Hysteroscopy is a procedure used for looking inside the womb (uterus). It may be done for various reasons, including:  To evaluate abnormal bleeding, fibroid (benign, noncancerous) tumors, polyps, scar tissue (adhesions), and possibly cancer of the uterus.  To look for lumps (tumors) and other uterine growths.  To look for causes of why a woman cannot get pregnant (infertility), causes of recurrent loss of pregnancy (miscarriages), or a lost  intrauterine device (IUD).  To perform a sterilization by blocking the fallopian tubes from inside the uterus.  In this procedure, a thin, flexible tube with a tiny light and camera on the end of it (hysteroscope) is used to look inside the uterus. A hysteroscopy should be done right after a menstrual period to be sure you are not pregnant. LET [facility] CARE PROVIDER KNOW ABOUT:  Any allergies you have.  All medicines you are taking, including vitamins, herbs, eye drops, creams, and over-the-counter medicines.  Previous problems you or members of your family have had with the use of anesthetics.  Any blood disorders you have.  Previous surgeries you have had.  Medical conditions you have. RISKS AND COMPLICATIONS Generally, this is a safe procedure. However, as with any procedure, complications can occur. Possible complications include:  Putting a hole in the uterus.  Excessive bleeding.  Infection.  Damage to the cervix.  Injury to other organs.  Allergic reaction to medicines.  Too much fluid used in the uterus for the procedure.  BEFORE THE PROCEDURE  Ask your health care provider about changing or stopping any regular medicines.  Do not take aspirin or blood thinners for 1 week before the procedure, or as directed by your health care provider. These can cause bleeding.  If you smoke, do not smoke for 2 weeks before the procedure.  In some cases, a medicine is placed in the cervix the day before the procedure. This medicine makes the cervix have a larger opening (dilate). This makes it easier for the instrument to be inserted into the uterus during the procedure.  Do not eat or drink anything for at least 8 hours before the surgery.  Arrange for someone to take you home after the procedure. PROCEDURE  You may be given a medicine to relax you (sedative). You may also be given one of the following: ? A medicine that numbs the area around the cervix (local  anesthetic). ? A medicine that makes you sleep through the procedure (general anesthetic).  The hysteroscope is inserted through the vagina into the uterus. The camera on the  hysteroscope sends a picture to a TV screen. This gives the surgeon a good view inside the uterus.  During the procedure, air or a liquid is put into the uterus, which allows the surgeon to see better.  Sometimes, tissue is gently scraped from inside the uterus. These tissue samples are sent to a lab for testing. What to expect after the procedure  If you had a general anesthetic, you may be groggy for a couple hours after the procedure.  If you had a local anesthetic, you will be able to go home as soon as you are stable and feel ready.  You may have some cramping. This normally lasts for a couple days.  You may have bleeding, which varies from light spotting for a few days to menstrual-like bleeding for 3-7 days. This is normal.  If your test results are not back during the visit, make an appointment with your health care provider to find out the results. This information is not intended to replace advice given to you by your health care provider. Make sure you discuss any questions you have with your health care provider. Document Released: 01/24/2001 Document Revised: 03/25/2016 Document Reviewed: 05/17/2013 Elsevier Interactive Patient Education  2017 [place]. Hysteroscopy, Care After Refer to this sheet in the next few weeks. These instructions provide you with information on caring for yourself after your procedure. Your health care provider may also give you more specific instructions. Your treatment has been planned according to current medical practices, but problems sometimes occur. Call your health care provider if you have any problems or questions after your procedure. What can I expect after the procedure? After your procedure, it is typical to have the following:  You may have some cramping. This  normally lasts for a couple days.  You may have bleeding. This can vary from light spotting for a few days to menstrual-like bleeding for 3-7 days.  Follow these instructions at home:  Rest for the first 1-2 days after the procedure.  Only take over-the-counter or prescription medicines as directed by your health care provider. Do not take aspirin. It can increase the chances of bleeding.  Take showers instead of baths for 2 weeks or as directed by your health care provider.  Do not drive for 24 hours or as directed.  Do not drink alcohol while taking pain medicine.  Do not use tampons, douche, or have sexual intercourse for 2 weeks or until your health care provider says it is okay.  Take your temperature twice a day for 4-5 days. Write it down each time.  Follow your health care provider's advice about diet, exercise, and lifting.  If you develop constipation, you may: ? Take a mild laxative if your health care provider approves. ? Add bran foods to your diet. ? Drink enough fluids to keep your urine clear or pale yellow.  Try to have someone with you or available to you for the first 24-48 hours, especially if you were given a general anesthetic.  Follow up with your health care provider as directed. Contact a health care provider if:  You feel dizzy or lightheaded.  You feel sick to your stomach (nauseous).  You have abnormal vaginal discharge.  You have a rash.  You have pain that is not controlled with medicine. Get help right away if:  You have bleeding that is heavier than a normal menstrual period.  You have a fever.  You have increasing cramps or pain, not controlled with medicine.  You have new belly (abdominal) pain.  You pass out.  You have pain in the tops of your shoulders (shoulder strap areas).  You have shortness of breath. This information is not intended to replace advice given to you by your health care provider. Make sure you discuss any  questions you have with your health care provider. Document Released: 08/08/2013 Document Revised: 03/25/2016 Document Reviewed: 05/17/2013 Elsevier Interactive Patient Education  2017 [employer]  General Anesthesia, Adult General anesthesia is the use of medicines to make a person "go to sleep" (be unconscious) for a medical procedure. General anesthesia is often recommended when a procedure:  Is long.  Requires you to be still or in an unusual position.  Is major and can cause you to lose blood.  Is impossible to do without general anesthesia.  The medicines used for general anesthesia are called general anesthetics. In addition to making you sleep, the medicines:  Prevent pain.  Control your blood pressure.  Relax your muscles.  Tell a health care provider about:  Any allergies you have.  All medicines you are taking, including vitamins, herbs, eye drops, creams, and over-the-counter medicines.  Any problems you or family members have had with anesthetic medicines.  Types of anesthetics you have had in the past.  Any bleeding disorders you have.  Any surgeries you have had.  Any medical conditions you have.  Any history of heart or lung conditions, such as heart failure, sleep apnea, or chronic obstructive pulmonary disease (COPD).  Whether you are pregnant or may be pregnant.  Whether you use tobacco, alcohol, marijuana, or street drugs.  Any history of Financial pl[employer].  Any history of depression or anxiety. What are the risks? Generally, this is a safe procedure. However, problems may occur, including:  Allergic reaction to anesthetics.  Lung and heart problems.  Inhaling food or liquids from your stomach into your lungs (aspiration).  Injury to nerves.  Waking up during your procedure and being unable to move (rare).  Extreme agitation or a state of mental confusion (delirium) when you wake up from the anesthetic.  Air in the bloodstream,  which can lead to stroke.  These problems are more likely to develop if you are having a major surgery or if you have an advanced medical condition. You can prevent some of these complications by answering all of your health care provider's questions thoroughly and by following all pre-procedure instructions. General anesthesia can cause side effects, including:  Nausea or vomiting  A sore throat from the breathing tube.  Feeling cold or shivery.  Feeling tired, washed out, or achy.  Sleepiness or drowsiness.  Confusion or agitation.  What happens before the procedure? Staying hydrated Follow instructions from your health care provider about hydration, which may include:  Up to 2 hours before the procedure - you may continue to drink clear liquids, such as water, clear fruit juice, black coffee, and plain tea.  Eating and drinking restrictions Follow instructions from your health care provider about eating and drinking, which may include:  8 hours before the procedure - stop eating heavy meals or foods such as meat, fried foods, or fatty foods.  6 hours before the procedure - stop eating light meals or foods, such as toast or cereal.  6 hours before the procedure - stop drinking milk or drinks that contain milk.  2 hours before the procedure - stop drinking clear liquids.  Medicines  Ask your health care provider about: ? Changing or stopping  your regular medicines. This is especially important if you are taking diabetes medicines or blood thinners. ? Taking medicines such as aspirin and ibuprofen. These medicines can thin your blood. Do not take these medicines before your procedure if your health care provider instructs you not to. ? Taking new dietary supplements or medicines. Do not take these during the week before your procedure unless your health care provider approves them.  If you are told to take a medicine or to continue taking a medicine on the day of the  procedure, take the medicine with sips of water. General instructions   Ask if you will be going home the same day, the following day, or after a longer hospital stay. ? Plan to have someone take you home. ? Plan to have someone stay with you for the first 24 hours after you leave the hospital or clinic.  For 3-6 weeks before the procedure, try not to use any tobacco products, such as cigarettes, chewing tobacco, and e-cigarettes.  You may brush your teeth on the morning of the procedure, but make sure to spit out the toothpaste. What happens during the procedure?  You will be given anesthetics through a mask and through an IV tube in one of your veins.  You may receive medicine to help you relax (sedative).  As soon as you are asleep, a breathing tube may be used to help you breathe.  An anesthesia specialist will stay with you throughout the procedure. He or she will help keep you comfortable and safe by continuing to give you medicines and adjusting the amount of medicine that you get. He or she will also watch your blood pressure, pulse, and oxygen levels to make sure that the anesthetics do not cause any problems.  If a breathing tube was used to help you breathe, it will be removed before you wake up. The procedure may vary among health care providers and hospitals. What happens after the procedure?  You will wake up, often slowly, after the procedure is complete, usually in a recovery area.  Your blood pressure, heart rate, breathing rate, and blood oxygen level will be monitored until the medicines you were given have worn off.  You may be given medicine to help you calm down if you feel anxious or agitated.  If you will be going home the same day, your health care provider may check to make sure you can stand, drink, and urinate.  Your health care providers will treat your pain and side effects before you go home.  Do not drive for 24 hours if you received a  sedative.  You may: ? Feel nauseous and vomit. ? Have a sore throat. ? Have mental slowness. ? Feel cold or shivery. ? Feel sleepy. ? Feel tired. ? Feel sore or achy, even in parts of your body where you did not have surgery. This information is not intended to replace advice given to you by your health care provider. Make sure you discuss any questions you have with your health care provider. Document Released: 01/25/2008 Document Revised: 03/30/2016 Document Reviewed: 10/02/2015 Elsevier Interactive Patient Education  2018 [employer]. General Anesthesia, Adult, Care After These instructions provide you with information about caring for yourself after your procedure. Your health care provider may also give you more specific instructions. Your treatment has been planned according to current medical practices, but problems sometimes occur. Call your health care provider if you have any problems or questions after your procedure. What can  I expect after the procedure? After the procedure, it is common to have:  Vomiting.  A sore throat.  Mental slowness.  It is common to feel:  Nauseous.  Cold or shivery.  Sleepy.  Tired.  Sore or achy, even in parts of your body where you did not have surgery.  Follow these instructions at home: For at least 24 hours after the procedure:  Do not: ? Participate in activities where you could fall or become injured. ? Drive. ? Use heavy machinery. ? Drink alcohol. ? Take sleeping pills or medicines that cause drowsiness. ? Make important decisions or sign legal documents. ? Take care of children on your own.  Rest. Eating and drinking  If you vomit, drink water, juice, or soup when you can drink without vomiting.  Drink enough fluid to keep your urine clear or pale yellow.  Make sure you have little or no nausea before eating solid foods.  Follow the diet recommended by your health care provider. General instructions  Have a  responsible adult stay with you until you are awake and alert.  Return to your normal activities as told by your health care provider. Ask your health care provider what activities are safe for you.  Take over-the-counter and prescription medicines only as told by your health care provider.  If you smoke, do not smoke without supervision.  Keep all follow-up visits as told by your health care provider. This is important. Contact a health care provider if:  You continue to have nausea or vomiting at home, and medicines are not helpful.  You cannot drink fluids or start eating again.  You cannot urinate after 8-12 hours.  You develop a skin rash.  You have fever.  You have increasing redness at the site of your procedure. Get help right away if:  You have difficulty breathing.  You have chest pain.  You have unexpected bleeding.  You feel that you are having a life-threatening or urgent problem. This information is not intended to replace advice given to you by your health care provider. Make sure you discuss any questions you have with your health care provider. Document Released: 01/24/2001 Document Revised: 03/22/2016 Document Reviewed: 10/02/2015 Elsevier Interactive Patient Education  [employer].

## 2018-08-31 ENCOUNTER — Other Ambulatory Visit: Payer: Self-pay

## 2018-08-31 ENCOUNTER — Encounter (HOSPITAL_COMMUNITY): Payer: Self-pay

## 2018-08-31 ENCOUNTER — Other Ambulatory Visit: Payer: Self-pay | Admitting: Obstetrics & Gynecology

## 2018-08-31 ENCOUNTER — Encounter (HOSPITAL_COMMUNITY)
Admission: RE | Admit: 2018-08-31 | Discharge: 2018-08-31 | Disposition: A | Discharge status: Home / Self Care | Payer: BLUE CROSS/BLUE SHIELD | Source: Ambulatory Visit | Attending: Obstetrics & Gynecology | Admitting: Obstetrics & Gynecology

## 2018-08-31 DIAGNOSIS — Z01812 Encounter for preprocedural laboratory examination: Secondary | ICD-10-CM | POA: Insufficient documentation

## 2018-08-31 HISTORY — DX: Other specified postprocedural states: R11.2

## 2018-08-31 HISTORY — DX: Personal history of urinary calculi: Z87.442

## 2018-08-31 HISTORY — DX: Unspecified glaucoma: H40.9

## 2018-08-31 HISTORY — DX: Other specified postprocedural states: Z98.890

## 2018-08-31 LAB — COMPREHENSIVE METABOLIC PANEL
ALBUMIN: 3.7 g/dL (ref 3.5–5.0)
ALK PHOS: 69 U/L (ref 38–126)
ALT: 63 U/L — AB (ref 0–44)
AST: 53 U/L — AB (ref 15–41)
Anion gap: 7 (ref 5–15)
BILIRUBIN TOTAL: 0.8 mg/dL (ref 0.3–1.2)
BUN: 21 mg/dL (ref 8–23)
CO2: 28 mmol/L (ref 22–32)
CREATININE: 0.92 mg/dL (ref 0.44–1.00)
Calcium: 9.2 mg/dL (ref 8.9–10.3)
Chloride: 102 mmol/L (ref 98–111)
GFR calc Af Amer: >60 mL/min (ref >60)
GFR calc non Af Amer: >60 mL/min (ref >60)
GLUCOSE: 92 mg/dL (ref 70–99)
POTASSIUM: 3 mmol/L — AB (ref 3.5–5.1)
Sodium: 137 mmol/L (ref 135–145)
Total Protein: 7.6 g/dL (ref 6.5–8.1)

## 2018-08-31 LAB — URINALYSIS, ROUTINE W REFLEX MICROSCOPIC
BILIRUBIN URINE: NEGATIVE
Bacteria, UA: NONE SEEN
Glucose, UA: NEGATIVE mg/dL
Hgb urine dipstick: NEGATIVE
Ketones, ur: NEGATIVE mg/dL
NITRITE: NEGATIVE
PROTEIN: NEGATIVE mg/dL
SPECIFIC GRAVITY, URINE: 1.018 (ref 1.005–1.030)
pH: 6 (ref 5.0–8.0)

## 2018-08-31 LAB — CBC
HCT: 40.1 % (ref 36.0–46.0)
HEMOGLOBIN: 13.4 g/dL (ref 12.0–15.0)
MCH: 31.1 pg (ref 26.0–34.0)
MCHC: 33.4 g/dL (ref 30.0–36.0)
MCV: 93 fL (ref 80.0–100.0)
NRBC: 0 % (ref 0.0–0.2)
Platelets: 220 10*3/uL (ref 150–400)
RBC: 4.31 MIL/uL (ref 3.87–5.11)
RDW: 11.9 % (ref 11.5–15.5)
WBC: 8.4 10*3/uL (ref 4.0–10.5)

## 2018-08-31 LAB — RAPID HIV SCREEN (HIV 1/2 AB+AG)
HIV 1/2 Antibodies: NONREACTIVE
HIV-1 P24 ANTIGEN - HIV24: NONREACTIVE

## 2018-09-01 NOTE — Pre-Procedure Instructions (Signed)
Potassium of 3.0 routed to Dr Despina Hidden.

## 2018-09-06 ENCOUNTER — Ambulatory Visit (HOSPITAL_COMMUNITY): Payer: BLUE CROSS/BLUE SHIELD | Admitting: Anesthesiology

## 2018-09-06 ENCOUNTER — Ambulatory Visit (HOSPITAL_COMMUNITY)
Admission: RE | Admit: 2018-09-06 | Discharge: 2018-09-06 | Disposition: A | Discharge status: Home / Self Care | Payer: BLUE CROSS/BLUE SHIELD | Source: Ambulatory Visit | Attending: Obstetrics & Gynecology | Admitting: Obstetrics & Gynecology

## 2018-09-06 ENCOUNTER — Encounter (HOSPITAL_COMMUNITY)
Admission: RE | Disposition: A | Discharge status: Home / Self Care | Payer: Self-pay | Source: Ambulatory Visit | Attending: Obstetrics & Gynecology

## 2018-09-06 ENCOUNTER — Encounter (HOSPITAL_COMMUNITY): Payer: Self-pay

## 2018-09-06 DIAGNOSIS — Z91010 Allergy to peanuts: Secondary | ICD-10-CM | POA: Insufficient documentation

## 2018-09-06 DIAGNOSIS — I110 Hypertensive heart disease with heart failure: Secondary | ICD-10-CM | POA: Diagnosis not present

## 2018-09-06 DIAGNOSIS — I5032 Chronic diastolic (congestive) heart failure: Secondary | ICD-10-CM | POA: Insufficient documentation

## 2018-09-06 DIAGNOSIS — N950 Postmenopausal bleeding: Secondary | ICD-10-CM | POA: Insufficient documentation

## 2018-09-06 DIAGNOSIS — H409 Unspecified glaucoma: Secondary | ICD-10-CM | POA: Insufficient documentation

## 2018-09-06 DIAGNOSIS — Z888 Allergy status to other drugs, medicaments and biological substances status: Secondary | ICD-10-CM | POA: Diagnosis not present

## 2018-09-06 DIAGNOSIS — Z952 Presence of prosthetic heart valve: Secondary | ICD-10-CM | POA: Insufficient documentation

## 2018-09-06 DIAGNOSIS — K449 Diaphragmatic hernia without obstruction or gangrene: Secondary | ICD-10-CM | POA: Insufficient documentation

## 2018-09-06 DIAGNOSIS — Z87442 Personal history of urinary calculi: Secondary | ICD-10-CM | POA: Insufficient documentation

## 2018-09-06 DIAGNOSIS — Z87891 Personal history of nicotine dependence: Secondary | ICD-10-CM | POA: Insufficient documentation

## 2018-09-06 DIAGNOSIS — Z8249 Family history of ischemic heart disease and other diseases of the circulatory system: Secondary | ICD-10-CM | POA: Diagnosis not present

## 2018-09-06 DIAGNOSIS — R9389 Abnormal findings on diagnostic imaging of other specified body structures: Secondary | ICD-10-CM | POA: Diagnosis not present

## 2018-09-06 DIAGNOSIS — N840 Polyp of corpus uteri: Secondary | ICD-10-CM | POA: Diagnosis not present

## 2018-09-06 HISTORY — PX: HYSTEROSCOPY W/D&C: SHX1775

## 2018-09-06 SURGERY — DILATATION AND CURETTAGE /HYSTEROSCOPY
Anesthesia: General

## 2018-09-06 MED ORDER — MIDAZOLAM HCL 2 MG/2ML IJ SOLN
INTRAMUSCULAR | Status: AC
  Filled 2018-09-06: qty 2

## 2018-09-06 MED ORDER — KETOROLAC TROMETHAMINE 30 MG/ML IJ SOLN: 30.0000 mg | Freq: Once | INTRAMUSCULAR | Status: DC | PRN

## 2018-09-06 MED ORDER — HYDROMORPHONE HCL 1 MG/ML IJ SOLN: 0.2500 mg | INTRAMUSCULAR | Status: DC | PRN

## 2018-09-06 MED ORDER — LIDOCAINE HCL 1 % IJ SOLN
INTRAMUSCULAR | Status: DC | PRN
  Administered 2018-09-06: 40 mg via INTRADERMAL

## 2018-09-06 MED ORDER — KETOROLAC TROMETHAMINE 30 MG/ML IJ SOLN
30.0000 mg | Freq: Once | INTRAMUSCULAR | Status: AC
  Administered 2018-09-06: 30 mg via INTRAVENOUS
  Filled 2018-09-06: qty 1

## 2018-09-06 MED ORDER — LIDOCAINE HCL (PF) 1 % IJ SOLN
INTRAMUSCULAR | Status: AC
  Filled 2018-09-06: qty 5

## 2018-09-06 MED ORDER — EPHEDRINE SULFATE 50 MG/ML IJ SOLN
INTRAMUSCULAR | Status: DC | PRN
  Administered 2018-09-06: 5 mg via INTRAVENOUS

## 2018-09-06 MED ORDER — PROPOFOL 10 MG/ML IV BOLUS
INTRAVENOUS | Status: DC | PRN
  Administered 2018-09-06 (×2): 20 mg via INTRAVENOUS
  Administered 2018-09-06: 130 mg via INTRAVENOUS

## 2018-09-06 MED ORDER — 0.9 % SODIUM CHLORIDE (POUR BTL) OPTIME
TOPICAL | Status: DC | PRN
  Administered 2018-09-06: 1000 mL

## 2018-09-06 MED ORDER — LACTATED RINGERS IV SOLN
INTRAVENOUS | Status: DC | PRN
  Administered 2018-09-06: 08:00:00 via INTRAVENOUS

## 2018-09-06 MED ORDER — HYDROCODONE-ACETAMINOPHEN 7.5-325 MG PO TABS: 1.0000 | ORAL_TABLET | Freq: Once | ORAL | Status: DC | PRN

## 2018-09-06 MED ORDER — LACTATED RINGERS IV SOLN: INTRAVENOUS | Status: DC

## 2018-09-06 MED ORDER — KETOROLAC TROMETHAMINE 10 MG PO TABS: 10.0000 mg | ORAL_TABLET | Freq: Three times a day (TID) | ORAL | 0 refills | Status: DC | PRN

## 2018-09-06 MED ORDER — ONDANSETRON HCL 4 MG/2ML IJ SOLN: 4.0000 mg | Freq: Once | INTRAMUSCULAR | Status: DC | PRN

## 2018-09-06 MED ORDER — ONDANSETRON HCL 4 MG/2ML IJ SOLN
INTRAMUSCULAR | Status: AC
  Filled 2018-09-06: qty 2

## 2018-09-06 MED ORDER — ONDANSETRON 8 MG PO TBDP: 8.0000 mg | ORAL_TABLET | Freq: Three times a day (TID) | ORAL | 0 refills | Status: DC | PRN

## 2018-09-06 MED ORDER — SODIUM CHLORIDE 0.9 % IR SOLN
Status: DC | PRN
  Administered 2018-09-06: 1000 mL

## 2018-09-06 MED ORDER — FENTANYL CITRATE (PF) 100 MCG/2ML IJ SOLN
INTRAMUSCULAR | Status: DC | PRN
  Administered 2018-09-06 (×2): 25 ug via INTRAVENOUS

## 2018-09-06 MED ORDER — MIDAZOLAM HCL 5 MG/5ML IJ SOLN
INTRAMUSCULAR | Status: DC | PRN
  Administered 2018-09-06: 2 mg via INTRAVENOUS

## 2018-09-06 MED ORDER — SODIUM CHLORIDE 0.9 % IV SOLN
3.0000 g | INTRAVENOUS | Status: AC
  Administered 2018-09-06: 3 g via INTRAVENOUS
  Filled 2018-09-06 (×2): qty 3

## 2018-09-06 MED ORDER — FENTANYL CITRATE (PF) 100 MCG/2ML IJ SOLN
INTRAMUSCULAR | Status: AC
  Filled 2018-09-06: qty 2

## 2018-09-06 MED ORDER — MEPERIDINE HCL 50 MG/ML IJ SOLN: 6.2500 mg | INTRAMUSCULAR | Status: DC | PRN

## 2018-09-06 MED ORDER — ONDANSETRON HCL 4 MG/2ML IJ SOLN
INTRAMUSCULAR | Status: DC | PRN
  Administered 2018-09-06: 4 mg via INTRAVENOUS

## 2018-09-06 SURGICAL SUPPLY — 27 items
CLOTH BEACON ORANGE TIMEOUT ST (SAFETY) ×3 IMPLANT
COVER LIGHT HANDLE STERIS (MISCELLANEOUS) ×6 IMPLANT
GAUZE 4X4 16PLY RFD (DISPOSABLE) ×3 IMPLANT
GAUZE SPONGE 4X4 12PLY STRL (GAUZE/BANDAGES/DRESSINGS) ×3 IMPLANT
GLOVE BIO SURGEON STRL SZ7 (GLOVE) ×3 IMPLANT
GLOVE BIOGEL M 7.0 STRL (GLOVE) ×3 IMPLANT
GLOVE BIOGEL PI IND STRL 7.0 (GLOVE) ×2 IMPLANT
GLOVE BIOGEL PI IND STRL 8 (GLOVE) ×1 IMPLANT
GLOVE BIOGEL PI INDICATOR 7.0 (GLOVE) ×4
GLOVE BIOGEL PI INDICATOR 8 (GLOVE) ×2
GLOVE ECLIPSE 8.0 STRL XLNG CF (GLOVE) ×3 IMPLANT
GOWN STRL REUS W/TWL LRG LVL3 (GOWN DISPOSABLE) ×3 IMPLANT
GOWN STRL REUS W/TWL XL LVL3 (GOWN DISPOSABLE) ×3 IMPLANT
INST SET HYSTEROSCOPY (KITS) ×3 IMPLANT
IV NS 1000ML (IV SOLUTION) ×2
IV NS 1000ML BAXH (IV SOLUTION) ×1 IMPLANT
KIT TURNOVER CYSTO (KITS) ×3 IMPLANT
MANIFOLD NEPTUNE II (INSTRUMENTS) ×3 IMPLANT
MARKER SKIN DUAL TIP RULER LAB (MISCELLANEOUS) ×3 IMPLANT
NS IRRIG 1000ML POUR BTL (IV SOLUTION) ×3 IMPLANT
PACK BASIC III (CUSTOM PROCEDURE TRAY) ×2
PACK SRG BSC III STRL LF ECLPS (CUSTOM PROCEDURE TRAY) ×1 IMPLANT
PAD ARMBOARD 7.5X6 YLW CONV (MISCELLANEOUS) ×3 IMPLANT
PAD TELFA 3X4 1S STER (GAUZE/BANDAGES/DRESSINGS) ×3 IMPLANT
SET IRRIG Y TYPE TUR BLADDER L (SET/KITS/TRAYS/PACK) ×3 IMPLANT
SHEET LAVH (DRAPES) ×3 IMPLANT
YANKAUER SUCT BULB TIP 10FT TU (MISCELLANEOUS) ×3 IMPLANT

## 2018-09-06 NOTE — Anesthesia Procedure Notes (Signed)
Procedure Name: LMA Insertion Date/Time: 09/06/2018 9:16 AM Performed by: Despina Hidden, CRNA Pre-anesthesia Checklist: Patient identified, Patient being monitored, Emergency Drugs available, Timeout performed and Suction available Patient Re-evaluated:Patient Re-evaluated prior to induction Oxygen Delivery Method: Circle System Utilized Preoxygenation: Pre-oxygenation with 100% oxygen Induction Type: IV induction Ventilation: Mask ventilation without difficulty LMA: LMA inserted LMA Size: 4.0 Number of attempts: 1 Placement Confirmation: positive ETCO2 and breath sounds checked- equal and bilateral Tube secured with: Tape Dental Injury: Teeth and Oropharynx as per pre-operative assessment

## 2018-09-06 NOTE — Transfer of Care (Signed)
Immediate Anesthesia Transfer of Care Note  Patient: Courtney Harvey  Procedure(s) Performed: DILATATION AND CURETTAGE /HYSTEROSCOPY  ENDOMETRIAL POLYPECTOMY (N/A )  Patient Location: PACU  Anesthesia Type:General  Level of Consciousness: awake and patient cooperative  Airway & Oxygen Therapy: Patient Spontanous Breathing and Patient connected to nasal cannula oxygen  Post-op Assessment: Report given to RN, Post -op Vital signs reviewed and stable and Patient moving all extremities  Post vital signs: Reviewed and stable  Last Vitals:  Vitals Value Taken Time  BP    Temp    Pulse    Resp    SpO2      Last Pain:  Vitals:   09/06/18 0745  TempSrc: Oral  PainSc: 0-No pain         Complications: No apparent anesthesia complications

## 2018-09-06 NOTE — Anesthesia Postprocedure Evaluation (Signed)
Anesthesia Post Note  Patient: Courtney Harvey  Procedure(s) Performed: DILATATION AND CURETTAGE /HYSTEROSCOPY  ENDOMETRIAL POLYPECTOMY (N/A )  Patient location during evaluation: Short Stay Anesthesia Type: General Level of consciousness: awake and alert and patient cooperative Pain management: satisfactory to patient Vital Signs Assessment: post-procedure vital signs reviewed and stable Respiratory status: spontaneous breathing Cardiovascular status: stable Postop Assessment: no apparent nausea or vomiting Anesthetic complications: no     Last Vitals:  Vitals:   09/06/18 1030 09/06/18 1044  BP: 105/80 108/78  Pulse: 69 66  Resp: 11 18  Temp:    SpO2: 92% 92%    Last Pain:  Vitals:   09/06/18 1044  TempSrc:   PainSc: 0-No pain                 Emira Eubanks

## 2018-09-06 NOTE — Op Note (Signed)
Preop diagnosis: Postmenopausal bleeding                              Endometrial polyp discovered in sonogram evaluation  Postop diagnosis: Same as above  Procedure: Hysteroscopic removal of endometrial polyp with uterine curettage  Surgeon:  Lazaro Arms, MD   Anesthesia: Laryngeal mask airway  Findings: And hysteroscopic evaluation today the patient was noted to have a broad-based polyp emanating from the left lateral uterine wall.  The remainder of the endometrium appeared to be smooth and benign This polyp had the appearance of also being benign  Description of operation: Patient was taken to the operating room where she was placed in the supine position and underwent laryngeal mask airway anesthesia She was placed in lithotomy position SHe was prepped and draped in usual sterile fashion A Graves speculum was placed in the anterior cervix was grasped with a single-tooth tenaculum The cervix was dilated serially using Hegar dilators The hysteroscope was placed into the uterine cavity with 1 pass that difficulty Above-noted findings were seen Randall stone forceps were used and the polyp was removed in fragments Repeat hysteroscopy was performed and revealed the entire polyp to be removed A vigorous uterine curettage was then performed These 2 specimens were sent separately There was no bleeding of the endometrium at the end of the case The patient tolerated the procedure well He experienced minimal blood loss She received Unasyn 3 g preoperatively due to her history of a porcine valve replacement Also received Toradol preoperatively She was taken to the recovery room in good stable condition all counts were correct  To be seen back in the office in 1 week She can expect a couple of weeks of watery blood-tinged vaginal discharge  Full photodocumentation was performed and placed in her chart digitally using haiku  Lazaro Arms, MD 09/06/2018 9:52 AM

## 2018-09-06 NOTE — H&P (Signed)
Preoperative History and Physical  Courtney Harvey is a 62 y.o. G2P2 with No LMP recorded (lmp unknown). Patient is postmenopausal. admitted for a hysterosco[py uterine curettage removal of endometrial polyp.   Chief Complaint  Patient presents with  . Follow-up    biopsy results and gyn u/s    Blood pressure 116/77, pulse 92, height 5' 6.5" (1.689 m), weight 185 lb (83.9 kg).  US Pelvis Transvanginal Non-ob (tv Only)  Result Date: 08/18/2018 GYNECOLOGIC SONOGRAM Courtney Harvey is a 62 y.o. G2P2 she is here for a pelvic sonogram for postmenopausal bleeding . Uterus                      5.9 x 2.7 x 4.8 cm, vol 39, homogenous retroverted uterus,wnl Endometrium          10.8 mm, symmetrical,complex thickened endometrium Right ovary             2 x 1.9 x 1 cm, small echogenic right ovarian mass w/posterior shadowing 5 x 4 x 4 mm Left ovary                2 x 1.2 x 1.6 cm, wnl No free fluid Technician Comments: PELVIC US TA/TV: homogenous retroverted uterus,wnl, complex thickened endometrium 10.8 mm,no color flow w/in the endometrium,normal left ovary,small echogenic right ovarian mass w/posterior shadowing 5 x 4 x 4 mm,ovaries appear mobile,no free fluid,no pain during ultrasound [employer] 08/18/2018 11:34 AM Clinical Impression and recommendations: I have reviewed the sonogram results above, combined with the patient's current clinical course, below are my impressions and any appropriate recommendations for management based on the sonographic findings. Uterus is normal Endometrium is pathologic, complex and thickened, biopsy reveals polyp Ovaries are normal, probably very small dermoid in the right ovary, not clinically significant Courtney Harvey 08/18/2018 11:39 AM   US Pelvis (transabdominal Only)  Result Date: 08/18/2018 GYNECOLOGIC SONOGRAM Courtney Harvey is a 62 y.o. G2P2 she is here for a pelvic sonogram for postmenopausal bleeding . Uterus                      5.9 x 2.7 x 4.8  cm, vol 39, homogenous retroverted uterus,wnl Endometrium          10.8 mm, symmetrical,complex thickened endometrium Right ovary             2 x 1.9 x 1 cm, small echogenic right ovarian mass w/posterior shadowing 5 x 4 x 4 mm Left ovary                2 x 1.2 x 1.6 cm, wnl No free fluid Technician Comments: PELVIC US TA/TV: homogenous retroverted uterus,wnl, complex thickened endometrium 10.8 mm,no color flow w/in the endometrium,normal left ovary,small echogenic right ovarian mass w/posterior shadowing 5 x 4 x 4 mm,ovaries appear mobile,no free fluid,no pain during ultrasound [employer] 08/18/2018 11:34 AM Clinical Impression and recommendations: I have reviewed the sonogram results above, combined with the patient's current clinical course, below are my impressions and any appropriate recommendations for management based on the sonographic findings. Uterus is normal Endometrium is pathologic, complex and thickened, biopsy reveals polyp Ovaries are normal, probably very small dermoid in the right ovary, not clinically significant Courtney Harvey 08/18/2018 11:39 AM      MEDS ordered this encounter: No orders of the defined types were placed in this encounter.   Orders for this encounter: No orders of the defined  types were placed in this encounter.   Impression: Endometrial polyp  PMB (postmenopausal bleeding)  Thickened endometrium  S/P aortic valve replacement    Plan: Pt has endometrial biopsy showing endometrial polyp and sonogram is thickened heterogenous so needs a formal evluation with hysteroscopy and uterine curettage  09/06/2018  Pt has hisotry of AVR up to date reviewed and appears to not recommend prophylaxis for GU procedures, however, since having a uterine curettage I feel clinically still indicated, will use 2 grams amoxicillin 60 minutes prior to surgery    PMH:    Past Medical History:  Diagnosis Date  . Aortic insufficiency and aortic stenosis     AR/AS by echo/cath 3/013 [name]) s/p tissue AVR 01/26/12 (EF 55-60%, no CAD by cath at that time)  . Chronic diastolic heart failure (HCC)   . Essential hypertension   . Glaucoma   . H/O hiatal hernia   . History of cardiac catheterization    Normal coronaries, March 2012  . History of kidney stones   . PONV (postoperative nausea and vomiting)   . Recurrent upper respiratory infection (URI)   . S/P aortic valve replacement 01/26/2012   21mm [facility] Ease pericardial tissue valve  . S/P patent foramen ovale closure 01/26/2012   PFO found on TEE prior to AVR. Performed at the time of aortic valve replacement    PSH:     Past Surgical History:  Procedure Laterality Date  . AORTIC VALVE REPLACEMENT  01/26/2012   Procedure: AORTIC VALVE REPLACEMENT (AVR);  Surgeon: Purcell Nails, MD;  Location: [facility] OR;  Service: Open Heart Surgery;  Laterality: N/A;  . CARDIAC CATHETERIZATION  2005 and 2013   Friday March 8th       pcp Dr tapper  in [place]       . LEFT AND RIGHT HEART CATHETERIZATION WITH CORONARY ANGIOGRAM N/A 01/07/2012   Procedure: LEFT AND RIGHT HEART CATHETERIZATION WITH CORONARY ANGIOGRAM;  Surgeon: Wendall Stade, MD;  Location: [facility] CATH LAB;  Service: Cardiovascular;  Laterality: N/A;  . TEE WITHOUT CARDIOVERSION  01/10/2012   Procedure: TRANSESOPHAGEAL ECHOCARDIOGRAM (TEE);  Surgeon: Pricilla Riffle, MD;  Location: [facility] ENDOSCOPY;  Service: Cardiovascular;  Laterality: N/A;  TEE call Trish in am for time    POb/GynH:      OB History    Gravida  2   Para  2   Term      Preterm      AB      Living  2     SAB      TAB      Ectopic      Multiple      Live Births              SH:   Social History   Tobacco Use  . Smoking status: Former Smoker    Packs/day: 1.00    Years: 40.00    Pack years: 40.00    Types: Cigarettes    Last attempt to quit: 01/06/2012    Years since quitting: 6.6  . Smokeless tobacco: Never Used  Substance Use Topics  . Alcohol use:  No    Alcohol/week: 0.0 standard drinks  . Drug use: No    FH:    Family History  Problem Relation Age of Onset  . Lung cancer Father   . Uterine cancer Mother   . Hypertension Brother   . Cancer Sister   . Hypertension Son  Allergies:  Allergies  Allergen Reactions  . Peanut-Containing Drug Products Other (See Comments)    headache  . Wellbutrin [Bupropion Hcl] Itching    Medications:       Current Facility-Administered Medications:  .  Ampicillin-Sulbactam (UNASYN) 3 g in sodium chloride 0.9 % 100 mL IVPB, 3 g, Intravenous, On Call to OR, Courtney Arms, MD  Review of Systems:   Review of Systems  Constitutional: Negative for fever, chills, weight loss, malaise/fatigue and diaphoresis.  HENT: Negative for hearing loss, ear pain, nosebleeds, congestion, sore throat, neck pain, tinnitus and ear discharge.   Eyes: Negative for blurred vision, double vision, photophobia, pain, discharge and redness.  Respiratory: Negative for cough, hemoptysis, sputum production, shortness of breath, wheezing and stridor.   Cardiovascular: Negative for chest pain, palpitations, orthopnea, claudication, leg swelling and PND.  Gastrointestinal: Positive for abdominal pain. Negative for heartburn, nausea, vomiting, diarrhea, constipation, blood in stool and melena.  Genitourinary: Negative for dysuria, urgency, frequency, hematuria and flank pain.  Musculoskeletal: Negative for myalgias, back pain, joint pain and falls.  Skin: Negative for itching and rash.  Neurological: Negative for dizziness, tingling, tremors, sensory change, speech change, focal weakness, seizures, loss of consciousness, weakness and headaches.  Endo/Heme/Allergies: Negative for environmental allergies and polydipsia. Does not bruise/bleed easily.  Psychiatric/Behavioral: Negative for depression, suicidal ideas, hallucinations, memory loss and substance abuse. The patient is not nervous/anxious and does not have  insomnia.      PHYSICAL EXAM:  Blood pressure 118/87, pulse 79, temperature 98.1 F (36.7 C), temperature source Oral, height 5\' 6"  (1.676 m), weight 83.5 kg, SpO2 94 %.    Vitals reviewed. Constitutional: She is oriented to person, place, and time. She appears well-developed and well-nourished.  HENT:  Head: Normocephalic and atraumatic.  Right Ear: External ear normal.  Left Ear: External ear normal.  Nose: Nose normal.  Mouth/Throat: Oropharynx is clear and moist.  Eyes: Conjunctivae and EOM are normal. Pupils are equal, round, and reactive to light. Right eye exhibits no discharge. Left eye exhibits no discharge. No scleral icterus.  Neck: Normal range of motion. Neck supple. No tracheal deviation present. No thyromegaly present.  Cardiovascular: Normal rate, regular rhythm, normal heart sounds and intact distal pulses.  Exam reveals no gallop and no friction rub.   No murmur heard. Respiratory: Effort normal and breath sounds normal. No respiratory distress. She has no wheezes. She has no rales. She exhibits no tenderness.  GI: Soft. Bowel sounds are normal. She exhibits no distension and no mass. There is tenderness. There is no rebound and no guarding.  Genitourinary:       Vulva is normal without lesions Vagina is pink moist without discharge Cervix normal in appearance and pap is normal Uterus is normal size, contour, position, consistency, mobility, non-tender Adnexa is negative with normal sized ovaries by sonogram  Musculoskeletal: Normal range of motion. She exhibits no edema and no tenderness.  Neurological: She is alert and oriented to person, place, and time. She has normal reflexes. She displays normal reflexes. No cranial nerve deficit. She exhibits normal muscle tone. Coordination normal.  Skin: Skin is warm and dry. No rash noted. No erythema. No pallor.  Psychiatric: She has a normal mood and affect. Her behavior is normal. Judgment and thought content normal.     Labs: Results for orders placed or performed during the hospital encounter of 08/31/18 (from the past 336 hour(s))  CBC   Collection Time: 08/31/18  2:15 PM  Result Value Ref  Range   WBC 8.4 4.0 - 10.5 K/uL   RBC 4.31 3.87 - 5.11 MIL/uL   Hemoglobin 13.4 12.0 - 15.0 g/dL   HCT 16.1 09.6 - 04.5 %   MCV 93.0 80.0 - 100.0 fL   MCH 31.1 26.0 - 34.0 pg   MCHC 33.4 30.0 - 36.0 g/dL   RDW 40.9 81.1 - 91.4 %   Platelets 220 150 - 400 K/uL   nRBC 0.0 0.0 - 0.2 %  Comprehensive metabolic panel   Collection Time: 08/31/18  2:15 PM  Result Value Ref Range   Sodium 137 135 - 145 mmol/L   Potassium 3.0 (L) 3.5 - 5.1 mmol/L   Chloride 102 98 - 111 mmol/L   CO2 28 22 - 32 mmol/L   Glucose, Bld 92 70 - 99 mg/dL   BUN 21 8 - 23 mg/dL   Creatinine, Ser 7.82 0.44 - 1.00 mg/dL   Calcium 9.2 8.9 - 95.6 mg/dL   Total Protein 7.6 6.5 - 8.1 g/dL   Albumin 3.7 3.5 - 5.0 g/dL   AST 53 (H) 15 - 41 U/L   ALT 63 (H) 0 - 44 U/L   Alkaline Phosphatase 69 38 - 126 U/L   Total Bilirubin 0.8 0.3 - 1.2 mg/dL   GFR calc non Af Amer >60 >60 mL/min   GFR calc Af Amer >60 >60 mL/min   Anion gap 7 5 - 15  Rapid HIV screen (HIV 1/2 Ab+Ag)   Collection Time: 08/31/18  2:15 PM  Result Value Ref Range   HIV-1 P24 Antigen - HIV24 NON REACTIVE NON REACTIVE   HIV 1/2 Antibodies NON REACTIVE NON REACTIVE   Interpretation (HIV Ag Ab)      A non reactive test result means that HIV 1 or HIV 2 antibodies and HIV 1 p24 antigen were not detected in the specimen.  Urinalysis, Routine w reflex microscopic   Collection Time: 08/31/18  2:15 PM  Result Value Ref Range   Color, Urine YELLOW YELLOW   APPearance CLEAR CLEAR   Specific Gravity, Urine 1.018 1.005 - 1.030   pH 6.0 5.0 - 8.0   Glucose, UA NEGATIVE NEGATIVE mg/dL   Hgb urine dipstick NEGATIVE NEGATIVE   Bilirubin Urine NEGATIVE NEGATIVE   Ketones, ur NEGATIVE NEGATIVE mg/dL   Protein, ur NEGATIVE NEGATIVE mg/dL   Nitrite NEGATIVE NEGATIVE   Leukocytes, UA  TRACE (A) NEGATIVE   RBC / HPF 0-5 0 - 5 RBC/hpf   WBC, UA 0-5 0 - 5 WBC/hpf   Bacteria, UA NONE SEEN NONE SEEN   Squamous Epithelial / LPF 0-5 0 - 5    EKG: Orders placed or performed in visit on 04/13/18  . EKG 12-Lead    Imaging Studies: US Pelvis Transvanginal Non-ob (tv Only)  Result Date: 08/18/2018 GYNECOLOGIC SONOGRAM Courtney Harvey is a 62 y.o. G2P2 she is here for a pelvic sonogram for postmenopausal bleeding . Uterus                      5.9 x 2.7 x 4.8 cm, vol 39, homogenous retroverted uterus,wnl Endometrium          10.8 mm, symmetrical,complex thickened endometrium Right ovary             2 x 1.9 x 1 cm, small echogenic right ovarian mass w/posterior shadowing 5 x 4 x 4 mm Left ovary  2 x 1.2 x 1.6 cm, wnl No free fluid Technician Comments: PELVIC US TA/TV: homogenous retroverted uterus,wnl, complex thickened endometrium 10.8 mm,no color flow w/in the endometrium,normal left ovary,small echogenic right ovarian mass w/posterior shadowing 5 x 4 x 4 mm,ovaries appear mobile,no free fluid,no pain during ultrasound [employer] 08/18/2018 11:34 AM Clinical Impression and recommendations: I have reviewed the sonogram results above, combined with the patient's current clinical course, below are my impressions and any appropriate recommendations for management based on the sonographic findings. Uterus is normal Endometrium is pathologic, complex and thickened, biopsy reveals polyp Ovaries are normal, probably very small dermoid in the right ovary, not clinically significant Courtney Harvey 08/18/2018 11:39 AM   US Pelvis (transabdominal Only)  Result Date: 08/18/2018 GYNECOLOGIC SONOGRAM Courtney Harvey is a 62 y.o. G2P2 she is here for a pelvic sonogram for postmenopausal bleeding . Uterus                      5.9 x 2.7 x 4.8 cm, vol 39, homogenous retroverted uterus,wnl Endometrium          10.8 mm, symmetrical,complex thickened endometrium Right ovary             2 x 1.9  x 1 cm, small echogenic right ovarian mass w/posterior shadowing 5 x 4 x 4 mm Left ovary                2 x 1.2 x 1.6 cm, wnl No free fluid Technician Comments: PELVIC US TA/TV: homogenous retroverted uterus,wnl, complex thickened endometrium 10.8 mm,no color flow w/in the endometrium,normal left ovary,small echogenic right ovarian mass w/posterior shadowing 5 x 4 x 4 mm,ovaries appear mobile,no free fluid,no pain during ultrasound [employer] 08/18/2018 11:34 AM Clinical Impression and recommendations: I have reviewed the sonogram results above, combined with the patient's current clinical course, below are my impressions and any appropriate recommendations for management based on the sonographic findings. Uterus is normal Endometrium is pathologic, complex and thickened, biopsy reveals polyp Ovaries are normal, probably very small dermoid in the right ovary, not clinically significant Courtney Harvey 08/18/2018 11:39 AM      Assessment:  PMB due to endometrial polyp  Patient Active Problem List   Diagnosis Date Noted  . H/O hiatal hernia   . Aortic insufficiency with aortic stenosis   . Ejection fraction   . Recurrent upper respiratory infection (URI)   . Chronic diastolic heart failure (HCC)   . Chest pain syndrome   . S/P aortic valve replacement 01/26/2012  . S/P patent foramen ovale closure 01/26/2012  . Hypertension 01/11/2012  . Tobacco abuse 01/11/2012    Plan: Hysteroscopic removal of endometrial polyp  Courtney Harvey 09/06/2018 8:34 AM

## 2018-09-06 NOTE — Discharge Instructions (Signed)
Dilation and Curettage or Vacuum Curettage, Care After  These instructions give you information about caring for yourself after your procedure. Your doctor may also give you more specific instructions. Call your doctor if you have any problems or questions after your procedure.  Follow these instructions at home:  Activity   · Do not drive or use heavy machinery while taking prescription pain medicine.  · For 24 hours after your procedure, avoid driving.  · Take short walks often, followed by rest periods. Ask your doctor what activities are safe for you. After one or two days, you may be able to return to your normal activities.  · Do not lift anything that is heavier than 10 lb (4.5 kg) until your doctor approves.  · For at least 2 weeks, or as long as told by your doctor:  ? Do not douche.  ? Do not use tampons.  ? Do not have sex.  General instructions   · Take over-the-counter and prescription medicines only as told by your doctor. This is very important if you take blood thinning medicine.  · Do not take baths, swim, or use a hot tub until your doctor approves. Take showers instead of baths.  · Wear compression stockings as told by your doctor.  · It is up to you to get the results of your procedure. Ask your doctor when your results will be ready.  · Keep all follow-up visits as told by your doctor. This is important.  Contact a doctor if:  · You have very bad cramps that get worse or do not get better with medicine.  · You have very bad pain in your belly (abdomen).  · You cannot drink fluids without throwing up (vomiting).  · You get pain in a different part of the area between your belly and thighs (pelvis).  · You have bad-smelling discharge from your vagina.  · You have a rash.  Get help right away if:  · You are bleeding a lot from your vagina. A lot of bleeding means soaking more than one sanitary pad in an hour, for 2 hours in a row.  · You have clumps of blood (blood clots) coming from your  vagina.  · You have a fever or chills.  · Your belly feels very tender or hard.  · You have chest pain.  · You have trouble breathing.  · You cough up blood.  · You feel dizzy.  · You feel light-headed.  · You pass out (faint).  · You have pain in your neck or shoulder area.  Summary  · Take short walks often, followed by rest periods. Ask your doctor what activities are safe for you. After one or two days, you may be able to return to your normal activities.  · Do not lift anything that is heavier than 10 lb (4.5 kg) until your doctor approves.  · Do not take baths, swim, or use a hot tub until your doctor approves. Take showers instead of baths.  · Contact your doctor if you have any symptoms of infection, like bad-smelling discharge from your vagina.  This information is not intended to replace advice given to you by your health care provider. Make sure you discuss any questions you have with your health care provider.  Document Released: 07/27/2008 Document Revised: 07/05/2016 Document Reviewed: 07/05/2016  Elsevier Interactive Patient Education © 2017 [employer]

## 2018-09-06 NOTE — Anesthesia Preprocedure Evaluation (Signed)
Anesthesia Evaluation  Patient identified by MRN, date of birth, ID band Patient awake    Reviewed: Allergy & Precautions, H&P , NPO status , Patient's Chart, lab work & pertinent test results  History of Anesthesia Complications (+) PONV and history of anesthetic complications  Airway Mallampati: II  TM Distance: >3 FB Neck ROM: full    Dental no notable dental hx.    Pulmonary Recent URI , former smoker,    Pulmonary exam normal breath sounds clear to auscultation       Cardiovascular Exercise Tolerance: Good hypertension, negative cardio ROS   Rhythm:regular Rate:Normal     Neuro/Psych negative neurological ROS  negative psych ROS   GI/Hepatic Neg liver ROS, hiatal hernia,   Endo/Other  negative endocrine ROS  Renal/GU negative Renal ROS  negative genitourinary   Musculoskeletal   Abdominal   Peds  Hematology negative hematology ROS (+)   Anesthesia Other Findings   Reproductive/Obstetrics negative OB ROS                             Anesthesia Physical Anesthesia Plan  ASA: III  Anesthesia Plan: General   Post-op Pain Management:    Induction:   PONV Risk Score and Plan:   Airway Management Planned:   Additional Equipment:   Intra-op Plan:   Post-operative Plan:   Informed Consent: I have reviewed the patients History and Physical, chart, labs and discussed the procedure including the risks, benefits and alternatives for the proposed anesthesia with the patient or authorized representative who has indicated his/her understanding and acceptance.   Dental Advisory Given  Plan Discussed with: CRNA  Anesthesia Plan Comments:         Anesthesia Quick Evaluation

## 2018-09-07 ENCOUNTER — Encounter (HOSPITAL_COMMUNITY): Payer: Self-pay | Admitting: Obstetrics & Gynecology

## 2018-09-14 ENCOUNTER — Other Ambulatory Visit: Payer: Self-pay

## 2018-09-14 ENCOUNTER — Encounter: Payer: Self-pay | Admitting: Obstetrics & Gynecology

## 2018-09-14 ENCOUNTER — Ambulatory Visit (INDEPENDENT_AMBULATORY_CARE_PROVIDER_SITE_OTHER): Payer: BLUE CROSS/BLUE SHIELD | Admitting: Obstetrics & Gynecology

## 2018-09-14 VITALS — BP 102/71 | HR 92 | Ht 66.5 in | Wt 182.0 lb

## 2018-09-14 DIAGNOSIS — Z9889 Other specified postprocedural states: Secondary | ICD-10-CM

## 2018-09-14 NOTE — Progress Notes (Signed)
  HPI: Patient returns for routine postoperative follow-up having undergone hysteroscopy D&C removal of polyps 09/06/2018 .  The patient's immediate postoperative recovery has been unremarkable. Since hospital discharge the patient reports no problems.   Current Outpatient Medications: cetirizine (ZYRTEC) 10 MG tablet, Take 10 mg by mouth daily., Disp: , Rfl:  diphenhydrAMINE-PE-APAP (RA SEVERE ALLERGY PLUS SINUS PO), Take 2 tablets by mouth 2 (two) times daily as needed (for congestion)., Disp: , Rfl:  fluticasone (FLONASE) 50 MCG/ACT nasal spray, Place 1 spray into both nostrils daily as needed for allergies or rhinitis., Disp: , Rfl:  ondansetron (ZOFRAN ODT) 8 MG disintegrating tablet, Take 1 tablet (8 mg total) by mouth every 8 (eight) hours as needed for nausea or vomiting., Disp: 20 tablet, Rfl: 0 triamterene-hydrochlorothiazide (MAXZIDE-25) 37.5-25 MG tablet, Take 1 tablet by mouth daily., Disp: , Rfl: 1 ketorolac (TORADOL) 10 MG tablet, Take 1 tablet (10 mg total) by mouth every 8 (eight) hours as needed. (Patient not taking: Reported on 09/14/2018), Disp: 15 tablet, Rfl: 0 meclizine (ANTIVERT) 25 MG tablet, Take 25 mg by mouth 3 (three) times daily as needed for dizziness., Disp: , Rfl:  naproxen sodium (ALEVE) 220 MG tablet, Take 440 mg by mouth daily as needed (for pain or headache)., Disp: , Rfl:   No current facility-administered medications for this visit.     Blood pressure 102/71, pulse 92, height 5' 6.5" (1.689 m), weight 182 lb (82.6 kg).  Physical Exam: Normal post D&C  Diagnostic Tests:   Pathology: Benign polyps  Impression: S/p hysteroscopy uterine curettage removal of polyps, unremarkable post op course  Plan:   Follow up: prn    Lazaro ArmsLuther H Antionetta Ator, MD

## 2018-11-01 DIAGNOSIS — T8859XA Other complications of anesthesia, initial encounter: Secondary | ICD-10-CM

## 2018-11-01 HISTORY — DX: Other complications of anesthesia, initial encounter: T88.59XA

## 2019-04-17 ENCOUNTER — Telehealth: Payer: Self-pay | Admitting: Cardiology

## 2019-04-17 NOTE — Telephone Encounter (Signed)
Virtual Visit Pre-Appointment Phone Call  "(Name), I am calling you today to discuss your upcoming appointment. We are currently trying to limit exposure to the virus that causes COVID-19 by seeing patients at home rather than in the office."  1. "What is the BEST phone number to call the day of the visit?" - include this in appointment notes  2. Do you have or have access to (through a family member/friend) a smartphone with video capability that we can use for your visit?" a. If yes - list this number in appt notes as cell (if different from BEST phone #) and list the appointment type as a VIDEO visit in appointment notes b. If no - list the appointment type as a PHONE visit in appointment notes  3. Confirm consent - "In the setting of the current Covid19 crisis, you are scheduled for a (phone or video) visit with your provider on (date) at (time).  Just as we do with many in-office visits, in order for you to participate in this visit, we must obtain consent.  If you'd like, I can send this to your mychart (if signed up) or email for you to review.  Otherwise, I can obtain your verbal consent now.  All virtual visits are billed to your insurance company just like a normal visit would be.  By agreeing to a virtual visit, we'd like you to understand that the technology does not allow for your provider to perform an examination, and thus may limit your provider's ability to fully assess your condition. If your provider identifies any concerns that need to be evaluated in person, we will make arrangements to do so.  Finally, though the technology is pretty good, we cannot assure that it will always work on either your or our end, and in the setting of a video visit, we may have to convert it to a phone-only visit.  In either situation, we cannot ensure that we have a secure connection.  Are you willing to proceed?" STAFF: Did the patient verbally acknowledge consent to telehealth visit? Document  YES/NO here: yes  4. Advise patient to be prepared - "Two hours prior to your appointment, go ahead and check your blood pressure, pulse, oxygen saturation, and your weight (if you have the equipment to check those) and write them all down. When your visit starts, your provider will ask you for this information. If you have an Apple Watch or Kardia device, please plan to have heart rate information ready on the day of your appointment. Please have a pen and paper handy nearby the day of the visit as well."  5. Give patient instructions for MyChart download to smartphone OR Doximity/Doxy.me as below if video visit (depending on what platform provider is using)  6. Inform patient they will receive a phone call 15 minutes prior to their appointment time (may be from unknown caller ID) so they should be prepared to answer    TELEPHONE CALL NOTE  Courtney Harvey has been deemed a candidate for a follow-up tele-health visit to limit community exposure during the Covid-19 pandemic. I spoke with the patient via phone to ensure availability of phone/video source, confirm preferred email & phone number, and discuss instructions and expectations.  I reminded Courtney Harvey to be prepared with any vital sign and/or heart rhythm information that could potentially be obtained via home monitoring, at the time of her visit. I reminded Courtney Harvey to expect a phone call prior to  her visit.  Courtney Harvey 04/17/2019 12:22 PM   INSTRUCTIONS FOR DOWNLOADING THE MYCHART APP TO SMARTPHONE  - The patient must first make sure to have activated MyChart and know their login information - If Apple, go to [employer] and type in MyChart in the search bar and download the app. If Android, ask patient to go to [employer] and type in [facility] in the search bar and download the app. The app is free but as with any other app downloads, their phone may require them to verify saved payment information or  Apple/Android password.  - The patient will need to then log into the app with their MyChart username and password, and select [facility] as their healthcare provider to link the account. When it is time for your visit, go to the MyChart app, find appointments, and click Begin Video Visit. Be sure to Select Allow for your device to access the Microphone and Camera for your visit. You will then be connected, and your provider will be with you shortly.  **If they have any issues connecting, or need assistance please contact MyChart service desk [phone])**  **If using a computer, in order to ensure the best quality for their visit they will need to use either of the following Internet Browsers: [employer], or Google Chrome**  IF USING DOXIMITY or DOXY.ME - The patient will receive a link just prior to their visit by text.     FULL LENGTH CONSENT FOR TELE-HEALTH VISIT   I hereby voluntarily request, consent and authorize [facility] and its employed or contracted physicians, physician assistants, nurse practitioners or other licensed health care professionals (the Practitioner), to provide me with telemedicine health care services (the Services") as deemed necessary by the treating Practitioner. I acknowledge and consent to receive the Services by the Practitioner via telemedicine. I understand that the telemedicine visit will involve communicating with the Practitioner through live audiovisual communication technology and the disclosure of certain medical information by electronic transmission. I acknowledge that I have been given the opportunity to request an in-person assessment or other available alternative prior to the telemedicine visit and am voluntarily participating in the telemedicine visit.  I understand that I have the right to withhold or withdraw my consent to the use of telemedicine in the course of my care at any time, without affecting my right to future care  or treatment, and that the Practitioner or I may terminate the telemedicine visit at any time. I understand that I have the right to inspect all information obtained and/or recorded in the course of the telemedicine visit and may receive copies of available information for a reasonable fee.  I understand that some of the potential risks of receiving the Services via telemedicine include:   Delay or interruption in medical evaluation due to technological equipment failure or disruption;  Information transmitted may not be sufficient (e.g. poor resolution of images) to allow for appropriate medical decision making by the Practitioner; and/or   In rare instances, security protocols could fail, causing a breach of personal health information.  Furthermore, I acknowledge that it is my responsibility to provide information about my medical history, conditions and care that is complete and accurate to the best of my ability. I acknowledge that Practitioner's advice, recommendations, and/or decision may be based on factors not within their control, such as incomplete or inaccurate data provided by me or distortions of diagnostic images or specimens that may result from electronic transmissions. I  understand that the practice of medicine is not an exact science and that Practitioner makes no warranties or guarantees regarding treatment outcomes. I acknowledge that I will receive a copy of this consent concurrently upon execution via email to the email address I last provided but may also request a printed copy by calling the office of [facility].    I understand that my insurance will be billed for this visit.   I have read or had this consent read to me.  I understand the contents of this consent, which adequately explains the benefits and risks of the Services being provided via telemedicine.   I have been provided ample opportunity to ask questions regarding this consent and the Services and have had  my questions answered to my satisfaction.  I give my informed consent for the services to be provided through the use of telemedicine in my medical care  By participating in this telemedicine visit I agree to the above.

## 2019-04-20 ENCOUNTER — Encounter: Payer: Self-pay | Admitting: Cardiology

## 2019-04-20 ENCOUNTER — Telehealth (INDEPENDENT_AMBULATORY_CARE_PROVIDER_SITE_OTHER): Payer: PRIVATE HEALTH INSURANCE | Admitting: Cardiology

## 2019-04-20 VITALS — Ht 66.5 in

## 2019-04-20 DIAGNOSIS — I10 Essential (primary) hypertension: Secondary | ICD-10-CM

## 2019-04-20 DIAGNOSIS — Z953 Presence of xenogenic heart valve: Secondary | ICD-10-CM

## 2019-04-20 DIAGNOSIS — Z7189 Other specified counseling: Secondary | ICD-10-CM | POA: Diagnosis not present

## 2019-04-20 DIAGNOSIS — I5032 Chronic diastolic (congestive) heart failure: Secondary | ICD-10-CM

## 2019-04-20 NOTE — Patient Instructions (Addendum)

## 2019-04-20 NOTE — Progress Notes (Signed)
Virtual Visit via Telephone Note   This visit type was conducted due to national recommendations for restrictions regarding the COVID-19 Pandemic (e.g. social distancing) in an effort to limit this patient's exposure and mitigate transmission in our community.  Due to her co-morbid illnesses, this patient is at least at moderate risk for complications without adequate follow up.  This format is felt to be most appropriate for this patient at this time.  The patient did not have access to video technology/had technical difficulties with video requiring transitioning to audio format only (telephone).  All issues noted in this document were discussed and addressed.  No physical exam could be performed with this format.  Please refer to the patient's chart for her  consent to telehealth for [facility].   Date:  04/20/2019   ID:  Courtney SiasPeggy D Haynes, DOB [birth year], MRN [id number]  Patient Location: Home Provider Location: Office  PCP:  Gwenlyn FudgeJoyce, Britney F, FNP  Cardiologist:  Nona DellSamuel Esthela Brandner, MD Electrophysiologist:  None   Evaluation Performed:  Follow-Up Visit  Chief Complaint:   Cardiac follow-up  History of Present Illness:    Courtney Siaseggy D Harvey is a 63 y.o. female last seen in June 2019.  She did not have video access and we spoke by phone today.  Not report any significant angina symptoms, no shortness of breath beyond NYHA class II, no palpitations or syncope.  She has been staying around her house during the COVID-19 pandemic, but does the get out in her yard.  Echocardiogram in July 2019 revealed LVEF 60 to 65% with mild diastolic dysfunction and normal bioprosthetic AVR function with mean gradient 15 mmHg.  Discussed the results today.  We went over her medications which are outlined below and stable.  She is due to see her PCP back in the next few months for a physical with lab work.  The patient does not have symptoms concerning for COVID-19 infection (fever, chills, cough, or new  shortness of breath).    Past Medical History:  Diagnosis Date  . Aortic insufficiency and aortic stenosis    AR/AS by echo/cath 3/013 [name]([place]) s/p tissue AVR 01/26/12 (EF 55-60%, no CAD by cath at that time)  . Chronic diastolic heart failure (HCC)   . Essential hypertension   . Glaucoma   . H/O hiatal hernia   . History of cardiac catheterization    Normal coronaries, March 2012  . History of kidney stones   . PONV (postoperative nausea and vomiting)   . Recurrent upper respiratory infection (URI)   . S/P aortic valve replacement 01/26/2012   [facility]mm [facility] pericardial tissue valve  . S/P patent foramen ovale closure 01/26/2012   PFO found on TEE prior to AVR. Performed at the time of aortic valve replacement   Past Surgical History:  Procedure Laterality Date  . AORTIC VALVE REPLACEMENT  01/26/2012   Procedure: AORTIC VALVE REPLACEMENT (AVR);  Surgeon: Purcell Nailslarence H Owen, MD;  [facility];  Service: Open Heart Surgery;  Laterality: N/A;  . CARDIAC CATHETERIZATION  2005 and 2013   Friday March 8th       [place]       . HYSTEROSCOPY W/D&C N/A 09/06/2018   Procedure: DILATATION AND CURETTAGE /HYSTEROSCOPY  ENDOMETRIAL POLYPECTOMY;  Surgeon: Lazaro ArmsEure, Luther H, MD;  Location: [facility];  Service: Gynecology;  Laterality: N/A;  . LEFT AND RIGHT HEART CATHETERIZATION WITH CORONARY ANGIOGRAM N/A 01/07/2012   Procedure: LEFT AND RIGHT HEART CATHETERIZATION  WITH CORONARY ANGIOGRAM;  Surgeon: Wendall StadePeter C Nishan, MD;  [facility];  Service: Cardiovascular;  Laterality: N/A;  . TEE WITHOUT CARDIOVERSION  01/10/2012   Procedure: TRANSESOPHAGEAL ECHOCARDIOGRAM (TEE);  Surgeon: Pricilla RifflePaula V Ross, MD;  [facility];  Service: Cardiovascular;  Laterality: N/A;  TEE call Trish in am for time     Current Meds  Medication Sig  . cetirizine (ZYRTEC) 10 MG tablet Take 10 mg by mouth daily.  . diphenhydrAMINE-PE-APAP (RA SEVERE ALLERGY PLUS SINUS PO) Take 2 tablets by mouth 2  (two) times daily as needed (for congestion).  . fluticasone (FLONASE) 50 MCG/ACT nasal spray Place 1 spray into both nostrils daily as needed for allergies or rhinitis.  Marland Kitchen. meclizine (ANTIVERT) 25 MG tablet Take 25 mg by mouth 3 (three) times daily as needed for dizziness.  . naproxen sodium (ALEVE) 220 MG tablet Take 440 mg by mouth daily as needed (for pain or headache).  . ondansetron (ZOFRAN ODT) 8 MG disintegrating tablet Take 1 tablet (8 mg total) by mouth every 8 (eight) hours as needed for nausea or vomiting.  . triamterene-hydrochlorothiazide (MAXZIDE-25) 37.5-25 MG tablet Take 1 tablet by mouth daily.     Allergies:   Peanut-containing drug products and Wellbutrin [bupropion hcl]   Social History   Tobacco Use  . Smoking status: Former Smoker    Packs/day: 1.00    Years: 40.00    Pack years: 40.00    Types: Cigarettes    Quit date: 01/06/2012    Years since quitting: 7.2  . Smokeless tobacco: Never Used  Substance Use Topics  . Alcohol use: No    Alcohol/week: 0.0 standard drinks  . Drug use: No     Family Hx: The patient's family history includes Cancer in her sister; Hypertension in her brother and son; Lung cancer in her father; Uterine cancer in her mother.  ROS:   Please see the history of present illness. All other systems reviewed and are negative.   Prior CV studies:   The following studies were reviewed today:  Echocardiogram 05/17/2018: Study Conclusions  - Left ventricle: The cavity size was normal. Wall thickness was   normal. Systolic function was normal. The estimated ejection   fraction was in the range of 60% to 65%. Wall motion was normal;   there were no regional wall motion abnormalities. Doppler   parameters are consistent with abnormal left ventricular   relaxation (grade 1 diastolic dysfunction). Doppler parameters   are consistent with indeterminate ventricular filling pressure. - Aortic valve: Normally functioning bioprosthetic valve.  Mean   gradient (S): 15 mm Hg. Peak gradient (S): 27 mm Hg. - Atrial septum: No defect or patent foramen ovale was identified. - Tricuspid valve: There was mild regurgitation.  Labs/Other Tests and Data Reviewed:    EKG:  An ECG dated 04/13/2018 was personally reviewed today and demonstrated:  Sinus rhythm with R' in lead V1 and V2, nonspecific T wave changes.  Recent Labs: 08/31/2018: ALT 63; BUN 21; Creatinine, Ser 0.92; Hemoglobin 13.4; Platelets 220; Potassium 3.0; Sodium 137    Wt Readings from Last 3 Encounters:  09/14/18 182 lb (82.6 kg)  09/06/18 184 lb (83.5 kg)  08/31/18 184 lb (83.5 kg)     Objective:    Vital Signs:  Ht 5' 6.5" (1.689 m)   LMP  (LMP Unknown)   BMI 28.94 kg/m    She did not have a way to check vital signs today. Patient spoke in full sentences,  not short of breath. No audible wheezing or coughing. Speech pattern normal.  ASSESSMENT & PLAN:    1.  Stenosis and regurgitation status post bioprosthetic AVR in 2013.  Valve function normal by echocardiogram in June 2019.  She is symptomatically stable and we will continue with observation for now.  2.  Diastolic heart failure by history.  She reports no increasing shortness of breath, her weights have been stable, and she has had no leg swelling.  3.  Essential hypertension, on Dyazide.  COVID-19 Education: The signs and symptoms of COVID-19 were discussed with the patient and how to seek care for testing (follow up with PCP or arrange E-visit).  The importance of social distancing was discussed today.  Time:   Today, I have spent 5 minutes with the patient with telehealth technology discussing the above problems.     Medication Adjustments/Labs and Tests Ordered: Current medicines are reviewed at length with the patient today.  Concerns regarding medicines are outlined above.   Tests Ordered: No orders of the defined types were placed in this encounter.   Medication Changes: No orders of the  defined types were placed in this encounter.   Follow Up:  In Person 1 year in the [place] office.  Signed, Rozann Lesches, MD  04/20/2019 10:10 AM    [place]

## 2020-04-14 NOTE — Progress Notes (Signed)
Cardiology Office Note  Date: 04/15/2020   ID: Courtney Harvey, DOB [birth year], MRN [id number]  PCP:  Patient, No Pcp Per  Cardiologist:  Rozann Lesches, MD Electrophysiologist:  None   Chief Complaint: F/U Hx of Aortic valve replacement  History of Present Illness: Courtney Harvey is a 64 y.o. female with a history of aortic valve replacement, chronic diastolic heart failure, hypertension.  Last encounter 04/20/2019 via telemedicine with Dr. Domenic Polite.  She did not report any significant anginal symptoms or shortness of breath beyond NYHA class II.  Echo 2019 showed EF 60 to 65% with mild DD and normal bioprosthetic AVR function mean gradient of 15 mmHg.  Patient is here for 1 year follow-up.  Denies any recent acute illnesses, hospitalizations.  States she sometimes has some the left pain in her left axillary breast area which feels like it may be below her ribs.  States it is not associated with exertion.  Denies any radiation to neck, arm, back or jaw.  Also states she has some occasional chest pressure but is not necessarily associated with exertion.  She states when this occurs she finds it hard to take a deep breath.  She states none of this is associated with exertion and she denies any associated nausea, vomiting, or diaphoresis.  Denies any orthostatic symptoms, CVA or TIA-like symptoms, palpitations or arrhythmias, bleeding in stool or urine.  Denies any PND, orthopnea.  Denies any LE edema.   Past Medical History:  Diagnosis Date  . Aortic insufficiency and aortic stenosis    AR/AS by echo/cath 3/013 Lovie Macadamia) s/p tissue AVR 01/26/12 (EF 55-60%, no CAD by cath at that time)  . Chronic diastolic heart failure ([place])   . Essential hypertension   . Glaucoma   . H/O hiatal hernia   . History of cardiac catheterization    Normal coronaries, March 2012  . History of kidney stones   . PONV (postoperative nausea and vomiting)   . Recurrent upper respiratory infection (URI)   .  S/P aortic valve replacement 01/26/2012   81mm [facility] Ease pericardial tissue valve  . S/P patent foramen ovale closure 01/26/2012   PFO found on TEE prior to AVR. Performed at the time of aortic valve replacement    Past Surgical History:  Procedure Laterality Date  . AORTIC VALVE REPLACEMENT  01/26/2012   Procedure: AORTIC VALVE REPLACEMENT (AVR);  Surgeon: Rexene Alberts, MD;  Location: [place];  Service: Open Heart Surgery;  Laterality: N/A;  . CARDIAC CATHETERIZATION  2005 and 2013   Friday March 8th       pcp Dr tapper  in [place]       . HYSTEROSCOPY WITH D & C N/A 09/06/2018   Procedure: DILATATION AND CURETTAGE /HYSTEROSCOPY  ENDOMETRIAL POLYPECTOMY;  Surgeon: Florian Buff, MD;  Location[facility];  Service: Gynecology;  Laterality: N/A;  . LEFT AND RIGHT HEART CATHETERIZATION WITH CORONARY ANGIOGRAM N/A 01/07/2012   Procedure: LEFT AND RIGHT HEART CATHETERIZATION WITH CORONARY ANGIOGRAM;  Surgeon: Josue Hector, MD;  Location: [facility] CATH LAB;  Service: Cardiovascular;  Laterality: N/A;  . TEE WITHOUT CARDIOVERSION  01/10/2012   Procedure: TRANSESOPHAGEAL ECHOCARDIOGRAM (TEE);  Surgeon: Fay Records, MD;  Location: [facility] ENDOSCOPY;  Service: Cardiovascular;  Laterality: N/A;  TEE call Trish in am for time    Current Outpatient Medications  Medication Sig Dispense Refill  . cetirizine (ZYRTEC) 10 MG tablet Take 10 mg by mouth daily.    . diphenhydrAMINE-PE-APAP (  RA SEVERE ALLERGY PLUS SINUS PO) Take 2 tablets by mouth 2 (two) times daily as needed (for congestion).    . fluticasone (FLONASE) 50 MCG/ACT nasal spray Place 1 spray into both nostrils daily as needed for allergies or rhinitis.    Marland Kitchen meclizine (ANTIVERT) 25 MG tablet Take 25 mg by mouth 3 (three) times daily as needed for dizziness.    . naproxen sodium (ALEVE) 220 MG tablet Take 440 mg by mouth daily as needed (for pain or headache).    . ondansetron (ZOFRAN ODT) 8 MG disintegrating tablet Take 1 tablet (8 mg total) by mouth  every 8 (eight) hours as needed for nausea or vomiting. 20 tablet 0  . triamterene-hydrochlorothiazide (MAXZIDE-25) 37.5-25 MG tablet Take 1 tablet by mouth daily.  1   No current facility-administered medications for this visit.   Allergies:  Peanut-containing drug products and Wellbutrin [bupropion hcl]   Social History: The patient  reports that she quit smoking about 8 years ago. Her smoking use included cigarettes. She has a 40.00 pack-year smoking history. She has never used smokeless tobacco. She reports that she does not drink alcohol and does not use drugs.   Family History: The patient's family history includes Cancer in her sister; Hypertension in her brother and son; Lung cancer in her father; Uterine cancer in her mother.   ROS:  Please see the history of present illness. Otherwise, complete review of systems is positive for none.  All other systems are reviewed and negative.   Physical Exam: VS:  BP 106/76   Pulse 80   Ht 5' 6.5" (1.689 m)   Wt 180 lb 6.4 oz (81.8 kg)   LMP  (LMP Unknown)   SpO2 98%   BMI 28.68 kg/m , BMI Body mass index is 28.68 kg/m.  Wt Readings from Last 3 Encounters:  04/15/20 180 lb 6.4 oz (81.8 kg)  09/14/18 182 lb (82.6 kg)  09/06/18 184 lb (83.5 kg)    General: Patient appears comfortable at rest. Neck: Supple, no elevated JVP or carotid bruits, no thyromegaly. Lungs: Clear to auscultation, nonlabored breathing at rest. Cardiac: Regular rate and rhythm, no S3, audible systolic murmur heard best at right lower sternal border.  2/6.  No pericardial rub. Extremities: No pitting edema, distal pulses 2+. Skin: Warm and dry. Musculoskeletal: No kyphosis. Neuropsychiatric: Alert and oriented x3, affect grossly appropriate.  ECG:  An ECG dated 04/15/2020 was personally reviewed today and demonstrated:  Sinus rhythm with rate of 90 RSR V1 nondiagnostic, nonspecific ST depression plus nonspecific T abnormality  Recent Labwork: No results found for  requested labs within last 8760 hours.  No results found for: CHOL, TRIG, HDL, CHOLHDL, VLDL, LDLCALC, LDLDIRECT  Other Studies Reviewed Today:  Echocardiogram 05/17/2018: Study Conclusions  - Left ventricle: The cavity size was normal. Wall thickness was normal. Systolic function was normal. The estimated ejection fraction was in the range of 60% to 65%. Wall motion was normal; there were no regional wall motion abnormalities. Doppler parameters are consistent with abnormal left ventricular relaxation (grade 1 diastolic dysfunction). Doppler parameters are consistent with indeterminate ventricular filling pressure. - Aortic valve: Normally functioning bioprosthetic valve. Mean gradient (S): 15 mm Hg. Peak gradient (S): 27 mm Hg. - Atrial septum: No defect or patent foramen ovale was identified. - Tricuspid valve: There was mild regurgitation  Assessment and Plan:  1. History of aortic valve replacement with bioprosthetic valve   2. Chronic diastolic heart failure (HCC)   3. Essential hypertension  1. History of aortic valve replacement with bioprosthetic valve Hx of AVR 2013 with stenosis/regurgitation. Echo 2019 Normal valve function.  Has an audible systolic murmur 2/6 in severity.  States she does have some issues with shortness of breath not necessarily associated with activity.  Please schedule a repeat echocardiogram to assess LV function, valvular disease.  2. Chronic diastolic heart failure (HCC) Last echo in 2019 showed grade 1 diastolic dysfunction with EF of 60 to 65%.  We are repeating echo to assess valvular function as noted above.  Patient states she has some increased dyspnea not necessarily associated with exertion.  3. Essential hypertension Blood pressure is well controlled on current medical therapy.  Continue triamterene/hydrochlorothiazide 37.5/25 mg p.o. daily.  Medication Adjustments/Labs and Tests Ordered: Current medicines are reviewed  at length with the patient today.  Concerns regarding medicines are outlined above.   Disposition: Follow-up with Dr. Diona Browner or APP 1 year  Signed, Rennis Harding, NP 04/15/2020 10:06 AM    [facility] at [facility] [address] [place], [place], [place] [address] Phone: [phone]; Fax: [phone]

## 2020-04-15 ENCOUNTER — Ambulatory Visit (INDEPENDENT_AMBULATORY_CARE_PROVIDER_SITE_OTHER): Payer: 59 | Admitting: Family Medicine

## 2020-04-15 ENCOUNTER — Encounter: Payer: Self-pay | Admitting: Family Medicine

## 2020-04-15 ENCOUNTER — Other Ambulatory Visit: Payer: Self-pay

## 2020-04-15 ENCOUNTER — Other Ambulatory Visit: Payer: Self-pay | Admitting: Family Medicine

## 2020-04-15 VITALS — BP 106/76 | HR 80 | Ht 66.5 in | Wt 180.4 lb

## 2020-04-15 DIAGNOSIS — I5032 Chronic diastolic (congestive) heart failure: Secondary | ICD-10-CM | POA: Diagnosis not present

## 2020-04-15 DIAGNOSIS — I10 Essential (primary) hypertension: Secondary | ICD-10-CM

## 2020-04-15 DIAGNOSIS — Z953 Presence of xenogenic heart valve: Secondary | ICD-10-CM

## 2020-04-15 DIAGNOSIS — I359 Nonrheumatic aortic valve disorder, unspecified: Secondary | ICD-10-CM

## 2020-04-15 NOTE — Patient Instructions (Addendum)
Medication Instructions:   Your physician recommends that you continue on your current medications as directed. Please refer to the Current Medication list given to you today.  Labwork:  NONE  Testing/Procedures: Your physician has requested that you have an echocardiogram. Echocardiography is a painless test that uses sound waves to create images of your heart. It provides your doctor with information about the size and shape of your heart and how well your heart's chambers and valves are working. This procedure takes approximately one hour. There are no restrictions for this procedure.  Follow-Up:  Your physician recommends that you schedule a follow-up appointment in: 1 year (office). You will receive a reminder call in about 10 months reminding you to call and schedule your appointment. If you don't receive this letter, please contact our office.  Any Other Special Instructions Will Be Listed Below (If Applicable).  If you need a refill on your cardiac medications before your next appointment, please call your pharmacy.

## 2020-05-01 ENCOUNTER — Ambulatory Visit (INDEPENDENT_AMBULATORY_CARE_PROVIDER_SITE_OTHER): Payer: 59

## 2020-05-01 ENCOUNTER — Other Ambulatory Visit: Payer: Self-pay

## 2020-05-01 DIAGNOSIS — I359 Nonrheumatic aortic valve disorder, unspecified: Secondary | ICD-10-CM

## 2020-05-01 DIAGNOSIS — I5032 Chronic diastolic (congestive) heart failure: Secondary | ICD-10-CM | POA: Diagnosis not present

## 2020-05-06 ENCOUNTER — Telehealth: Payer: Self-pay | Admitting: *Deleted

## 2020-05-06 NOTE — Telephone Encounter (Signed)
-----   Message from Netta Neat., NP sent at 05/01/2020  4:32 PM EDT ----- Please call the patient and let her know the pumping function in her heart looks good.  She had some very minor leakage in her mitral valve.  Her prosthetic aortic valve looks good.  Thank you

## 2020-05-07 NOTE — Telephone Encounter (Signed)
Patient informed. Copy sent to PCP °

## 2021-09-03 ENCOUNTER — Ambulatory Visit: Payer: 59 | Admitting: Cardiology

## 2021-09-06 NOTE — H&P (View-Only) (Signed)
Cardiology Office Note  Date: 09/07/2021   ID: Courtney Harvey, DOB 1956-05-11, MRN [id number]  PCP:  Suzan Slick, MD  Cardiologist:  Nona Dell, MD Electrophysiologist:  None   Chief Complaint: 1 year follow-up  History of Present Illness: Courtney Harvey is a 65 y.o. female with a history of HTN, chronic diastolic heart failure, aortic insufficiency and aortic stenosis status post AVR and PFO closure 01/26/2012, tobacco abuse.   Previously seen on 04/20/2019 via telemedicine with Dr. Diona Browner.  She did not report any significant anginal symptoms or shortness of breath beyond NYHA class II.  Echo 2019 showed EF 60 to 65% with mild DD and normal bioprosthetic AVR function mean gradient of 15 mmHg.   She is here today for 1 year follow-up.  She states she has been having some issues with increasing shortness of breath with exertion more than usual.  Also complains of chest discomfort/chest pressure with exertional activities relieved with rest.  States she has been having some issues with hot flash like symptoms and dizziness when waking up when lying in bed.  She states the dizziness does not become worse with sitting or standing.  She does have a history of diabetes with recent hemoglobin A1c of 6.8%.  She does have a history of vertigo on meclizine.  She denies any palpitations.  She states she does have occasional symptoms with feeling of dizziness or as if she may pass out.  She denies any CVA or TIA-like symptoms, bleeding, claudication-like symptoms, DVT or PE-like symptoms, lower extremity edema.       Past Medical History:  Diagnosis Date   Aortic insufficiency and aortic stenosis    AR/AS by echo/cath 3/013 [name]) s/p tissue AVR 01/26/12 (EF 55-60%, no CAD by cath at that time)   Chronic diastolic heart failure [facility])    Essential hypertension    Glaucoma    H/O hiatal hernia    History of cardiac catheterization    Normal coronaries, March 2012   History of  kidney stones    PONV (postoperative nausea and vomiting)    Recurrent upper respiratory infection (URI)    S/P aortic valve replacement 01/26/2012   3mm [facility] Ease pericardial tissue valve   S/P patent foramen ovale closure 01/26/2012   PFO found on TEE prior to AVR. Performed at the time of aortic valve replacement    Past Surgical History:  Procedure Laterality Date   AORTIC VALVE REPLACEMENT  01/26/2012   Procedure: AORTIC VALVE REPLACEMENT (AVR);  Surgeon: Purcell Nails, MD;  Location: [facility] OR;  Service: Open Heart Surgery;  Laterality: N/A;   CARDIAC CATHETERIZATION  2005 and 2013   Friday March 8th       pcp Dr tapper  in [place]        HYSTEROSCOPY WITH D & C N/A 09/06/2018   Procedure: DILATATION AND CURETTAGE /HYSTEROSCOPY  ENDOMETRIAL POLYPECTOMY;  Surgeon: Lazaro Arms, MD;  Location[facility];  Service: Gynecology;  Laterality: N/A;   LEFT AND RIGHT HEART CATHETERIZATION WITH CORONARY ANGIOGRAM N/A 01/07/2012   Procedure: LEFT AND RIGHT HEART CATHETERIZATION WITH CORONARY ANGIOGRAM;  Surgeon: Wendall Stade, MD;  Location: [facility] CATH LAB;  Service: Cardiovascular;  Laterality: N/A;   TEE WITHOUT CARDIOVERSION  01/10/2012   Procedure: TRANSESOPHAGEAL ECHOCARDIOGRAM (TEE);  Surgeon: Pricilla Riffle, MD;  Location: [facility] ENDOSCOPY;  Service: Cardiovascular;  Laterality: N/A;  TEE call Trish in am for time    Current Outpatient Medications  Medication Sig Dispense Refill   cetirizine (ZYRTEC) 10 MG tablet Take 10 mg by mouth daily.     diphenhydrAMINE-PE-APAP (RA SEVERE ALLERGY PLUS SINUS PO) Take 2 tablets by mouth 2 (two) times daily as needed (for congestion).     fluticasone (FLONASE) 50 MCG/ACT nasal spray Place 1 spray into both nostrils daily as needed for allergies or rhinitis.     metFORMIN (GLUCOPHAGE-XR) 500 MG 24 hr tablet Take 500 mg by mouth daily.     naproxen sodium (ALEVE) 220 MG tablet Take 440 mg by mouth daily as needed (for pain or headache).      triamterene-hydrochlorothiazide (MAXZIDE-25) 37.5-25 MG tablet Take 1 tablet by mouth daily.  1   No current facility-administered medications for this visit.   Allergies:  Peanut-containing drug products and Wellbutrin [bupropion hcl]   Social History: The patient  reports that she quit smoking about 9 years ago. Her smoking use included cigarettes. She has a 40.00 pack-year smoking history. She has never used smokeless tobacco. She reports that she does not drink alcohol and does not use drugs.   Family History: The patient's family history includes Cancer in her sister; Hypertension in her brother and son; Lung cancer in her father; Uterine cancer in her mother.   ROS:  Please see the history of present illness. Otherwise, complete review of systems is positive for none.  All other systems are reviewed and negative.   Physical Exam: VS:  BP 110/78   Pulse 72   Ht 5' 6.5" (1.689 m)   Wt 173 lb (78.5 kg)   LMP  (LMP Unknown)   BMI 27.50 kg/m , BMI Body mass index is 27.5 kg/m.  Wt Readings from Last 3 Encounters:  09/07/21 173 lb (78.5 kg)  04/15/20 180 lb 6.4 oz (81.8 kg)  09/14/18 182 lb (82.6 kg)    General: Patient appears comfortable at rest. Neck: Supple, no elevated JVP or carotid bruits, no thyromegaly. Lungs: Clear to auscultation, nonlabored breathing at rest. Cardiac: Regular rate and rhythm, no S3 or significant systolic murmur, no pericardial rub. Extremities: No pitting edema, distal pulses 2+. Skin: Warm and dry. Musculoskeletal: No kyphosis. Neuropsychiatric: Alert and oriented x3, affect grossly appropriate.  ECG: 09/07/2021 normal sinus rhythm rate of 72, ST and T wave abnormality consider anterolateral ischemia.  Recent Labwork: No results found for requested labs within last 8760 hours.  No results found for: CHOL, TRIG, HDL, CHOLHDL, VLDL, LDLCALC, LDLDIRECT  Other Studies Reviewed Today:  Echocardiogram 05/01/2020  1. Left ventricular ejection  fraction, by estimation, is 65 to 70%. The  left ventricle has normal function. The left ventricle has no regional  wall motion abnormalities. Left ventricular diastolic parameters are  indeterminate.   2. Right ventricular systolic function is normal. The right ventricular  size is normal. There is normal pulmonary artery systolic pressure. The  estimated right ventricular systolic pressure is 16.2 mmHg.   3. The mitral valve is grossly normal. Trivial mitral valve  regurgitation.   4. The aortic valve has been repaired/replaced. Aortic valve  regurgitation is not visualized. There is a 21 mm [employer] valve  present in the aortic position. Aortic valve mean gradient measures 19.5  mmHg. No paravalvular regurgitation. Gross  valve function normal with some increase in gradient compared to prior  study in 2019.   5. The inferior vena cava is normal in size with greater than 50%  respiratory variability, suggesting right atrial pressure of 3 mmHg.  Echocardiogram 05/17/2018: Study Conclusions   - Left ventricle: The cavity size was normal. Wall thickness was   normal. Systolic function was normal. The estimated ejection   fraction was in the range of 60% to 65%. Wall motion was normal;   there were no regional wall motion abnormalities. Doppler   parameters are consistent with abnormal left ventricular   relaxation (grade 1 diastolic dysfunction). Doppler parameters   are consistent with indeterminate ventricular filling pressure. - Aortic valve: Normally functioning bioprosthetic valve. Mean   gradient (S): 15 mm Hg. Peak gradient (S): 27 mm Hg. - Atrial septum: No defect or patent foramen ovale was identified. - Tricuspid valve: There was mild regurgitation  Assessment and Plan:  1. History of aortic valve replacement with bioprosthetic valve   2. Chronic diastolic heart failure (HCC)   3. Essential hypertension   4. Chest discomfort   5. Hot flashes   6.  Dizziness    1. History of aortic valve replacement with bioprosthetic valve Hx of AVR 2013 with stenosis/regurgitation. Echo 2019 Normal valve function.  Has an audible systolic murmur 2/6 in severity.  States she does have some issues with shortness of breath not necessarily associated with activity.  Please schedule a repeat echocardiogram to assess LV function, valvular disease.   2. Chronic diastolic heart failure (HCC) Last echo in 2019 showed grade 1 diastolic dysfunction with EF of 60 to 65%.  We are repeating echo to assess valvular function as noted above.  She states she has been experiencing increasing DOE and SOB.  States it seems to have progressed over the last year.  Please get a follow-up echocardiogram.   3. Essential hypertension Blood pressure is well controlled on current medical therapy.  BP today 110/78.  Continue triamterene/hydrochlorothiazide 37.5/25 mg p.o. daily.  4.  Chest discomfort/chest pressure Complaining of chest discomfort/chest tightness with activity which is new for her.  She states the discomfort is relieved at rest.  Please get a Lexiscan stress test.  5.  Hot flash-like symptoms, dizziness She states she has been having hot flashes like symptoms and dizziness recently.  She states she has not had lab work recently.  She is unsure if she has had thyroid function test recently.  Please get TSH, T3, T4.  She denies any associated palpitations that she can since.    Medication Adjustments/Labs and Tests Ordered: Current medicines are reviewed at length with the patient today.  Concerns regarding medicines are outlined above.   Disposition: Follow-up with Dr. Diona Browner or APP 3 months  Signed, Rennis Harding, NP 09/07/2021 9:25 AM    [facility] at [facility] [address] [place], [place], [place] [address] Phone: [phone]; Fax: [phone]

## 2021-09-06 NOTE — H&P (View-Only) (Signed)
  Cardiology Office Note  Date: 09/07/2021   ID: Alvah D Domanski, DOB 06/07/1956, MRN [id number]  PCP:  Rucker, Alethea Y, MD  Cardiologist:  Samuel McDowell, MD Electrophysiologist:  None   Chief Complaint: 1 year follow-up  History of Present Illness: Courtney Harvey is a 65 y.o. female with a history of HTN, chronic diastolic heart failure, aortic insufficiency and aortic stenosis status post AVR and PFO closure 01/26/2012, tobacco abuse.   Previously seen on 04/20/2019 via telemedicine with Dr. McDowell.  She did not report any significant anginal symptoms or shortness of breath beyond NYHA class II.  Echo 2019 showed EF 60 to 65% with mild DD and normal bioprosthetic AVR function mean gradient of 15 mmHg.   She is here today for 1 year follow-up.  She states she has been having some issues with increasing shortness of breath with exertion more than usual.  Also complains of chest discomfort/chest pressure with exertional activities relieved with rest.  States she has been having some issues with hot flash like symptoms and dizziness when waking up when lying in bed.  She states the dizziness does not become worse with sitting or standing.  She does have a history of diabetes with recent hemoglobin A1c of 6.8%.  She does have a history of vertigo on meclizine.  She denies any palpitations.  She states she does have occasional symptoms with feeling of dizziness or as if she may pass out.  She denies any CVA or TIA-like symptoms, bleeding, claudication-like symptoms, DVT or PE-like symptoms, lower extremity edema.       Past Medical History:  Diagnosis Date   Aortic insufficiency and aortic stenosis    AR/AS by echo/cath 3/013 ([facility]) s/p tissue AVR 01/26/12 (EF 55-60%, no CAD by cath at that time)   Chronic diastolic heart failure (HCC)    Essential hypertension    Glaucoma    H/O hiatal hernia    History of cardiac catheterization    Normal coronaries, March 2012   History of  kidney stones    PONV (postoperative nausea and vomiting)    Recurrent upper respiratory infection (URI)    S/P aortic valve replacement 01/26/2012   21mm Edwards Magna Ease pericardial tissue valve   S/P patent foramen ovale closure 01/26/2012   PFO found on TEE prior to AVR. Performed at the time of aortic valve replacement    Past Surgical History:  Procedure Laterality Date   AORTIC VALVE REPLACEMENT  01/26/2012   Procedure: AORTIC VALVE REPLACEMENT (AVR);  Surgeon: Clarence H Owen, MD;  Location: [facility];  Service: Open Heart Surgery;  Laterality: N/A;   CARDIAC CATHETERIZATION  2005 and 2013   Friday March 8th       pcp Dr tapper  in [place]        HYSTEROSCOPY WITH D & C N/A 09/06/2018   Procedure: DILATATION AND CURETTAGE /HYSTEROSCOPY  ENDOMETRIAL POLYPECTOMY;  Surgeon: Eure, Luther H, MD;  Location: [facility];  Service: Gynecology;  Laterality: N/A;   LEFT AND RIGHT HEART CATHETERIZATION WITH CORONARY ANGIOGRAM N/A 01/07/2012   Procedure: LEFT AND RIGHT HEART CATHETERIZATION WITH CORONARY ANGIOGRAM;  Surgeon: Peter C Nishan, MD;  Location: [facility] CATH [facility];  Service: Cardiovascular;  Laterality: N/A;   TEE WITHOUT CARDIOVERSION  01/10/2012   Procedure: TRANSESOPHAGEAL ECHOCARDIOGRAM (TEE);  Surgeon: Paula V Ross, MD;  Location: [facility];  Service: Cardiovascular;  Laterality: N/A;  TEE call Trish in am for time    Current Outpatient Medications    Medication Sig Dispense Refill   cetirizine (ZYRTEC) 10 MG tablet Take 10 mg by mouth daily.     diphenhydrAMINE-PE-APAP (RA SEVERE ALLERGY PLUS SINUS PO) Take 2 tablets by mouth 2 (two) times daily as needed (for congestion).     fluticasone (FLONASE) 50 MCG/ACT nasal spray Place 1 spray into both nostrils daily as needed for allergies or rhinitis.     metFORMIN (GLUCOPHAGE-XR) 500 MG 24 hr tablet Take 500 mg by mouth daily.     naproxen sodium (ALEVE) 220 MG tablet Take 440 mg by mouth daily as needed (for pain or headache).      triamterene-hydrochlorothiazide (MAXZIDE-25) 37.5-25 MG tablet Take 1 tablet by mouth daily.  1   No current facility-administered medications for this visit.   Allergies:  Peanut-containing drug products and Wellbutrin [bupropion hcl]   Social History: The patient  reports that she quit smoking about 9 years ago. Her smoking use included cigarettes. She has a 40.00 pack-year smoking history. She has never used smokeless tobacco. She reports that she does not drink alcohol and does not use drugs.   Family History: The patient's family history includes Cancer in her sister; Hypertension in her brother and son; Lung cancer in her father; Uterine cancer in her mother.   ROS:  Please see the history of present illness. Otherwise, complete review of systems is positive for none.  All other systems are reviewed and negative.   Physical Exam: VS:  BP 110/78   Pulse 72   Ht 5' 6.5" (1.689 m)   Wt 173 lb (78.5 kg)   LMP  (LMP Unknown)   BMI 27.50 kg/m , BMI Body mass index is 27.5 kg/m.  Wt Readings from Last 3 Encounters:  09/07/21 173 lb (78.5 kg)  04/15/20 180 lb 6.4 oz (81.8 kg)  09/14/18 182 lb (82.6 kg)    General: Patient appears comfortable at rest. Neck: Supple, no elevated JVP or carotid bruits, no thyromegaly. Lungs: Clear to auscultation, nonlabored breathing at rest. Cardiac: Regular rate and rhythm, no S3 or significant systolic murmur, no pericardial rub. Extremities: No pitting edema, distal pulses 2+. Skin: Warm and dry. Musculoskeletal: No kyphosis. Neuropsychiatric: Alert and oriented x3, affect grossly appropriate.  ECG: 09/07/2021 normal sinus rhythm rate of 72, ST and T wave abnormality consider anterolateral ischemia.  Recent Labwork: No results found for requested labs within last 8760 hours.  No results found for: CHOL, TRIG, HDL, CHOLHDL, VLDL, LDLCALC, LDLDIRECT  Other Studies Reviewed Today:  Echocardiogram 05/01/2020  1. Left ventricular ejection  fraction, by estimation, is 65 to 70%. The  left ventricle has normal function. The left ventricle has no regional  wall motion abnormalities. Left ventricular diastolic parameters are  indeterminate.   2. Right ventricular systolic function is normal. The right ventricular  size is normal. There is normal pulmonary artery systolic pressure. The  estimated right ventricular systolic pressure is 16.2 mmHg.   3. The mitral valve is grossly normal. Trivial mitral valve  regurgitation.   4. The aortic valve has been repaired/replaced. Aortic valve  regurgitation is not visualized. There is a 21 mm Edwards Magna Ease valve  present in the aortic position. Aortic valve mean gradient measures 19.5  mmHg. No paravalvular regurgitation. Gross  valve function normal with some increase in gradient compared to prior  study in 2019.   5. The inferior vena cava is normal in size with greater than 50%  respiratory variability, suggesting right atrial pressure of 3 mmHg.        Echocardiogram 05/17/2018: Study Conclusions   - Left ventricle: The cavity size was normal. Wall thickness was   normal. Systolic function was normal. The estimated ejection   fraction was in the range of 60% to 65%. Wall motion was normal;   there were no regional wall motion abnormalities. Doppler   parameters are consistent with abnormal left ventricular   relaxation (grade 1 diastolic dysfunction). Doppler parameters   are consistent with indeterminate ventricular filling pressure. - Aortic valve: Normally functioning bioprosthetic valve. Mean   gradient (S): 15 mm Hg. Peak gradient (S): 27 mm Hg. - Atrial septum: No defect or patent foramen ovale was identified. - Tricuspid valve: There was mild regurgitation  Assessment and Plan:  1. History of aortic valve replacement with bioprosthetic valve   2. Chronic diastolic heart failure (HCC)   3. Essential hypertension   4. Chest discomfort   5. Hot flashes   6.  Dizziness    1. History of aortic valve replacement with bioprosthetic valve Hx of AVR 2013 with stenosis/regurgitation. Echo 2019 Normal valve function.  Has an audible systolic murmur 2/6 in severity.  States she does have some issues with shortness of breath not necessarily associated with activity.  Please schedule a repeat echocardiogram to assess LV function, valvular disease.   2. Chronic diastolic heart failure (HCC) Last echo in 2019 showed grade 1 diastolic dysfunction with EF of 60 to 65%.  We are repeating echo to assess valvular function as noted above.  She states she has been experiencing increasing DOE and SOB.  States it seems to have progressed over the last year.  Please get a follow-up echocardiogram.   3. Essential hypertension Blood pressure is well controlled on current medical therapy.  BP today 110/78.  Continue triamterene/hydrochlorothiazide 37.5/25 mg p.o. daily.  4.  Chest discomfort/chest pressure Complaining of chest discomfort/chest tightness with activity which is new for her.  She states the discomfort is relieved at rest.  Please get a Lexiscan stress test.  5.  Hot flash-like symptoms, dizziness She states she has been having hot flashes like symptoms and dizziness recently.  She states she has not had lab work recently.  She is unsure if she has had thyroid function test recently.  Please get TSH, T3, T4.  She denies any associated palpitations that she can since.    Medication Adjustments/Labs and Tests Ordered: Current medicines are reviewed at length with the patient today.  Concerns regarding medicines are outlined above.   Disposition: Follow-up with Dr. Diona Browner or APP 3 months  Signed, Rennis Harding, NP 09/07/2021 9:25 AM    [facility] [address] [place], [place], [place] [address] Phone: [phone]; Fax: [phone]

## 2021-09-06 NOTE — Progress Notes (Signed)
  Cardiology Office Note  Date: 09/07/2021   ID: Courtney Harvey, DOB 08/01/1956, MRN [id number]  PCP:  Rucker, Alethea Y, MD  Cardiologist:  Samuel McDowell, MD Electrophysiologist:  None   Chief Complaint: 1 year follow-up  History of Present Illness: Courtney Harvey is a 65 y.o. female with a history of HTN, chronic diastolic heart failure, aortic insufficiency and aortic stenosis status post AVR and PFO closure 01/26/2012, tobacco abuse.   Previously seen on 04/20/2019 via telemedicine with Dr. McDowell.  She did not report any significant anginal symptoms or shortness of breath beyond NYHA class II.  Echo 2019 showed EF 60 to 65% with mild DD and normal bioprosthetic AVR function mean gradient of 15 mmHg.   She is here today for 1 year follow-up.  She states she has been having some issues with increasing shortness of breath with exertion more than usual.  Also complains of chest discomfort/chest pressure with exertional activities relieved with rest.  States she has been having some issues with hot flash like symptoms and dizziness when waking up when lying in bed.  She states the dizziness does not become worse with sitting or standing.  She does have a history of diabetes with recent hemoglobin A1c of 6.8%.  She does have a history of vertigo on meclizine.  She denies any palpitations.  She states she does have occasional symptoms with feeling of dizziness or as if she may pass out.  She denies any CVA or TIA-like symptoms, bleeding, claudication-like symptoms, DVT or PE-like symptoms, lower extremity edema.       Past Medical History:  Diagnosis Date   Aortic insufficiency and aortic stenosis    AR/AS by echo/cath 3/013 ([facility]) s/p tissue AVR 01/26/12 (EF 55-60%, no CAD by cath at that time)   Chronic diastolic heart failure (HCC)    Essential hypertension    Glaucoma    H/O hiatal hernia    History of cardiac catheterization    Normal coronaries, March 2012   History of  kidney stones    PONV (postoperative nausea and vomiting)    Recurrent upper respiratory infection (URI)    S/P aortic valve replacement 01/26/2012   21mm Edwards Magna Ease pericardial tissue valve   S/P patent foramen ovale closure 01/26/2012   PFO found on TEE prior to AVR. Performed at the time of aortic valve replacement    Past Surgical History:  Procedure Laterality Date   AORTIC VALVE REPLACEMENT  01/26/2012   Procedure: AORTIC VALVE REPLACEMENT (AVR);  Surgeon: Clarence H Owen, MD;  Location: [facility];  Service: Open Heart Surgery;  Laterality: N/A;   CARDIAC CATHETERIZATION  2005 and 2013   Friday March 8th       pcp Dr tapper  in [place]        HYSTEROSCOPY WITH D & C N/A 09/06/2018   Procedure: DILATATION AND CURETTAGE /HYSTEROSCOPY  ENDOMETRIAL POLYPECTOMY;  Surgeon: Eure, Luther H, MD;  Location: [facility];  Service: Gynecology;  Laterality: N/A;   LEFT AND RIGHT HEART CATHETERIZATION WITH CORONARY ANGIOGRAM N/A 01/07/2012   Procedure: LEFT AND RIGHT HEART CATHETERIZATION WITH CORONARY ANGIOGRAM;  Surgeon: Peter C Nishan, MD;  Location: [facility] CATH [facility];  Service: Cardiovascular;  Laterality: N/A;   TEE WITHOUT CARDIOVERSION  01/10/2012   Procedure: TRANSESOPHAGEAL ECHOCARDIOGRAM (TEE);  Surgeon: Paula V Ross, MD;  Location: [facility] END[facility];  Service: Cardiovascular;  Laterality: N/A;  TEE call Trish in am for time    Current Outpatient Medications    Medication Sig Dispense Refill   cetirizine (ZYRTEC) 10 MG tablet Take 10 mg by mouth daily.     diphenhydrAMINE-PE-APAP (RA SEVERE ALLERGY PLUS SINUS PO) Take 2 tablets by mouth 2 (two) times daily as needed (for congestion).     fluticasone (FLONASE) 50 MCG/ACT nasal spray Place 1 spray into both nostrils daily as needed for allergies or rhinitis.     metFORMIN (GLUCOPHAGE-XR) 500 MG 24 hr tablet Take 500 mg by mouth daily.     naproxen sodium (ALEVE) 220 MG tablet Take 440 mg by mouth daily as needed (for pain or headache).      triamterene-hydrochlorothiazide (MAXZIDE-25) 37.5-25 MG tablet Take 1 tablet by mouth daily.  1   No current facility-administered medications for this visit.   Allergies:  Peanut-containing drug products and Wellbutrin [bupropion hcl]   Social History: The patient  reports that she quit smoking about 9 years ago. Her smoking use included cigarettes. She has a 40.00 pack-year smoking history. She has never used smokeless tobacco. She reports that she does not drink alcohol and does not use drugs.   Family History: The patient's family history includes Cancer in her sister; Hypertension in her brother and son; Lung cancer in her father; Uterine cancer in her mother.   ROS:  Please see the history of present illness. Otherwise, complete review of systems is positive for none.  All other systems are reviewed and negative.   Physical Exam: VS:  BP 110/78   Pulse 72   Ht 5' 6.5" (1.689 m)   Wt 173 lb (78.5 kg)   LMP  (LMP Unknown)   BMI 27.50 kg/m , BMI Body mass index is 27.5 kg/m.  Wt Readings from Last 3 Encounters:  09/07/21 173 lb (78.5 kg)  04/15/20 180 lb 6.4 oz (81.8 kg)  09/14/18 182 lb (82.6 kg)    General: Patient appears comfortable at rest. Neck: Supple, no elevated JVP or carotid bruits, no thyromegaly. Lungs: Clear to auscultation, nonlabored breathing at rest. Cardiac: Regular rate and rhythm, no S3 or significant systolic murmur, no pericardial rub. Extremities: No pitting edema, distal pulses 2+. Skin: Warm and dry. Musculoskeletal: No kyphosis. Neuropsychiatric: Alert and oriented x3, affect grossly appropriate.  ECG: 09/07/2021 normal sinus rhythm rate of 72, ST and T wave abnormality consider anterolateral ischemia.  Recent Labwork: No results found for requested labs within last 8760 hours.  No results found for: CHOL, TRIG, HDL, CHOLHDL, VLDL, LDLCALC, LDLDIRECT  Other Studies Reviewed Today:  Echocardiogram 05/01/2020  1. Left ventricular ejection  fraction, by estimation, is 65 to 70%. The  left ventricle has normal function. The left ventricle has no regional  wall motion abnormalities. Left ventricular diastolic parameters are  indeterminate.   2. Right ventricular systolic function is normal. The right ventricular  size is normal. There is normal pulmonary artery systolic pressure. The  estimated right ventricular systolic pressure is 16.2 mmHg.   3. The mitral valve is grossly normal. Trivial mitral valve  regurgitation.   4. The aortic valve has been repaired/replaced. Aortic valve  regurgitation is not visualized. There is a 21 mm Edwards Magna Ease valve  present in the aortic position. Aortic valve mean gradient measures 19.5  mmHg. No paravalvular regurgitation. Gross  valve function normal with some increase in gradient compared to prior  study in 2019.   5. The inferior vena cava is normal in size with greater than 50%  respiratory variability, suggesting right atrial pressure of 3 mmHg.        Echocardiogram 05/17/2018: Study Conclusions   - Left ventricle: The cavity size was normal. Wall thickness was   normal. Systolic function was normal. The estimated ejection   fraction was in the range of 60% to 65%. Wall motion was normal;   there were no regional wall motion abnormalities. Doppler   parameters are consistent with abnormal left ventricular   relaxation (grade 1 diastolic dysfunction). Doppler parameters   are consistent with indeterminate ventricular filling pressure. - Aortic valve: Normally functioning bioprosthetic valve. Mean   gradient (S): 15 mm Hg. Peak gradient (S): 27 mm Hg. - Atrial septum: No defect or patent foramen ovale was identified. - Tricuspid valve: There was mild regurgitation  Assessment and Plan:  1. History of aortic valve replacement with bioprosthetic valve   2. Chronic diastolic heart failure (HCC)   3. Essential hypertension   4. Chest discomfort   5. Hot flashes   6.  Dizziness    1. History of aortic valve replacement with bioprosthetic valve Hx of AVR 2013 with stenosis/regurgitation. Echo 2019 Normal valve function.  Has an audible systolic murmur 2/6 in severity.  States she does have some issues with shortness of breath not necessarily associated with activity.  Please schedule a repeat echocardiogram to assess LV function, valvular disease.   2. Chronic diastolic heart failure (HCC) Last echo in 2019 showed grade 1 diastolic dysfunction with EF of 60 to 65%.  We are repeating echo to assess valvular function as noted above.  She states she has been experiencing increasing DOE and SOB.  States it seems to have progressed over the last year.  Please get a follow-up echocardiogram.   3. Essential hypertension Blood pressure is well controlled on current medical therapy.  BP today 110/78.  Continue triamterene/hydrochlorothiazide 37.5/25 mg p.o. daily.  4.  Chest discomfort/chest pressure Complaining of chest discomfort/chest tightness with activity which is new for her.  She states the discomfort is relieved at rest.  Please get a Lexiscan stress test.  5.  Hot flash-like symptoms, dizziness She states she has been having hot flashes like symptoms and dizziness recently.  She states she has not had lab work recently.  She is unsure if she has had thyroid function test recently.  Please get TSH, T3, T4.  She denies any associated palpitations that she can since.    Medication Adjustments/Labs and Tests Ordered: Current medicines are reviewed at length with the patient today.  Concerns regarding medicines are outlined above.   Disposition: Follow-up with Dr. Diona Browner or APP 3 months  Signed, Rennis Harding, NP 09/07/2021 9:25 AM    [facility] at [facility] [address] [place], [place], [place] [address] Phone: [phone]; Fax: [phone]

## 2021-09-07 ENCOUNTER — Encounter: Payer: Self-pay | Admitting: Family Medicine

## 2021-09-07 ENCOUNTER — Ambulatory Visit: Payer: Medicare Other | Admitting: Family Medicine

## 2021-09-07 ENCOUNTER — Telehealth: Payer: Self-pay | Admitting: Family Medicine

## 2021-09-07 VITALS — BP 110/78 | HR 72 | Ht 66.5 in | Wt 173.0 lb

## 2021-09-07 DIAGNOSIS — I5032 Chronic diastolic (congestive) heart failure: Secondary | ICD-10-CM | POA: Diagnosis not present

## 2021-09-07 DIAGNOSIS — Z953 Presence of xenogenic heart valve: Secondary | ICD-10-CM

## 2021-09-07 DIAGNOSIS — R232 Flushing: Secondary | ICD-10-CM

## 2021-09-07 DIAGNOSIS — R0789 Other chest pain: Secondary | ICD-10-CM | POA: Diagnosis not present

## 2021-09-07 DIAGNOSIS — R0602 Shortness of breath: Secondary | ICD-10-CM

## 2021-09-07 DIAGNOSIS — I10 Essential (primary) hypertension: Secondary | ICD-10-CM

## 2021-09-07 DIAGNOSIS — R42 Dizziness and giddiness: Secondary | ICD-10-CM

## 2021-09-07 NOTE — Telephone Encounter (Signed)
Checking percert on the following patient for testing scheduled at [facility].     LEXISCAN   09/16/2021

## 2021-09-07 NOTE — Patient Instructions (Signed)
Medication Instructions:   Your physician recommends that you continue on your current medications as directed. Please refer to the Current Medication list given to you today.  *If you need a refill on your cardiac medications before your next appointment, please call your pharmacy*   Lab Work: RETURN TO [facility] LAB FOR  TSH  T3 T4   If you have labs (blood work) drawn today and your tests are completely normal, you will receive your results only by: MyChart Message (if you have MyChart) OR A paper copy in the mail If you have any lab test that is abnormal or we need to change your treatment, we will call you to review the results.   Testing/Procedures: Your physician has requested that you have an echocardiogram. Echocardiography is a painless test that uses sound waves to create images of your heart. It provides your doctor with information about the size and shape of your heart and how well your heart's chambers and valves are working. This procedure takes approximately one hour. There are no restrictions for this procedure.  Your physician has requested that you have a lexiscan myoview. For further information please visit [email or web address] Please follow instruction sheet, as given.   Follow-Up: At [facility], you and your health needs are our priority.  As part of our continuing mission to provide you with exceptional heart care, we have created designated Provider Care Teams.  These Care Teams include your primary Cardiologist (physician) and Advanced Practice Providers (APPs -  Physician Assistants and Nurse Practitioners) who all work together to provide you with the care you need, when you need it.  We recommend signing up for the patient portal called "MyChart".  Sign up information is provided on this After Visit Summary.  MyChart is used to connect with patients for Virtual Visits (Telemedicine).  Patients are able to view lab/test results, encounter notes,  upcoming appointments, etc.  Non-urgent messages can be sent to your provider as well.   To learn more about what you can do with MyChart, go to [email or web address].    Your next appointment:   3 month(s)  The format for your next appointment:   In Person  Provider:   You may see Nona Dell, MD or the following Advanced Practice Provider on your designated Care Team:   Nena Polio, [facility]   Other Instructions

## 2021-09-11 ENCOUNTER — Other Ambulatory Visit: Payer: Self-pay

## 2021-09-11 ENCOUNTER — Ambulatory Visit (INDEPENDENT_AMBULATORY_CARE_PROVIDER_SITE_OTHER): Payer: Medicare Other

## 2021-09-11 DIAGNOSIS — R0602 Shortness of breath: Secondary | ICD-10-CM | POA: Diagnosis not present

## 2021-09-11 LAB — ECHOCARDIOGRAM COMPLETE
AR max vel: 0.86 cm2
AV Area VTI: 0.83 cm2
AV Area mean vel: 0.83 cm2
AV Mean grad: 61 mmHg
AV Peak grad: 104.2 mmHg
Ao pk vel: 5.1 m/s
Area-P 1/2: 1.74 cm2
S' Lateral: 2.16 cm
Single Plane A2C EF: 52.9 %

## 2021-09-11 NOTE — Telephone Encounter (Signed)
-----   Message from Netta Neat., NP sent at 09/11/2021 12:44 PM EST ----- Please call the patient and let her know the echocardiogram showed the pumping function of the heart is good.  She has some increased muscular masking the main pumping chamber with some stiffness.  The key to managing this is to maintain blood pressure at or below 130/80 consistently and manage all other risk factors.  She has a mildly leaking mitral valve.  The prosthetic aortic valve is showing increased gradient across the valve which may indicate increasing prosthetic valve stenosis.  Dr. Diona Browner, who interpreted the test, wants her to have a TEE in [place] with 3D imaging to further evaluate the valve due to the significantly elevated gradients in the valve.  Please schedule a TEE with 3D imaging in [place].  Dr. Diona Browner states this may be the reason she is having increasing shortness of breath.  Thank you  Netta Neat, NP  09/11/2021 12:40 PM

## 2021-09-11 NOTE — Telephone Encounter (Signed)
Pt notified of results and verbalized understanding. Pt agreeable to have TEE. Pt voiced understanding of instructions. Will send to Admin to schedule.

## 2021-09-16 ENCOUNTER — Telehealth: Payer: Self-pay | Admitting: *Deleted

## 2021-09-16 ENCOUNTER — Encounter: Payer: Self-pay | Admitting: *Deleted

## 2021-09-16 ENCOUNTER — Other Ambulatory Visit: Payer: Self-pay

## 2021-09-16 ENCOUNTER — Encounter (HOSPITAL_COMMUNITY)
Admission: RE | Admit: 2021-09-16 | Discharge: 2021-09-16 | Disposition: A | Discharge status: Home / Self Care | Payer: Medicare Other | Source: Ambulatory Visit | Attending: Family Medicine | Admitting: Family Medicine

## 2021-09-16 ENCOUNTER — Ambulatory Visit (HOSPITAL_COMMUNITY): Admission: RE | Admit: 2021-09-16 | Payer: Medicare Other | Source: Ambulatory Visit

## 2021-09-16 DIAGNOSIS — R0789 Other chest pain: Secondary | ICD-10-CM | POA: Insufficient documentation

## 2021-09-16 NOTE — Telephone Encounter (Signed)
Patient informed and verbalized understanding of plan. TEE instructions read to patient and made available for pick up

## 2021-09-16 NOTE — Telephone Encounter (Signed)
Per Andy-Dr. Diona Browner, who interpreted the test, wants her to have a TEE in [place] with 3D imaging to further evaluate the valve due to the significantly elevated gradients in the valve.  Please schedule a TEE with 3D imaging in [place].

## 2021-09-16 NOTE — Telephone Encounter (Signed)
TEE scheduled on 09/30/2021 @8 :00 am Dr. (case number [phone]) at [facility] Scheduled with moderated sedation

## 2021-09-17 ENCOUNTER — Telehealth: Payer: Self-pay | Admitting: Cardiology

## 2021-09-17 NOTE — Telephone Encounter (Signed)
Checking percert on the following patient for testing scheduled     TEE 09/30/2021  [facility]

## 2021-09-21 ENCOUNTER — Encounter (HOSPITAL_COMMUNITY): Payer: Self-pay | Admitting: Cardiology

## 2021-09-22 NOTE — Progress Notes (Signed)
Attempted to obtain medical history via telephone, unable to reach at this time. I left a voicemail to return pre surgical testing department's phone call.  

## 2021-09-23 ENCOUNTER — Encounter (HOSPITAL_BASED_OUTPATIENT_CLINIC_OR_DEPARTMENT_OTHER)
Admission: RE | Admit: 2021-09-23 | Discharge: 2021-09-23 | Disposition: A | Discharge status: Home / Self Care | Payer: Medicare Other | Source: Ambulatory Visit | Attending: Family Medicine | Admitting: Family Medicine

## 2021-09-23 ENCOUNTER — Other Ambulatory Visit: Payer: Self-pay

## 2021-09-23 ENCOUNTER — Ambulatory Visit (HOSPITAL_COMMUNITY)
Admission: RE | Admit: 2021-09-23 | Discharge: 2021-09-23 | Disposition: A | Discharge status: Home / Self Care | Payer: Medicare Other | Source: Ambulatory Visit | Attending: Family Medicine | Admitting: Family Medicine

## 2021-09-23 DIAGNOSIS — R0609 Other forms of dyspnea: Secondary | ICD-10-CM | POA: Insufficient documentation

## 2021-09-23 DIAGNOSIS — R0789 Other chest pain: Secondary | ICD-10-CM | POA: Diagnosis not present

## 2021-09-23 LAB — NM MYOCAR MULTI W/SPECT W/WALL MOTION / EF
LV dias vol: 56 mL (ref 46–106)
LV sys vol: 17 mL
Nuc Stress EF: 69 %
Peak HR: 105 {beats}/min
RATE: 0.3
Rest HR: 67 {beats}/min
Rest Nuclear Isotope Dose: 8.5 mCi
SDS: 0
SRS: 4
SSS: 4
ST Depression (mm): 0 mm
Stress Nuclear Isotope Dose: 24.4 mCi
TID: 1.38

## 2021-09-23 MED ORDER — REGADENOSON 0.4 MG/5ML IV SOLN
INTRAVENOUS | Status: AC
  Administered 2021-09-23: 0.4 mg via INTRAVENOUS
  Filled 2021-09-23: qty 5

## 2021-09-23 MED ORDER — TECHNETIUM TC 99M TETROFOSMIN IV KIT
30.0000 | PACK | Freq: Once | INTRAVENOUS | Status: AC | PRN
  Administered 2021-09-23: 24.4 via INTRAVENOUS

## 2021-09-23 MED ORDER — SODIUM CHLORIDE FLUSH 0.9 % IV SOLN
INTRAVENOUS | Status: AC
  Administered 2021-09-23: 10 mL via INTRAVENOUS
  Filled 2021-09-23: qty 10

## 2021-09-23 MED ORDER — TECHNETIUM TC 99M TETROFOSMIN IV KIT
10.0000 | PACK | Freq: Once | INTRAVENOUS | Status: AC | PRN
  Administered 2021-09-23: 8.48 via INTRAVENOUS

## 2021-09-29 ENCOUNTER — Telehealth: Payer: Self-pay | Admitting: *Deleted

## 2021-09-29 NOTE — Telephone Encounter (Signed)
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  Would go ahead with that evaluation first, but I think that given her stress test findings and in particular if she does have prosthetic aortic stenosis, she is going to need to have a diagnostic cardiac catheterization thereafter.  Her coronary arteries however were normal in 2013 prior to aortic valve replacement. ----- Message ----- From: Netta Neat., NP Sent: 09/23/2021   3:07 PM EST To: Lesle Chris, LPN, Jonelle Sidle, MD  Please call the patient and let her know the stress test showed there were findings consistent with evidence of prior myocardial infarction with some moderate decrease in blood flow around that area.  Per the interpreting physician he states this stress test would be considered low to intermediate risk based on perfusion defect alone.  She also has what is referred to as (transient ischemic dilation) of the left ventricle on post stress imaging which would suggest some balanced ischemia (decreased blood flow) and indicates a higher risk.  I will forward this to her primary cardiologist for his evaluation and decision making.   Netta Neat, NP  09/23/2021 2:59 PM

## 2021-09-29 NOTE — Telephone Encounter (Signed)
Patient informed. Copy sent to PCP °

## 2021-09-30 ENCOUNTER — Encounter: Payer: Self-pay | Admitting: *Deleted

## 2021-09-30 ENCOUNTER — Encounter (HOSPITAL_COMMUNITY)
Admission: RE | Disposition: A | Discharge status: Home / Self Care | Payer: Self-pay | Source: Ambulatory Visit | Attending: Cardiology

## 2021-09-30 ENCOUNTER — Ambulatory Visit (HOSPITAL_COMMUNITY): Payer: Medicare Other | Admitting: Certified Registered"

## 2021-09-30 ENCOUNTER — Telehealth: Payer: Self-pay | Admitting: *Deleted

## 2021-09-30 ENCOUNTER — Ambulatory Visit (HOSPITAL_BASED_OUTPATIENT_CLINIC_OR_DEPARTMENT_OTHER): Payer: Medicare Other

## 2021-09-30 ENCOUNTER — Ambulatory Visit (HOSPITAL_COMMUNITY)
Admission: RE | Admit: 2021-09-30 | Discharge: 2021-09-30 | Disposition: A | Discharge status: Home / Self Care | Payer: Medicare Other | Source: Ambulatory Visit | Attending: Cardiology | Admitting: Cardiology

## 2021-09-30 ENCOUNTER — Encounter (HOSPITAL_COMMUNITY): Payer: Self-pay | Admitting: Cardiology

## 2021-09-30 ENCOUNTER — Other Ambulatory Visit: Payer: Self-pay | Admitting: Cardiology

## 2021-09-30 ENCOUNTER — Other Ambulatory Visit: Payer: Self-pay

## 2021-09-30 DIAGNOSIS — R0789 Other chest pain: Secondary | ICD-10-CM | POA: Insufficient documentation

## 2021-09-30 DIAGNOSIS — Z87891 Personal history of nicotine dependence: Secondary | ICD-10-CM | POA: Diagnosis not present

## 2021-09-30 DIAGNOSIS — I110 Hypertensive heart disease with heart failure: Secondary | ICD-10-CM | POA: Diagnosis not present

## 2021-09-30 DIAGNOSIS — Z953 Presence of xenogenic heart valve: Secondary | ICD-10-CM | POA: Diagnosis not present

## 2021-09-30 DIAGNOSIS — I5032 Chronic diastolic (congestive) heart failure: Secondary | ICD-10-CM | POA: Diagnosis not present

## 2021-09-30 DIAGNOSIS — I352 Nonrheumatic aortic (valve) stenosis with insufficiency: Secondary | ICD-10-CM | POA: Diagnosis not present

## 2021-09-30 DIAGNOSIS — R232 Flushing: Secondary | ICD-10-CM | POA: Diagnosis not present

## 2021-09-30 DIAGNOSIS — R9439 Abnormal result of other cardiovascular function study: Secondary | ICD-10-CM

## 2021-09-30 DIAGNOSIS — R42 Dizziness and giddiness: Secondary | ICD-10-CM | POA: Diagnosis not present

## 2021-09-30 DIAGNOSIS — I350 Nonrheumatic aortic (valve) stenosis: Secondary | ICD-10-CM

## 2021-09-30 DIAGNOSIS — R0602 Shortness of breath: Secondary | ICD-10-CM | POA: Insufficient documentation

## 2021-09-30 DIAGNOSIS — N951 Menopausal and female climacteric states: Secondary | ICD-10-CM | POA: Diagnosis not present

## 2021-09-30 DIAGNOSIS — Z01810 Encounter for preprocedural cardiovascular examination: Secondary | ICD-10-CM

## 2021-09-30 HISTORY — PX: TEE WITHOUT CARDIOVERSION: SHX5443

## 2021-09-30 HISTORY — DX: Type 2 diabetes mellitus without complications: E11.9

## 2021-09-30 LAB — GLUCOSE, CAPILLARY
Glucose-Capillary: 122 mg/dL — ABNORMAL HIGH (ref 70–99)
Glucose-Capillary: 121 mg/dL — ABNORMAL HIGH (ref 70–99)

## 2021-09-30 LAB — ECHO TEE
AR max vel: 0.66 cm2
AV Area VTI: 0.67 cm2
AV Area mean vel: 0.67 cm2
AV Mean grad: 55 mmHg
AV Peak grad: 96 mmHg
Ao pk vel: 4.9 m/s

## 2021-09-30 SURGERY — ECHOCARDIOGRAM, TRANSESOPHAGEAL
Anesthesia: Monitor Anesthesia Care

## 2021-09-30 MED ORDER — PROPOFOL 500 MG/50ML IV EMUL
INTRAVENOUS | Status: DC | PRN
  Administered 2021-09-30: 75 ug/kg/min via INTRAVENOUS
  Administered 2021-09-30: 100 ug/kg/min via INTRAVENOUS

## 2021-09-30 MED ORDER — LIDOCAINE 2% (20 MG/ML) 5 ML SYRINGE
INTRAMUSCULAR | Status: DC | PRN
Start: 2021-09-30 — End: 2021-09-30
  Administered 2021-09-30: 80 mg via INTRAVENOUS

## 2021-09-30 MED ORDER — PROPOFOL 10 MG/ML IV BOLUS
INTRAVENOUS | Status: DC | PRN
  Administered 2021-09-30: 15 mg via INTRAVENOUS
  Administered 2021-09-30: 20 mg via INTRAVENOUS

## 2021-09-30 MED ORDER — SODIUM CHLORIDE 0.9% FLUSH: 3.0000 mL | Freq: Two times a day (BID) | INTRAVENOUS | Status: AC

## 2021-09-30 MED ORDER — BUTAMBEN-TETRACAINE-BENZOCAINE 2-2-14 % EX AERO
INHALATION_SPRAY | CUTANEOUS | Status: DC | PRN
  Administered 2021-09-30: 2 via TOPICAL

## 2021-09-30 MED ORDER — SODIUM CHLORIDE 0.9 % IV SOLN: INTRAVENOUS | Status: DC

## 2021-09-30 NOTE — Telephone Encounter (Signed)
Patient informed and verbalized understanding of plan. Copy sent to PCP 

## 2021-09-30 NOTE — Anesthesia Preprocedure Evaluation (Signed)
Anesthesia Evaluation  Patient identified by MRN, date of birth, ID band Patient awake    Reviewed: Allergy & Precautions, NPO status , Patient's Chart, lab work & pertinent test results  History of Anesthesia Complications (+) PONV and history of anesthetic complications  Airway Mallampati: II       Dental no notable dental hx.    Pulmonary former smoker,    Pulmonary exam normal        Cardiovascular hypertension, Pt. on medications Normal cardiovascular examIII+ Valvular Problems/Murmurs      Neuro/Psych negative neurological ROS  negative psych ROS   GI/Hepatic Neg liver ROS,   Endo/Other  diabetes, Well Controlled, Type 2, Oral Hypoglycemic Agents  Renal/GU negative Renal ROS  negative genitourinary   Musculoskeletal negative musculoskeletal ROS (+)   Abdominal Normal abdominal exam  (+)   Peds  Hematology   Anesthesia Other Findings S/P AVR in 2013 now with increasing SOB.  Reproductive/Obstetrics                             Anesthesia Physical Anesthesia Plan  ASA: 3  Anesthesia Plan: MAC   Post-op Pain Management:    Induction: Intravenous  PONV Risk Score and Plan: Propofol infusion  Airway Management Planned: Mask and Natural Airway  Additional Equipment: TEE  Intra-op Plan:   Post-operative Plan:   Informed Consent: I have reviewed the patients History and Physical, chart, labs and discussed the procedure including the risks, benefits and alternatives for the proposed anesthesia with the patient or authorized representative who has indicated his/her understanding and acceptance.     Dental advisory given  Plan Discussed with: CRNA  Anesthesia Plan Comments:         Anesthesia Quick Evaluation

## 2021-09-30 NOTE — Telephone Encounter (Signed)
-----   Message from Jonelle Sidle, MD sent at 09/30/2021  4:16 PM EST ----- To put everything together, she had a recent abnormal Myoview ordered by Mr. 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  In light of this, we need to get her set up for a right and left heart catheterization, ideally I would get this with Dr. Excell Seltzer or Dr. Clifton James on the valve team, but she is likely going to need a TCTS consultation thereafter to discuss best way to manage her aortic valve.  She saw Mr. Vincenza Hews NP earlier in November, does not necessarily need to come into the office if we can get this set up soon, but her note would need to be modified and we would need appropriate lab work. ----- Message ----- From: [name], MD Sent: 09/30/2021   3:32 PM EST To: Jonelle Sidle, MD  Her TEE shows severe bioprosthetic AoV stenosis as you suspected (sorry to say). Super nice lady!  Hope you are having a good day!   ----- Message ----- From: Interface, [employer] Sent: 09/30/2021  10:35 AM EST To: [name], MD

## 2021-09-30 NOTE — Telephone Encounter (Signed)
Heart Cath instructions reviewed with patient and made available for pick up at the [facility] office Verbalized understanding of plan

## 2021-09-30 NOTE — Anesthesia Postprocedure Evaluation (Signed)
Anesthesia Post Note  Patient: Courtney Harvey  Procedure(s) Performed: TRANSESOPHAGEAL ECHOCARDIOGRAM (TEE) WITH 3D IMAGING     Patient location during evaluation: PACU Anesthesia Type: MAC Level of consciousness: awake Pain management: pain level controlled Vital Signs Assessment: post-procedure vital signs reviewed and stable Respiratory status: spontaneous breathing Cardiovascular status: stable Postop Assessment: no apparent nausea or vomiting Anesthetic complications: no   No notable events documented.  Last Vitals:  Vitals:   09/30/21 0840 09/30/21 0855  BP: 97/70   Pulse: (!) 55   Resp: 18   Temp:  (!) 36.1 C  SpO2: 98%     Last Pain:  Vitals:   09/30/21 0855  TempSrc:   PainSc: 0-No pain                 Huston Foley

## 2021-09-30 NOTE — Transfer of Care (Signed)
Immediate Anesthesia Transfer of Care Note  Patient: Courtney Harvey  Procedure(s) Performed: TRANSESOPHAGEAL ECHOCARDIOGRAM (TEE) WITH 3D IMAGING  Patient Location: PACU  Anesthesia Type:General  Level of Consciousness: drowsy  Airway & Oxygen Therapy: Patient Spontanous Breathing  Post-op Assessment: Report given to RN and Post -op Vital signs reviewed and stable  Post vital signs: Reviewed and stable  Last Vitals:  Vitals Value Taken Time  BP 93/69 09/30/21 0824  Temp    Pulse 60 09/30/21 0825  Resp 20 09/30/21 0825  SpO2 94 % 09/30/21 0825  Vitals shown include unvalidated device data.  Last Pain:  Vitals:   09/30/21 0703  TempSrc: Oral  PainSc: 0-No pain         Complications: No notable events documented.

## 2021-09-30 NOTE — CV Procedure (Signed)
     Transesophageal Echocardiogram Note  AVERLEE SWARTZ [id number] February 19, 1956  Procedure: Transesophageal Echocardiogram Indications: Bioprosthetic AoV stenosis  Procedure Details Consent: Obtained Time Out: Verified patient identification, verified procedure, site/side was marked, verified correct patient position, special equipment/implants available, Radiology Safety Procedures followed,  medications/allergies/relevent history reviewed, required imaging and test results available.  Performed  Medications: Propofol 290mg  Lidocaine 80mg   Left Ventrical:  LVEF 60-65%  Mitral Valve: Mild MR  Aortic Valve: The bioprosthetic aortic valve leaflets are thickened and calcified along the commissural lines with severely restricted mobility. There is severe bioprosthetic aortic valve stenosis with mean gradient , Vmax 4.66m/s, AVA 0.7  Tricuspid Valve: Trivial TR  Pulmonic Valve: Trivial PI  Left Atrium/ Left atrial appendage: No LAA thrombus  Atrial septum: No PFO by color doppler  Aorta: Grade III plaque   Complications: No apparent complications Patient did tolerate procedure well.  , MD 09/30/2021, 8:17 AM

## 2021-09-30 NOTE — Progress Notes (Signed)
This serves as an addendum to office note by Mr. Vincenza Hews NP on November 7.   I reviewed the patient's Myoview report from November 23 showing perfusion defect suggestive of inferior infarct scar and moderate peri-infarct ischemia, also increased TID ratio raising concern for possible balanced ischemia and multivessel disease.  Of note, she had normal coronaries by cardiac catheterization in 2012 prior to aortic valve surgery.  Echocardiogram on November 11 raised concern for prosthetic aortic stenosis of severe degree.  She has a 21 mm [facility] Ease pericardial valve and at that point mean gradient was 61 mmHg.  She was subsequently scheduled for a TEE which was performed earlier today and is also consistent with severe prosthetic aortic stenosis, mean gradient 55 mmHg and V-max 4.9 m/s.  Plan at this point is to pursue a right and left heart catheterization with Dr. Excell Seltzer and then evaluation by the valve team with TCTS to determine best way to approach her aortic valve.  Jonelle Sidle, M.D., F.A.C.C.

## 2021-09-30 NOTE — Interval H&P Note (Signed)
History and Physical Interval Note:  09/30/2021 7:49 AM  Courtney Harvey  has presented today for surgery, with the diagnosis of SHORTNESS OF BREATH, PROSTETIC AORTIC VALVE.  The various methods of treatment have been discussed with the patient and family. After consideration of risks, benefits and other options for treatment, the patient has consented to  Procedure(s): TRANSESOPHAGEAL ECHOCARDIOGRAM (TEE) WITH 3D IMAGING (N/A) as a surgical intervention.  The patient's history has been reviewed, patient examined, no change in status, stable for surgery.  I have reviewed the patient's chart and labs.  Questions were answered to the patient's satisfaction.     Courtney Harvey

## 2021-09-30 NOTE — Telephone Encounter (Signed)
  [facility] CARDIOVASCULAR DIVISION [facility] [address] [place] [address] Dept: [phone] Loc: [phone]  Courtney Harvey  09/30/2021  You are scheduled for a Cardiac Catheterization on Friday, December 2 with Dr. [name].  1. Please arrive at the [facility] (Main Entrance A) at [facility]: [address] [place], [place] [address] at 10:00 AM (This time is two hours before your procedure to ensure your preparation). Free valet parking service is available.   Special note: Every effort is made to have your procedure done on time. Please understand that emergencies sometimes delay scheduled procedures.  2. Diet: Do not eat solid foods after midnight.  The patient may have clear liquids until 5am upon the day of the procedure.  3. Labs: You will need to have blood drawn on Thursday, December 1 at [facility] [address], [place]  Open: 7am - 6pm, Sat 8am - 12 noon   Phone: [phone]. You do not need to be fasting.  4. Medication instructions in preparation for your procedure:   Contrast Allergy: No  Hold triameterene/hctz the morning of your cath  Do not take Diabetes Med Glucophage (Metformin) on the day of the procedure and HOLD 48 HOURS AFTER THE PROCEDURE.  On the morning of your procedure, take your Aspirin 81 mg and any morning medicines NOT listed above.  You may use sips of water.  5. Plan for one night stay--bring personal belongings. 6. Bring a current list of your medications and current insurance cards. 7. You MUST have a responsible person to drive you home. 8. Someone MUST be with you the first 24 hours after you arrive home or your discharge will be delayed. 9. Please wear clothes that are easy to get on and off and wear slip-on shoes.  Thank you for allowing [place] to care for you!   -- [facility]

## 2021-09-30 NOTE — Anesthesia Procedure Notes (Signed)
Procedure Name: MAC Date/Time: 09/30/2021 7:54 AM Performed by: Imagene Riches, CRNA Pre-anesthesia Checklist: Patient identified, Emergency Drugs available, Suction available, Patient being monitored and Timeout performed Patient Re-evaluated:Patient Re-evaluated prior to induction Oxygen Delivery Method: Nasal cannula

## 2021-09-30 NOTE — Progress Notes (Signed)
  Echocardiogram Echocardiogram Transesophageal has been performed.  Augustine Radar 09/30/2021, 8:50 AM

## 2021-10-01 ENCOUNTER — Telehealth: Payer: Self-pay | Admitting: *Deleted

## 2021-10-01 LAB — BASIC METABOLIC PANEL
BUN: 21 mg/dL (ref 7–25)
CO2: 30 mmol/L (ref 20–32)
Calcium: 9.8 mg/dL (ref 8.6–10.4)
Chloride: 103 mmol/L (ref 98–110)
Creat: 1 mg/dL (ref 0.50–1.05)
Glucose, Bld: 105 mg/dL — ABNORMAL HIGH (ref 65–99)
Potassium: 4.3 mmol/L (ref 3.5–5.3)
Sodium: 141 mmol/L (ref 135–146)

## 2021-10-01 LAB — CBC
HCT: 39.9 % (ref 35.0–45.0)
Hemoglobin: 13.5 g/dL (ref 11.7–15.5)
MCH: 30.2 pg (ref 27.0–33.0)
MCHC: 33.8 g/dL (ref 32.0–36.0)
MCV: 89.3 fL (ref 80.0–100.0)
MPV: 11 fL (ref 7.5–12.5)
Platelets: 195 10*3/uL (ref 140–400)
RBC: 4.47 10*6/uL (ref 3.80–5.10)
RDW: 12.4 % (ref 11.0–15.0)
WBC: 6.1 10*3/uL (ref 3.8–10.8)

## 2021-10-01 NOTE — Telephone Encounter (Addendum)
Cardiac catheterization scheduled at [facility] for: Friday October 02, 2021 12 Noon Arrive [facility] Main Entrance A [facility]) at: 10 AM   Diet-no solid food after midnight prior to cath, clear liquids until 5 AM day of procedure.  Medication instructions for procedure: -Hold:   Metformin-day of procedure and 48 hours post procedure  Triamterene-HCTZ-AM of procedure  -Except hold medications usual morning medications can be taken pre-cath with sips of water including aspirin 81 mg.    Confirmed patient has responsible adult to drive home post procedure and be with patient first 24 hours after arriving home.  [facility] does allow one visitor to accompany you and wait in the hospital waiting room while you are there for your procedure. You and your visitor will be asked to wear a mask once you enter the hospital.   Patient reports does not currently have any new symptoms concerning for COVID-19 and no household members with COVID-19 like illness.    Reviewed procedure/mask/visitor instructions with patient.

## 2021-10-02 ENCOUNTER — Other Ambulatory Visit: Payer: Self-pay

## 2021-10-02 ENCOUNTER — Encounter (HOSPITAL_COMMUNITY): Payer: Self-pay | Admitting: Cardiology

## 2021-10-02 ENCOUNTER — Ambulatory Visit (HOSPITAL_COMMUNITY)
Admission: RE | Admit: 2021-10-02 | Discharge: 2021-10-02 | Disposition: A | Discharge status: Home / Self Care | Payer: Medicare Other | Source: Ambulatory Visit | Attending: Cardiovascular Disease | Admitting: Cardiovascular Disease

## 2021-10-02 ENCOUNTER — Encounter (HOSPITAL_COMMUNITY)
Admission: RE | Disposition: A | Discharge status: Home / Self Care | Payer: Self-pay | Source: Ambulatory Visit | Attending: Cardiovascular Disease

## 2021-10-02 DIAGNOSIS — R0789 Other chest pain: Secondary | ICD-10-CM | POA: Diagnosis not present

## 2021-10-02 DIAGNOSIS — R42 Dizziness and giddiness: Secondary | ICD-10-CM | POA: Diagnosis not present

## 2021-10-02 DIAGNOSIS — I110 Hypertensive heart disease with heart failure: Secondary | ICD-10-CM | POA: Diagnosis not present

## 2021-10-02 DIAGNOSIS — Z953 Presence of xenogenic heart valve: Secondary | ICD-10-CM | POA: Diagnosis not present

## 2021-10-02 DIAGNOSIS — R232 Flushing: Secondary | ICD-10-CM | POA: Diagnosis not present

## 2021-10-02 DIAGNOSIS — I5032 Chronic diastolic (congestive) heart failure: Secondary | ICD-10-CM | POA: Insufficient documentation

## 2021-10-02 DIAGNOSIS — I350 Nonrheumatic aortic (valve) stenosis: Secondary | ICD-10-CM

## 2021-10-02 DIAGNOSIS — R9439 Abnormal result of other cardiovascular function study: Secondary | ICD-10-CM

## 2021-10-02 DIAGNOSIS — I352 Nonrheumatic aortic (valve) stenosis with insufficiency: Secondary | ICD-10-CM | POA: Diagnosis not present

## 2021-10-02 DIAGNOSIS — N951 Menopausal and female climacteric states: Secondary | ICD-10-CM | POA: Diagnosis not present

## 2021-10-02 DIAGNOSIS — E119 Type 2 diabetes mellitus without complications: Secondary | ICD-10-CM | POA: Diagnosis not present

## 2021-10-02 DIAGNOSIS — Z87891 Personal history of nicotine dependence: Secondary | ICD-10-CM | POA: Diagnosis not present

## 2021-10-02 HISTORY — PX: RIGHT HEART CATH AND CORONARY ANGIOGRAPHY: CATH118264

## 2021-10-02 LAB — POCT I-STAT 7, (LYTES, BLD GAS, ICA,H+H)
Acid-base deficit: 1 mmol/L (ref 0.0–2.0)
Bicarbonate: 24.9 mmol/L (ref 20.0–28.0)
Calcium, Ion: 1.3 mmol/L (ref 1.15–1.40)
HCT: 34 % — ABNORMAL LOW (ref 36.0–46.0)
Hemoglobin: 11.6 g/dL — ABNORMAL LOW (ref 12.0–15.0)
O2 Saturation: 98 %
Potassium: 3.6 mmol/L (ref 3.5–5.1)
Sodium: 141 mmol/L (ref 135–145)
TCO2: 26 mmol/L (ref 22–32)
pCO2 arterial: 44 mmHg (ref 32.0–48.0)
pH, Arterial: 7.361 (ref 7.350–7.450)
pO2, Arterial: 104 mmHg (ref 83.0–108.0)

## 2021-10-02 LAB — POCT I-STAT EG7
Acid-Base Excess: 1 mmol/L (ref 0.0–2.0)
Bicarbonate: 26.6 mmol/L (ref 20.0–28.0)
Calcium, Ion: 1.25 mmol/L (ref 1.15–1.40)
HCT: 34 % — ABNORMAL LOW (ref 36.0–46.0)
Hemoglobin: 11.6 g/dL — ABNORMAL LOW (ref 12.0–15.0)
O2 Saturation: 70 %
Potassium: 3.5 mmol/L (ref 3.5–5.1)
Sodium: 142 mmol/L (ref 135–145)
TCO2: 28 mmol/L (ref 22–32)
pCO2, Ven: 47.6 mmHg (ref 44.0–60.0)
pH, Ven: 7.355 (ref 7.250–7.430)
pO2, Ven: 39 mmHg (ref 32.0–45.0)

## 2021-10-02 LAB — GLUCOSE, CAPILLARY: Glucose-Capillary: 91 mg/dL (ref 70–99)

## 2021-10-02 SURGERY — RIGHT HEART CATH AND CORONARY ANGIOGRAPHY
Anesthesia: LOCAL

## 2021-10-02 MED ORDER — LIDOCAINE HCL (PF) 1 % IJ SOLN
INTRAMUSCULAR | Status: AC
  Filled 2021-10-02: qty 30

## 2021-10-02 MED ORDER — SODIUM CHLORIDE 0.9% FLUSH: 3.0000 mL | INTRAVENOUS | Status: DC | PRN

## 2021-10-02 MED ORDER — ASPIRIN 81 MG PO CHEW: 81.0000 mg | CHEWABLE_TABLET | ORAL | Status: DC

## 2021-10-02 MED ORDER — FENTANYL CITRATE (PF) 100 MCG/2ML IJ SOLN
INTRAMUSCULAR | Status: AC
  Filled 2021-10-02: qty 2

## 2021-10-02 MED ORDER — LABETALOL HCL 5 MG/ML IV SOLN: 10.0000 mg | INTRAVENOUS | Status: DC | PRN

## 2021-10-02 MED ORDER — SODIUM CHLORIDE 0.9 % IV SOLN: 250.0000 mL | INTRAVENOUS | Status: DC | PRN

## 2021-10-02 MED ORDER — SODIUM CHLORIDE 0.9 % WEIGHT BASED INFUSION: 1.0000 mL/kg/h | INTRAVENOUS | Status: DC

## 2021-10-02 MED ORDER — HYDRALAZINE HCL 20 MG/ML IJ SOLN: 10.0000 mg | INTRAMUSCULAR | Status: DC | PRN

## 2021-10-02 MED ORDER — LIDOCAINE HCL (PF) 1 % IJ SOLN
INTRAMUSCULAR | Status: DC | PRN
  Administered 2021-10-02 (×2): 2 mL via INTRADERMAL

## 2021-10-02 MED ORDER — VERAPAMIL HCL 2.5 MG/ML IV SOLN
INTRAVENOUS | Status: AC
  Filled 2021-10-02: qty 2

## 2021-10-02 MED ORDER — ONDANSETRON HCL 4 MG/2ML IJ SOLN: 4.0000 mg | Freq: Four times a day (QID) | INTRAMUSCULAR | Status: DC | PRN

## 2021-10-02 MED ORDER — HEPARIN SODIUM (PORCINE) 1000 UNIT/ML IJ SOLN
INTRAMUSCULAR | Status: DC | PRN
  Administered 2021-10-02: 4000 [IU] via INTRAVENOUS

## 2021-10-02 MED ORDER — HEPARIN (PORCINE) IN NACL 1000-0.9 UT/500ML-% IV SOLN
INTRAVENOUS | Status: DC | PRN
  Administered 2021-10-02 (×2): 500 mL

## 2021-10-02 MED ORDER — SODIUM CHLORIDE 0.9 % WEIGHT BASED INFUSION
3.0000 mL/kg/h | INTRAVENOUS | Status: AC
  Administered 2021-10-02: 3 mL/kg/h via INTRAVENOUS

## 2021-10-02 MED ORDER — SODIUM CHLORIDE 0.9% FLUSH: 3.0000 mL | Freq: Two times a day (BID) | INTRAVENOUS | Status: DC

## 2021-10-02 MED ORDER — VERAPAMIL HCL 2.5 MG/ML IV SOLN
INTRAVENOUS | Status: DC | PRN
  Administered 2021-10-02: 10 mL via INTRA_ARTERIAL

## 2021-10-02 MED ORDER — ACETAMINOPHEN 325 MG PO TABS: 650.0000 mg | ORAL_TABLET | ORAL | Status: DC | PRN

## 2021-10-02 MED ORDER — HEPARIN SODIUM (PORCINE) 1000 UNIT/ML IJ SOLN
INTRAMUSCULAR | Status: AC
  Filled 2021-10-02: qty 10

## 2021-10-02 MED ORDER — IOHEXOL 350 MG/ML SOLN
INTRAVENOUS | Status: DC | PRN
  Administered 2021-10-02: 50 mL

## 2021-10-02 MED ORDER — FENTANYL CITRATE (PF) 100 MCG/2ML IJ SOLN
INTRAMUSCULAR | Status: DC | PRN
  Administered 2021-10-02: 25 ug via INTRAVENOUS

## 2021-10-02 MED ORDER — MIDAZOLAM HCL 2 MG/2ML IJ SOLN
INTRAMUSCULAR | Status: DC | PRN
  Administered 2021-10-02: 2 mg via INTRAVENOUS

## 2021-10-02 MED ORDER — MIDAZOLAM HCL 2 MG/2ML IJ SOLN
INTRAMUSCULAR | Status: AC
  Filled 2021-10-02: qty 2

## 2021-10-02 MED ORDER — HEPARIN (PORCINE) IN NACL 1000-0.9 UT/500ML-% IV SOLN
INTRAVENOUS | Status: AC
  Filled 2021-10-02: qty 1000

## 2021-10-02 SURGICAL SUPPLY — 11 items

## 2021-10-02 NOTE — Interval H&P Note (Signed)
History and Physical Interval Note:  10/02/2021 2:02 PM  Courtney Harvey  has presented today for surgery, with the diagnosis of aortic stenosis in prostheic valve.  The various methods of treatment have been discussed with the patient and family. After consideration of risks, benefits and other options for treatment, the patient has consented to  Procedure(s): RIGHT/LEFT HEART CATH AND CORONARY ANGIOGRAPHY (N/A) as a surgical intervention.  The patient's history has been reviewed, patient examined, no change in status, stable for surgery.  I have reviewed the patient's chart and labs.  Questions were answered to the patient's satisfaction.     [name]

## 2021-10-05 ENCOUNTER — Encounter (HOSPITAL_COMMUNITY): Payer: Self-pay | Admitting: Cardiovascular Disease

## 2021-10-05 ENCOUNTER — Other Ambulatory Visit: Payer: Self-pay

## 2021-10-05 DIAGNOSIS — Z953 Presence of xenogenic heart valve: Secondary | ICD-10-CM

## 2021-10-05 DIAGNOSIS — I350 Nonrheumatic aortic (valve) stenosis: Secondary | ICD-10-CM

## 2021-10-15 ENCOUNTER — Other Ambulatory Visit: Payer: Self-pay

## 2021-10-15 ENCOUNTER — Ambulatory Visit (HOSPITAL_COMMUNITY)
Admission: RE | Admit: 2021-10-15 | Discharge: 2021-10-15 | Disposition: A | Discharge status: Home / Self Care | Payer: Medicare Other | Source: Ambulatory Visit | Attending: Cardiovascular Disease | Admitting: Cardiovascular Disease

## 2021-10-15 ENCOUNTER — Encounter (HOSPITAL_COMMUNITY): Payer: Self-pay

## 2021-10-15 DIAGNOSIS — Z953 Presence of xenogenic heart valve: Secondary | ICD-10-CM | POA: Diagnosis not present

## 2021-10-15 DIAGNOSIS — I350 Nonrheumatic aortic (valve) stenosis: Secondary | ICD-10-CM | POA: Diagnosis present

## 2021-10-15 DIAGNOSIS — I700 Atherosclerosis of aorta: Secondary | ICD-10-CM | POA: Diagnosis not present

## 2021-10-15 MED ORDER — IOHEXOL 350 MG/ML SOLN
95.0000 mL | Freq: Once | INTRAVENOUS | Status: AC | PRN
  Administered 2021-10-15: 95 mL via INTRAVENOUS

## 2021-10-15 NOTE — Progress Notes (Signed)
Called and spoke to Dr. Izora Ribas who is reading patient's CT scan today. Patient's heart rate is ranging from 78-82 with a blood pressure of 96/77.  He stated to proceed with scan without any medications. Nothing further needed at this time.

## 2021-10-19 ENCOUNTER — Ambulatory Visit: Payer: Medicare Other | Admitting: Physician Assistant

## 2021-11-06 ENCOUNTER — Ambulatory Visit: Payer: Medicare Other | Admitting: Cardiology

## 2021-11-06 ENCOUNTER — Encounter: Payer: Self-pay | Admitting: Cardiology

## 2021-11-06 VITALS — BP 118/78 | HR 80 | Ht 66.0 in | Wt 177.0 lb

## 2021-11-06 DIAGNOSIS — T82857D Stenosis of cardiac prosthetic devices, implants and grafts, subsequent encounter: Secondary | ICD-10-CM | POA: Diagnosis not present

## 2021-11-06 DIAGNOSIS — I10 Essential (primary) hypertension: Secondary | ICD-10-CM | POA: Diagnosis not present

## 2021-11-06 NOTE — Patient Instructions (Addendum)
Medication Instructions:   Your physician recommends that you continue on your current medications as directed. Please refer to the Current Medication list given to you today.  Labwork:  none  Testing/Procedures:  none  Follow-Up:  Your physician recommends that you schedule a follow-up appointment in: 3 months.  Any Other Special Instructions Will Be Listed Below (If Applicable).  If you need a refill on your cardiac medications before your next appointment, please call your pharmacy. 

## 2021-11-06 NOTE — Progress Notes (Signed)
Cardiology Office Note  Date: 11/06/2021   ID: Courtney Harvey, DOB 1956/02/09, MRN [id number]  PCP:  Suzan Slickucker, Alethea Y, MD  Cardiologist:  Nona DellSamuel Jenkins Risdon, MD Electrophysiologist:  None   Chief Complaint  Patient presents with   Cardiac follow-up    History of Present Illness: Courtney Harvey is a 66 y.o. female last seen in November 2022 by Mr. Vincenza HewsQuinn NP.  I reviewed interval testing, TEE and cardiac catheterization results are outlined below.  She has only mild coronary luminal irregularities with evidence of severe prosthetic aortic stenosis.  Coronary CTA results are also described below.  She has a pending visit with Dr. Laneta SimmersBartle on January 16 to discuss valve replacement options.  No major change in symptoms, still reporting NYHA class II-III dyspnea, no angina.  She has had no syncope.  I reviewed her medications which are noted below.  Past Medical History:  Diagnosis Date   Aortic insufficiency and aortic stenosis    AR/AS by echo/cath 3/013 [name]([place]) s/p tissue AVR 01/26/12 (EF 55-60%, no CAD by cath at that time)   Chronic diastolic heart failure [facility](HCC)    Essential hypertension    Glaucoma    H/O hiatal hernia    History of cardiac catheterization    Minimal coronary luminal irregularities December 2022   History of kidney stones    Recurrent upper respiratory infection (URI)    S/P aortic valve replacement 01/26/2012   21mm [facility] Magna Ease pericardial tissue valve   S/P patent foramen ovale closure 01/26/2012   PFO found on TEE prior to AVR. Performed at the time of aortic valve replacement   Type 2 diabetes mellitus [facility](HCC)     Past Surgical History:  Procedure Laterality Date   AORTIC VALVE REPLACEMENT  01/26/2012   Procedure: AORTIC VALVE REPLACEMENT (AVR);  Surgeon: Purcell Nailslarence H Owen, MD;  [facility];  Service: Open Heart Surgery;  Laterality: N/A;   CARDIAC CATHETERIZATION  2005 and 2013   Friday March 8th       pcp Dr tapper  in [place]         HYSTEROSCOPY WITH D & C N/A 09/06/2018   Procedure: DILATATION AND CURETTAGE /HYSTEROSCOPY  ENDOMETRIAL POLYPECTOMY;  Surgeon: Lazaro ArmsEure, Luther H, MD;  Location: [facility] ORS;  Service: Gynecology;  Laterality: N/A;   LEFT AND RIGHT HEART CATHETERIZATION WITH CORONARY ANGIOGRAM N/A 01/07/2012   Procedure: LEFT AND RIGHT HEART CATHETERIZATION WITH CORONARY ANGIOGRAM;  Surgeon: Wendall StadePeter C Nishan, MD;  [facility];  Service: Cardiovascular;  Laterality: N/A;   RIGHT HEART CATH AND CORONARY ANGIOGRAPHY N/A 10/02/2021   Procedure: RIGHT HEART CATH AND CORONARY ANGIOGRAPHY;  Surgeon: Tonny Bollmanooper, Michael, MD;  [facility];  Service: Cardiovascular;  Laterality: N/A;   TEE WITHOUT CARDIOVERSION  01/10/2012   Procedure: TRANSESOPHAGEAL ECHOCARDIOGRAM (TEE);  Surgeon: Pricilla RifflePaula V Ross, MD;  [facility];  Service: Cardiovascular;  Laterality: N/A;  TEE call Trish in am for time   TEE WITHOUT CARDIOVERSION N/A 09/30/2021   Procedure: TRANSESOPHAGEAL ECHOCARDIOGRAM (TEE) WITH 3D IMAGING;  Surgeon: Meriam SpraguePemberton, Heather E, MD;  [facility];  Service: Cardiovascular;  Laterality: N/A;    Current Outpatient Medications  Medication Sig Dispense Refill   acetaminophen (TYLENOL) 500 MG tablet Take 1,000 mg by mouth every 6 (six) hours as needed for moderate pain.     cetirizine (ZYRTEC) 10 MG tablet Take 10 mg by mouth daily.     fluticasone (FLONASE) 50 MCG/ACT nasal spray Place  1 spray into both nostrils daily as needed for allergies or rhinitis.     metFORMIN (GLUCOPHAGE-XR) 500 MG 24 hr tablet Take 500 mg by mouth daily before breakfast.     nicotine polacrilex (NICORETTE) 2 MG gum Take 2 mg by mouth as needed for smoking cessation.     triamterene-hydrochlorothiazide (MAXZIDE-25) 37.5-25 MG tablet Take 1 tablet by mouth daily.  1   Current Facility-Administered Medications  Medication Dose Route Frequency Provider Last Rate Last Admin   sodium chloride flush (NS) 0.9 % injection 3 mL  3  mL Intravenous Q12H Jonelle Sidle, MD       Allergies:  Peanut-containing drug products and Wellbutrin [bupropion hcl]   ROS: No orthopnea or PND.  Physical Exam: VS:  BP 118/78 (BP Location: Right Arm, Patient Position: Sitting, Cuff Size: Normal)    Pulse 80    Ht 5\' 6"  (1.676 m)    Wt 177 lb (80.3 kg)    LMP  (LMP Unknown)    SpO2 96%    BMI 28.57 kg/m , BMI Body mass index is 28.57 kg/m.  Wt Readings from Last 3 Encounters:  11/06/21 177 lb (80.3 kg)  10/02/21 170 lb (77.1 kg)  09/30/21 170 lb (77.1 kg)    General: Patient appears comfortable at rest. HEENT: Conjunctiva and lids normal, wearing a mask. Neck: Supple, no elevated JVP or carotid bruits, no thyromegaly. Lungs: Clear to auscultation, nonlabored breathing at rest. Cardiac: Regular rate and rhythm, no S3, 3/6 systolic murmur. Extremities: No pitting edema.  ECG:  An ECG dated 09/07/2021 was personally reviewed today and demonstrated:  Sinus rhythm with nonspecific ST-T changes.  Recent Labwork: 10/01/2021: BUN 21; Creat 1.00; Platelets 195 10/02/2021: Hemoglobin 11.6; Hemoglobin 11.6; Potassium 3.6; Potassium 3.5; Sodium 141; Sodium 142   Other Studies Reviewed Today:  TEE 09/30/2021:  1. A 62mm Edwards Magna Ease pericardial tissue valve is present in the  aortic position. Procedure date 01/26/2012. There is severe thickening and  calcification of the bioprosthetic leaflet tips along the commissures with  severely restricted leaflet  motion. There is severe bioprosthetic aortic valve stenosis. AVA by 2D  planimetry is 0.63cm2 and 0.71cm2 by continuity, mean gradient 01/28/2012,  peak gradient , Vmax 4.82m/s, DI 0.2. Aortic valve regurgitation is  trivial.   2. Left ventricular ejection fraction, by estimation, is 60 to 65%. The  left ventricle has normal function. The left ventricle has no regional  wall motion abnormalities.   3. Right ventricular systolic function is low normal. The right  ventricular  size is normal.   4. No left atrial/left atrial appendage thrombus was detected.   5. The mitral valve is normal in structure. Mild mitral valve  regurgitation. No evidence of mitral stenosis.   6. There is a 21 mm Edwards valve present in the aortic position.  Procedure Date: 01/26/2012. There is severe bioprosthetic aortic valve  stenosis as detailed above. Trivial aortic regurgitation.   7. Aortic dilatation noted. There is mild dilatation of the ascending  aorta, measuring 38 mm.   8. The patient is status-post PFO closure with no residual shunting noted  on color doppler.   Cardiac catheterization 10/02/2021: Patent coronary arteries with mild luminal irregularities, no significant stenoses Known severe bioprosthetic aortic stenosis Normal right heart pressures   Recommend: Cardiac and peripheral CTA studies, cardiac surgical consultations to review treatment options (redo surgery versus valve-in-valve TAVR)  Coronary CTA 10/15/2021: FINDINGS: Prosthetic Valve: There is a 21 mm 10/17/2021  valve with hypo-attenuated leaflet thickening of all three cusps, starting at the base and up to 50% of leaflet involvement   Level of coronary ostia above stent posts: Yes   LVOT calcification: None   Annular calcification: None   Perimembranous septal diameter: 9 mm   Mitral Valve: No appreciable calcium   Aortic Annulus Measurements   Major annulus diameter: 22 mm   Minor annulus diameter:21 mm   Annular perimeter: 65 mm   Annular area: 3.3 cm2   Aortic Root Measurements   Sinotubular Junction: 34 mm   Ascending Thoracic Aorta: 46.5 mm   Aortic Arch: 31 mm   Descending Thoracic Aorta: 30 mm   Sinus of Valsalva Measurements:   Right coronary cusp width: 34 mm   Left coronary cusp width: 33 mm   Non coronary cusp width: 34 mm   Mean diameter: 34 mm   Coronary Artery Height above prosthetic valve base:   Left Main: 10 mm   Left SoV height: 20 mm    Right Coronary: 14 mm   Right SoV height: 20 mm   Optimum Fluoroscopic Angle for Delivery: LAO 0, CRA 14   Valves for structural team consideration: Sapien Valve 20 mm, Sapien Valve 23 mm (True Balloon size 22 mm, this is a fracturable valve).   Non TAVR Valve Findings:   Coronary Arteries: Small RCA, normal coronary origins   Coronary Calcium Score:   Left main: 5   Left anterior descending artery: 175   Left circumflex artery: 33   Right coronary artery: 2   Total: 215   Percentile: 89th for age, sex, and race matched control.   Status post PFO closure without contrast extravasation across interatrial septum.   Normal Pulmonary vein anatomy.   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   Normal main pulmonary artery diameter.   Aortic atherosclerosis noted.   Prior median sternotomy noted. At the level of the aortic valve the RV pericardium is adjacent to the sternum.   IMPRESSION: 1. Prosthetic Aortic stenosis. Findings pertinent to TAVR procedure are detailed above.   2. Patient's total coronary artery calcium score is 215, which is 89th percentile for subjects of the same age, gender, and race based populations.   3. Moderate ascending thoracic aortic aneurysm, 46.5 mm maximal diameter. Consider serial follow up to evaluate expansion if clinically indicated.   4.  Aortic atherosclerosis.  Assessment and Plan:  1.  Severe prosthetic aortic stenosis, 21 mm [facility] Ease pericardial tissue valve in place.  She has undergone baseline work-up including TEE, cardiac catheterization, and coronary CTA with pending consultation per Dr. Laneta Simmers on January 16 regarding mode of valve replacement (valve in valve TAVR versus SAVR).  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  2.  Minor coronary luminal irregularities at cardiac catheterization.  3.  Essential hypertension, on Maxide.  Blood pressure is  normal today.  Medication Adjustments/Labs and Tests Ordered: Current medicines are reviewed at length with the patient today.  Concerns regarding medicines are outlined above.   Tests Ordered: No orders of the defined types were placed in this encounter.   Medication Changes: No orders of the defined types were placed in this encounter.   Disposition:  Follow up  3 months.  Signed, Jonelle Sidle, MD, [facility] 11/06/2021 3:39 PM    [facility] [address] [place], [place], [place] [address] Phone: [phone]; Fax: [phone]

## 2021-11-09 ENCOUNTER — Encounter: Payer: Self-pay | Admitting: Physician Assistant

## 2021-11-16 ENCOUNTER — Encounter: Payer: Medicare Other | Admitting: Surgery

## 2021-11-20 ENCOUNTER — Institutional Professional Consult (permissible substitution): Payer: Medicare Other | Admitting: Surgery

## 2021-11-20 ENCOUNTER — Other Ambulatory Visit: Payer: Self-pay

## 2021-11-20 VITALS — BP 123/80 | HR 106 | Resp 20 | Ht 66.0 in | Wt 177.0 lb

## 2021-11-20 DIAGNOSIS — I350 Nonrheumatic aortic (valve) stenosis: Secondary | ICD-10-CM

## 2021-11-20 NOTE — Progress Notes (Signed)
Pre Surgical Assessment: 5 M Walk Test  37M=16.59ft  5 Meter Walk Test- trial 1: 5.06 seconds 5 Meter Walk Test- trial 2: 4.05 seconds 5 Meter Walk Test- trial 3: 4.13 seconds 5 Meter Walk Test Average: 4.41 seconds

## 2021-11-22 ENCOUNTER — Encounter: Payer: Self-pay | Admitting: Surgery

## 2021-11-22 NOTE — Progress Notes (Signed)
Patient ID: Courtney Harvey, female   DOB: 10-29-1956, 66 y.o.   MRN: [id number]  [facility]   MULTIDISCIPLIN[facility] VALVE CLINIC         [address]       [place] [address]             [phone]          CARDIOTHORACIC SURGERY CONSULTATION REPORT  PCP is Leeanne Rio, MD Referring Provider is Sherren Mocha, MD Primary Cardiologist is Rozann Lesches, MD  Reason for consultation:  Severe prosthetic aortic valve stenosis  HPI:  The patient is a 66 year old Jehovah's Witness with a history of type 2 diabetes, hypertension, and bicuspid aortic valve disease who underwent aortic valve replacement using a 21 mm Edwards magna-ease pericardial valve and closure of a PFO by Dr. Roxy Manns in 2013.  She now presents with increasing shortness of breath with exertion and chest discomfort as well as exertional fatigue and tiredness.  She does report some episodes of dizziness when getting up from a lying position and sometimes with exertion.  She denies orthopnea.  She has had no peripheral edema.  A 2D echocardiogram on 09/11/2021 showed a mean gradient across aortic valve of 61 mmHg with a peak gradient of 104.2 mmHg and a valve area by VTI of 0.83 cm.  Left ventricular ejection fraction was 70 to 75%.  A nuclear stress test on 09/23/2021 showed findings consistent with prior inferior MI with moderate peri-infarct ischemia.  Ejection fraction was 69%.  This is felt to be a low to intermediate risk study.  A TEE was done on 09/30/2021 which showed severe prosthetic aortic valve stenosis.  Valve area by planimetry was 0.63 cm with a mean gradient of 55 mmHg.  Peak gradient was 95 mmHg.  Dimensionless index of 0.2.  Aortic regurgitation was trivial.  There was severe thickening and calcification of the leaflets along the commissures with severely restricted leaflet motion.  Ejection fraction was 60 to 65%.  Cardiac catheterization on 10/02/2021 showed patent coronary  arteries with minimal luminal irregularities.  Right heart pressures were normal.  The patient is here today accompanied by her minister.  She is widowed.  She has a remote smoker.  Past Medical History:  Diagnosis Date   Chronic diastolic heart failure ([place])    Essential hypertension    Glaucoma    H/O hiatal hernia    History of kidney stones    Recurrent upper respiratory infection (URI)    S/P aortic valve replacement 01/26/2012   59mm [facility] Ease pericardial tissue valve   S/P patent foramen ovale closure 01/26/2012   PFO found on TEE prior to AVR. Performed at the time of aortic valve replacement   Type 2 diabetes mellitus [facility])     Past Surgical History:  Procedure Laterality Date   AORTIC VALVE REPLACEMENT  01/26/2012   Procedure: AORTIC VALVE REPLACEMENT (AVR);  Surgeon: Rexene Alberts, MD;  Location: [facility];  Service: Open Heart Surgery;  Laterality: N/A;   CARDIAC CATHETERIZATION  2005 and 2013   Friday March 8th       pcp Dr tapper  in [place] [facility] N/A 09/06/2018   Procedure: DILATATION AND CURETTAGE /HYSTEROSCOPY  ENDOMETRIAL POLYPECTOMY;  Surgeon: Florian Buff, MD;  Location: [facility];  Service: Gynecology;  Laterality: N/A;   LEFT AND RIGHT HEART CATHETERIZATION WITH CORONARY ANGIOGRAM N/A 01/07/2012  Procedure: LEFT AND RIGHT HEART CATHETERIZATION WITH CORONARY ANGIOGRAM;  Surgeon: Josue Hector, MD;  Location: [facility] CATH LAB;  Service: Cardiovascular;  Laterality: N/A;   RIGHT HEART CATH AND CORONARY ANGIOGRAPHY N/A 10/02/2021   Procedure: RIGHT HEART CATH AND CORONARY ANGIOGRAPHY;  Surgeon: Sherren Mocha, MD;  Location: [facility] CV LAB;  Service: Cardiovascular;  Laterality: N/A;   TEE WITHOUT CARDIOVERSION  01/10/2012   Procedure: TRANSESOPHAGEAL ECHOCARDIOGRAM (TEE);  Surgeon: Fay Records, MD;  Location: [facility] ENDOSCOPY;  Service: Cardiovascular;  Laterality: N/A;  TEE call Trish in am for time   TEE WITHOUT CARDIOVERSION N/A 09/30/2021    Procedure: TRANSESOPHAGEAL ECHOCARDIOGRAM (TEE) WITH 3D IMAGING;  Surgeon: Freada Bergeron, MD;  Location: [facility] ENDOSCOPY;  Service: Cardiovascular;  Laterality: N/A;    Family History  Problem Relation Age of Onset   Lung cancer Father    Uterine cancer Mother    Hypertension Brother    Cancer Sister    Hypertension Son     Social History   Socioeconomic History   Marital status: Widowed    Spouse name: Not on file   Number of children: 2   Years of education: Not on file   Highest education level: Not on file  Occupational History    Employer: [employer]  Tobacco Use   Smoking status: Former    Packs/day: 1.00    Years: 40.00    Pack years: 40.00    Types: Cigarettes    Quit date: 01/06/2012    Years since quitting: 9.8   Smokeless tobacco: Never  Vaping Use   Vaping Use: Never used  Substance and Sexual Activity   Alcohol use: No    Alcohol/week: 0.0 standard drinks   Drug use: No   Sexual activity: Not Currently    Birth control/protection: Post-menopausal  Other Topics Concern   Not on file  Social History Narrative   She is from [place], [place]. She is widowed. Her husband committed suicide sometime last year, but she feels that she has dealt with it. She currently lives alone and works at [employer] Strain: Not on file  Food Insecurity: Not on file  Transportation Needs: Not on file  Physical Activity: Not on file  Stress: Not on file  Social Connections: Not on file  Intimate Partner Violence: Not on file    Prior to Admission medications   Medication Sig Start Date End Date Taking? Authorizing Provider  acetaminophen (TYLENOL) 500 MG tablet Take 1,000 mg by mouth every 6 (six) hours as needed for moderate pain.   Yes [provider]  cetirizine (ZYRTEC) 10 MG tablet Take 10 mg by mouth daily.   Yes [provider]  fluticasone (FLONASE) 50 MCG/ACT nasal spray Place 1  spray into both nostrils daily as needed for allergies or rhinitis.   Yes [provider]  metFORMIN (GLUCOPHAGE-XR) 500 MG 24 hr tablet Take 500 mg by mouth daily before breakfast. 03/09/21  Yes [provider]  nicotine polacrilex (NICORETTE) 2 MG gum Take 2 mg by mouth as needed for smoking cessation.   Yes [provider]  triamterene-hydrochlorothiazide (MAXZIDE-25) 37.5-25 MG tablet Take 1 tablet by mouth daily. 08/09/18  Yes [provider]    Current Outpatient Medications  Medication Sig Dispense Refill   acetaminophen (TYLENOL) 500 MG tablet Take 1,000 mg by mouth every 6 (six) hours as needed for moderate pain.  cetirizine (ZYRTEC) 10 MG tablet Take 10 mg by mouth daily.     fluticasone (FLONASE) 50 MCG/ACT nasal spray Place 1 spray into both nostrils daily as needed for allergies or rhinitis.     metFORMIN (GLUCOPHAGE-XR) 500 MG 24 hr tablet Take 500 mg by mouth daily before breakfast.     nicotine polacrilex (NICORETTE) 2 MG gum Take 2 mg by mouth as needed for smoking cessation.     triamterene-hydrochlorothiazide (MAXZIDE-25) 37.5-25 MG tablet Take 1 tablet by mouth daily.  1   Current Facility-Administered Medications  Medication Dose Route Frequency Provider Last Rate Last Admin   sodium chloride flush (NS) 0.9 % injection 3 mL  3 mL Intravenous Q12H Satira Sark, MD        Allergies  Allergen Reactions   Peanut-Containing Drug Products Other (See Comments)    headache   Wellbutrin [Bupropion Hcl] Itching      Review of Systems:   General:  normal appetite, + decreased energy, no weight gain, no weight loss, no fever  Cardiac:  + chest pain with exertion, no chest pain at rest, +SOB with mild exertion, + resting SOB, no PND, no orthopnea, no palpitations, no arrhythmia, no atrial fibrillation, no LE edema, + dizzy spells, no syncope  Respiratory:  + shortness of breath, no home oxygen, no productive cough, no dry cough, no  bronchitis, no wheezing, no hemoptysis, no asthma, no pain with inspiration or cough, no sleep apnea, no CPAP at night  GI:   no difficulty swallowing, no reflux, no frequent heartburn, no hiatal hernia, no abdominal pain, no constipation, no diarrhea, no hematochezia, no hematemesis, no melena  GU:   no dysuria,  no frequency, no urinary tract infection, no hematuria, no kidney stones, no kidney disease  Vascular:  no pain suggestive of claudication, no pain in feet, no leg cramps, no varicose veins, no DVT, no non-healing foot ulcer  Neuro:   no stroke, no TIA's, no seizures, + headaches, no temporary blindness one eye,  no slurred speech, no peripheral neuropathy, no chronic pain, no instability of gait, no memory/cognitive dysfunction  Musculoskeletal: no arthritis, no joint swelling, no myalgias, no difficulty walking, no mobility   Skin:   no rash, no itching, no skin infections, no pressure sores or ulcerations  Psych:   no anxiety, no depression, no nervousness, no unusual recent stress  Eyes:   + blurry vision, no floaters, no recent vision changes, + wears glasses  ENT:   no hearing loss, no loose or painful teeth, no dentures, last saw dentist 11/04/21  Hematologic:  no easy bruising, no abnormal bleeding, no clotting disorder, no frequent epistaxis  Endocrine:  + diabetes, does not check CBG's at home     Physical Exam:   BP 123/80 (BP Location: Left Arm, Patient Position: Sitting)    Pulse (!) 106    Resp 20    Ht 5\' 6"  (1.676 m)    Wt 177 lb (80.3 kg)    LMP  (LMP Unknown)    SpO2 96% Comment: RA   BMI 28.57 kg/m   General:  well-appearing  HEENT:  Unremarkable, NCAT, PERLA, EOMI  Neck:   no JVD, no bruits, no adenopathy   Chest:   clear to auscultation, symmetrical breath sounds, no wheezes, no rhonchi   CV:   RRR, 3/6 systolic murmur RSB, no diastolic murmur  Abdomen:  soft, non-tender, no masses   Extremities:  warm, well-perfused, pulses palpable at ankle,  lower  extremity  edema  Rectal/GU  Deferred  Neuro:   Grossly non-focal and symmetrical throughout  Skin:   Clean and dry, no rashes, no breakdown  Diagnostic Tests:    TRANSESOPHOGEAL ECHO REPORT         Patient Name:   ARRAYAH SOPP Date of Exam: 09/30/2021  Medical Rec #:  [id number]        Height:       66.0 in  Accession #:    [id number]       Weight:       170.0 lb  Date of Birth:  10-10-1956        BSA:          1.866 m  Patient Age:    40 years         BP:           93/69 mmHg  Patient Gender: F                HR:           70 bpm.  Exam Location:  Inpatient   Procedure: Transesophageal Echo, 3D Echo, Cardiac Doppler and Color  Doppler   Indications:     Increased Gradient of Prosthetic Aortic Valve     History:         Patient has prior history of Echocardiogram examinations,  most                   recent 09/11/2021. Aortic Valve Disease; Risk  Factors:Diabetes                   and Hypertension.                   Aortic Valve: 21 mm Edwards valve is present in the  aortic                   position. Procedure Date: 01/26/2012.     Sonographer:     Bernadene Person RDCS  Referring Phys:  [id number] Verta Ellen.  Diagnosing Phys: Gwyndolyn Kaufman MD   PROCEDURE: After discussion of the risks and benefits of a TEE, an  informed consent was obtained from the patient. The transesophogeal probe  was passed without difficulty through the esophogus of the patient. Local  oropharyngeal anesthetic was provided  with Cetacaine. Sedation performed by different physician. The patient was  monitored while under deep sedation. Anesthestetic sedation was provided  intravenously by Anesthesiology: 291.36mg  of Propofol, 80mg  of Lidocaine.  The patient developed no  complications during the procedure.   IMPRESSIONS     1. A 11mm Edwards Magna Ease pericardial tissue valve is present in the  aortic position. Procedure date 01/26/2012. There is severe thickening and  calcification of the  bioprosthetic leaflet tips along the commissures with  severely restricted leaflet  motion. There is severe bioprosthetic aortic valve stenosis. AVA by 2D  planimetry is 0.63cm2 and 0.71cm2 by continuity, mean gradient 49mmHg,  peak gradient 47mmHg, Vmax 4.53m/s, DI 0.2. Aortic valve regurgitation is  trivial.   2. Left ventricular ejection fraction, by estimation, is 60 to 65%. The  left ventricle has normal function. The left ventricle has no regional  wall motion abnormalities.   3. Right ventricular systolic function is low normal. The right  ventricular size is normal.   4. No left atrial/left atrial appendage thrombus was detected.   5. The mitral valve is normal in structure. Mild mitral  valve  regurgitation. No evidence of mitral stenosis.   6. There is a 21 mm Edwards valve present in the aortic position.  Procedure Date: 01/26/2012. There is severe bioprosthetic aortic valve  stenosis as detailed above. Trivial aortic regurgitation.   7. Aortic dilatation noted. There is mild dilatation of the ascending  aorta, measuring 38 mm.   8. The patient is status-post PFO closure with no residual shunting noted  on color doppler.   FINDINGS   Left Ventricle: Left ventricular ejection fraction, by estimation, is 60  to 65%. The left ventricle has normal function. The left ventricle has no  regional wall motion abnormalities. The left ventricular internal cavity  size was normal in size.   Right Ventricle: The right ventricular size is normal. No increase in  right ventricular wall thickness. Right ventricular systolic function is  low normal.   Left Atrium: Left atrial size was normal in size. No left atrial/left  atrial appendage thrombus was detected.   Right Atrium: Right atrial size was normal in size.   Pericardium: There is no evidence of pericardial effusion.   Mitral Valve: The mitral valve is normal in structure. There is mild  thickening of the mitral valve leaflet(s).  Mild mitral valve  regurgitation. No evidence of mitral valve stenosis.   Tricuspid Valve: The tricuspid valve is normal in structure. Tricuspid  valve regurgitation is trivial.   Aortic Valve: A 11mm Edwards Magna Ease pericardial tissue valve is  present in the aortic position. There is severe thickening and  calcification of the bioprosthetic leaflet tips along the commisural lines  with severely restricted leaflet motion. There  is severe bioprosthetic aortic valve stenosis. AVA by 2D planimetry is  0.63cm2 and 0.71cm2 by continuity, mean gradient , peak gradient  , Vmax 4.32m/s, DI 0.2. The aortic valve has been repaired/replaced.  Aortic valve regurgitation is trivial.   Aortic valve mean gradient measures 55.0 mmHg. Aortic valve peak gradient  measures 96.0 mmHg. Aortic valve area, by VTI measures 0.67 cm. There is  a 21 mm Edwards valve present in the aortic position. Procedure Date:  01/26/2012.   Pulmonic Valve: The pulmonic valve was normal in structure. Pulmonic valve  regurgitation is trivial.   Aorta: The aortic root is normal in size and structure and aortic  dilatation noted. There is mild dilatation of the ascending aorta,  measuring 38 mm.   IAS/Shunts: No atrial level shunt detected by color flow Doppler.      LEFT VENTRICLE  PLAX 2D  LVOT diam:     2.16 cm  LV SV:         79  LV SV Index:   43  LVOT Area:     3.67 cm      AORTIC VALVE  AV Area (Vmax):    0.66 cm  AV Area (Vmean):   0.67 cm  AV Area (VTI):     0.67 cm  AV Vmax:           490.00 cm/s  AV Vmean:          341.000 cm/s  AV VTI:            1.180 m  AV Peak Grad:      96.0 mmHg  AV Mean Grad:      55.0 mmHg  LVOT Vmax:         88.00 cm/s  LVOT Vmean:        62.200 cm/s  LVOT VTI:  0.216 m  LVOT/AV VTI ratio: 0.18     AORTA  Ao Root diam: 3.54 cm  Ao Asc diam:  3.79 cm      SHUNTS  Systemic VTI:  0.22 m  Systemic Diam: 2.16 cm   Laurance Flatten MD   Electronically signed by Laurance Flatten MD  Signature Date/Time: 09/30/2021/10:35:32 AM         Final (Updated)    Physicians  Panel Physicians Referring Physician Case Authorizing Physician  [name], MD (Primary)     Procedures  RIGHT HEART CATH AND CORONARY ANGIOGRAPHY   Conclusion      There is severe aortic valve stenosis.   Patent coronary arteries with mild luminal irregularities, no significant stenoses Known severe bioprosthetic aortic stenosis Normal right heart pressures   Recommend: Cardiac and peripheral CTA studies, cardiac surgical consultations to review treatment options (redo surgery versus valve-in-valve TAVR)   Surgeon Notes    09/30/2021  8:21 AM CV Procedure signed by [name], MD   Procedural Details  Technical Details INDICATION: Severe bioprosthetic aortic stenosis. 66 yo woman with hx of bioprosthetic AVR with a 21 mm Edwards Magna Ease valve in 2013 by Dr Cornelius Moras. She has developed severe, symptomatic, bioprosthetic aortic stenosis and is referred for preop R/L heart cath.   PROCEDURAL DETAILS: There was an indwelling IV in a right antecubital vein. Using normal sterile technique, the IV was changed out for a 5 Fr brachial sheath over a 0.018 inch wire. The right wrist was then prepped, draped, and anesthetized with 1% lidocaine. Using the modified Seldinger technique a 5/6 French Slender sheath was placed in the right radial artery. Intra-arterial verapamil was administered through the radial artery sheath. IV heparin was administered after a JR4 catheter was advanced into the central aorta. A Swan-Ganz catheter was used for the right heart catheterization. Standard protocol was followed for recording of right heart pressures and sampling of oxygen saturations. Fick cardiac output was calculated. Standard Judkins catheters were used for selective coronary angiography. No attempt is made to cross the aortic valve.  There were no  immediate procedural complications. The patient was transferred to the post catheterization recovery area for further monitoring.      Estimated blood loss <50 mL.   During this procedure medications were administered to achieve and maintain moderate conscious sedation while the patient's heart rate, blood pressure, and oxygen saturation were continuously monitored and I was present face-to-face 100% of this time.   Medications (Filter: Administrations occurring from 1405 to 1439 on 10/02/21)  important  Continuous medications are totaled by the amount administered until 10/02/21 1439.   midazolam (VERSED) injection (mg) Total dose:  2 mg Date/Time Rate/Dose/Volume Action   10/02/21 1409 2 mg Given    fentaNYL (SUBLIMAZE) injection (mcg) Total dose:  25 mcg Date/Time Rate/Dose/Volume Action   10/02/21 1409 25 mcg Given    lidocaine (PF) (XYLOCAINE) 1 % injection (mL) Total volume:  4 mL Date/Time Rate/Dose/Volume Action   10/02/21 1414 2 mL Given   1416 2 mL Given    Radial Cocktail/Verapamil only (mL) Total volume:  10 mL Date/Time Rate/Dose/Volume Action   10/02/21 1418 10 mL Given    heparin sodium (porcine) injection (Units) Total dose:  4,000 Units Date/Time Rate/Dose/Volume Action   10/02/21 1423 4,000 Units Given    iohexol (OMNIPAQUE) 350 MG/ML injection (mL) Total volume:  50 mL Date/Time Rate/Dose/Volume Action   10/02/21 1432 50 mL Given    Heparin (Porcine) in NaCl  1000-0.9 UT/500ML-% SOLN (mL) Total volume:  1,000 mL Date/Time Rate/Dose/Volume Action   10/02/21 1432 500 mL Given   1432 500 mL Given    Sedation Time  Sedation Time Physician-1: 20 minutes 58 seconds Contrast  Medication Name Total Dose  iohexol (OMNIPAQUE) 350 MG/ML injection 50 mL   Radiation/Fluoro  Fluoro time: 3.7 (min) DAP: 17820 (mGycm2) Cumulative Air Kerma: 0000000 (mGy) Complications  Complications documented before study signed (10/02/2021  [id number] PM)   No complications  were associated with this study.  Documented by Chana Bode, RN - 10/02/2021  2:38 PM     Coronary Findings  Diagnostic Dominance: Left Left Anterior Descending  The vessel exhibits minimal luminal irregularities. Large vessel that reaches the LV apex, mild irregularities noted. No significant stenosis throughout the LAD territory.    Left Circumflex  The vessel exhibits minimal luminal irregularities. Large, dominant circumflex, supplies OM and left PDA branches without any significant stenoses.    Right Coronary Artery  Vessel is angiographically normal.    Intervention   No interventions have been documented.   Left Heart  Aortic Valve There is severe aortic valve stenosis. The patient has a bioprosthetic aortic valve that functions abnormally.   Coronary Diagrams  Diagnostic Dominance: Left Intervention  Implants     No implant documentation for this case.   Syngo Images   Show images for CARDIAC CATHETERIZATION Images on Long Term Storage   Show images for Dettman, Deneen Noth Link to Procedure Log  Procedure Log    Hemo Data  Flowsheet Row Most Recent Value  Fick Cardiac Output 4.83 L/min  Fick Cardiac Output Index 2.59 (L/min)/BSA  RA A Wave 5 mmHg  RA V Wave 7 mmHg  RA Mean 3 mmHg  RV Systolic Pressure 23 mmHg  RV Diastolic Pressure -1 mmHg  RV EDP 4 mmHg  PA Systolic Pressure 22 mmHg  PA Diastolic Pressure 1 mmHg  PA Mean 12 mmHg  PW A Wave 11 mmHg  PW V Wave 7 mmHg  PW Mean 4 mmHg  AO Systolic Pressure 92 mmHg  AO Diastolic Pressure 57 mmHg  AO Mean 71 mmHg  QP/QS 1  TPVR Index 4.63 HRUI  TSVR Index 27.42 HRUI  PVR SVR Ratio 0.12  TPVR/TSVR Ratio 0.17    ADDENDUM REPORT: 10/16/2021 13:52   CLINICAL DATA:  Aortic Valve pathology with assessment for TAVR   EXAM: Cardiac TAVR CT   TECHNIQUE: The patient was scanned on a Siemens Force [id number] slice scanner. A 120 kV retrospective scan was triggered in the descending thoracic aorta at  111 HU's. Gantry rotation speed was 270 msecs and collimation was .9 mm. No beta blockade or nitro were given. The 3D data set was reconstructed in 5% intervals of the R-R cycle. Systolic and diastolic phases were analyzed on a dedicated work station using MPR, MIP and VRT modes. The patient received 95 cc of contrast.   FINDINGS: Prosthetic Valve: There is a 21 mm [employer] valve with hypo-attenuated leaflet thickening of all three cusps, starting at the base and up to 50% of leaflet involvement   Level of coronary ostia above stent posts: Yes   LVOT calcification: None   Annular calcification: None   Perimembranous septal diameter: 9 mm   Mitral Valve: No appreciable calcium   Aortic Annulus Measurements   Major annulus diameter: 22 mm   Minor annulus diameter:21 mm   Annular perimeter: 65 mm   Annular area: 3.3 cm2  Aortic Root Measurements   Sinotubular Junction: 34 mm   Ascending Thoracic Aorta: 46.5 mm   Aortic Arch: 31 mm   Descending Thoracic Aorta: 30 mm   Sinus of Valsalva Measurements:   Right coronary cusp width: 34 mm   Left coronary cusp width: 33 mm   Non coronary cusp width: 34 mm   Mean diameter: 34 mm   Coronary Artery Height above prosthetic valve base:   Left Main: 10 mm   Left SoV height: 20 mm   Right Coronary: 14 mm   Right SoV height: 20 mm   Optimum Fluoroscopic Angle for Delivery: LAO 0, CRA 14   Valves for structural team consideration: Sapien Valve 20 mm, Sapien Valve 23 mm (True Balloon size 22 mm, this is a fracturable valve).   Non TAVR Valve Findings:   Coronary Arteries: Small RCA, normal coronary origins   Coronary Calcium Score:   Left main: 5   Left anterior descending artery: 175   Left circumflex artery: 33   Right coronary artery: 2   Total: 215   Percentile: 89th for age, sex, and race matched control.   Status post PFO closure without contrast extravasation across interatrial  septum.   Normal Pulmonary vein anatomy.   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   Normal main pulmonary artery diameter.   Aortic atherosclerosis noted.   Prior median sternotomy noted. At the level of the aortic valve the RV pericardium is adjacent to the sternum.   IMPRESSION: 1. Prosthetic Aortic stenosis. Findings pertinent to TAVR procedure are detailed above.   2. Patient's total coronary artery calcium score is 215, which is 89th percentile for subjects of the same age, gender, and race based populations.   3. Moderate ascending thoracic aortic aneurysm, 46.5 mm maximal diameter. Consider serial follow up to evaluate expansion if clinically indicated.   4.  Aortic atherosclerosis.   RECOMMENDATIONS:   Coronary artery calcium (CAC) score is a strong predictor of incident coronary heart disease (CHD) and provides predictive information beyond traditional risk factors. CAC scoring is reasonable to use in the decision to withhold, postpone, or initiate statin therapy in intermediate-risk or selected borderline-risk asymptomatic adults (age 55-75 years and LDL-C >=70 to <190 mg/dL) who do not have diabetes or established atherosclerotic cardiovascular disease (ASCVD).* In intermediate-risk (10-year ASCVD risk >=7.5% to <20%) adults or selected borderline-risk (10-year ASCVD risk >=5% to <7.5%) adults in whom a CAC score is measured for the purpose of making a treatment decision the following recommendations have been made:   If CAC = 0, it is reasonable to withhold statin therapy and reassess in 5 to 10 years, as long as higher risk conditions are absent (diabetes mellitus, family history of premature CHD in first degree relatives (males <55 years; females <65 years), cigarette smoking, LDL >=190 mg/dL or other independent risk factors).   If CAC is 1 to 99, it is reasonable to initiate statin therapy for patients >=55 years of  age.   If CAC is >=100 or >=75th percentile, it is reasonable to initiate statin therapy at any age.   Cardiology referral should be considered for patients with CAC scores >=400 or >=75th percentile.   *2018 AHA/ACC/AACVPR/AAPA/ABC/ACPM/ADA/AGS/APhA/ASPC/NLA/PCNA Guideline on the Management of Blood Cholesterol: A Report of the American College of Cardiology/American Heart Association Task Force on Clinical Practice Guidelines. J Am Coll Cardiol. 2019;73(24):3168-3209.   [employer]  [employer]     Electronically Signed   By: Rudean Haskell  M.D.   On: 10/16/2021 13:52    Addended by Werner Lean, MD on 10/16/2021  1:54 PM   Study Result  Narrative & Impression  EXAM: OVER-READ INTERPRETATION  CT CHEST   The following report is an over-read performed by radiologist Dr. Vinnie Langton of [facility], [place] on 10/15/2021. This over-read does not include interpretation of cardiac or coronary anatomy or pathology. The coronary calcium score/coronary CTA interpretation by the cardiologist is attached.   COMPARISON:  Cardiac CT 01/11/2012.   FINDINGS: Extracardiac findings will be described separately under dictation for contemporaneously obtained CTA chest, abdomen and pelvis.   IMPRESSION: Please see separate dictation for contemporaneously obtained CTA chest, abdomen and pelvis dated 10/15/2021 for full description of relevant extracardiac findings.   Electronically Signed: By: Vinnie Langton M.D. On: 10/15/2021 11:35      Narrative & Impression  CLINICAL DATA:  66 year old female with history of severe aortic stenosis. Preprocedural study prior to potential transcatheter aortic valve replacement (TAVR) procedure.   EXAM: CT ANGIOGRAPHY CHEST, ABDOMEN AND PELVIS   TECHNIQUE: Multidetector CT imaging through the chest, abdomen and pelvis was performed using the standard protocol during bolus administration of intravenous contrast.  Multiplanar reconstructed images and MIPs were obtained and reviewed to evaluate the vascular anatomy.   CONTRAST:  38mL OMNIPAQUE IOHEXOL 350 MG/ML SOLN   COMPARISON:  Cardiac CT 01/11/2012. CT of the abdomen and pelvis 01/11/2012.   FINDINGS: CTA CHEST FINDINGS   Cardiovascular: Heart size is normal. There is no significant pericardial fluid, thickening or pericardial calcification. There is aortic atherosclerosis, as well as atherosclerosis of the great vessels of the mediastinum and the coronary arteries, including calcified atherosclerotic plaque in the left anterior descending and left circumflex coronary arteries. Aneurysmal dilatation of the ascending thoracic aorta which measures up to 4.6 cm in diameter. Status post median sternotomy for aortic valve replacement with what appears to be a stented bioprosthesis. There is some calcification of the bioprosthetic valve cusps.   Mediastinum/Lymph Nodes: No pathologically enlarged mediastinal or hilar lymph nodes. Hilar esophagus is unremarkable in appearance. No axillary lymphadenopathy.   Lungs/Pleura: Mild paraseptal emphysema. No acute consolidative airspace disease. No pleural effusions. No suspicious appearing pulmonary nodules or masses are noted.   Musculoskeletal/Soft Tissues: Median sternotomy wires. There are no aggressive appearing lytic or blastic lesions noted in the visualized portions of the skeleton.   CTA ABDOMEN AND PELVIS FINDINGS   Hepatobiliary: Diffuse low attenuation throughout the hepatic parenchyma, indicative of a background of hepatic steatosis. No suspicious cystic or solid hepatic lesions. No intra or extrahepatic biliary ductal dilatation. Gallbladder is normal in appearance.   Pancreas: No pancreatic mass. No pancreatic ductal dilatation. No pancreatic or peripancreatic fluid collections or inflammatory changes.   Spleen: Unremarkable.   Adrenals/Urinary Tract: Bilateral kidneys and  bilateral adrenal glands are normal in appearance. No hydroureteronephrosis. Urinary bladder is normal in appearance.   Stomach/Bowel: The appearance of the stomach is normal. There is no pathologic dilatation of small bowel or colon. Normal appendix.   Vascular/Lymphatic: Aortic atherosclerosis, without evidence of aneurysm or dissection in the abdominal or pelvic vasculature. Vascular findings and measurements pertinent to potential TAVR procedure, as detailed below. No lymphadenopathy noted in the abdomen or pelvis.   Reproductive: Status post hysterectomy. Ovaries are not confidently identified may be surgically absent or atrophic.   Other: No significant volume of ascites.  No pneumoperitoneum.   Musculoskeletal: There are no aggressive appearing lytic or blastic lesions noted in the visualized  portions of the skeleton.   VASCULAR MEASUREMENTS PERTINENT TO TAVR:   AORTA:   Minimal Aortic Diameter-17 x 15 mm   Severity of Aortic Calcification-mild   RIGHT PELVIS:   Right Common Iliac Artery -   Minimal Diameter-7.1 x 8.6 mm   Tortuosity-mild   Calcification-moderate   Right External Iliac Artery -   Minimal Diameter-6.8 x 6.3 mm   Tortuosity-mild   Calcification-mild   Right Common Femoral Artery -   Minimal Diameter-6.6 x 6.6 mm   Tortuosity-mild   Calcification-none   LEFT PELVIS:   Left Common Iliac Artery -   Minimal Diameter-9.3 x 9.0 mm   Tortuosity-moderate   Calcification-moderate   Left External Iliac Artery -   Minimal Diameter-6.9 x 6.1 mm   Tortuosity-moderate   Calcification-none   Left Common Femoral Artery -   Minimal Diameter-6.7 x 7.1 mm   Tortuosity-mild   Calcification-mild   Review of the MIP images confirms the above findings.   IMPRESSION: 1. Vascular findings and measurements pertinent to potential TAVR procedure, as detailed above. 2. Status post aortic valve replacement with stented bioprosthesis which  demonstrates calcifications of the valve cusps, compatible with reported clinical history of severe aortic stenosis. 3. Aortic atherosclerosis, in addition to 2 vessel coronary artery disease. There is also aneurysmal dilatation of the ascending thoracic aorta which measures up to 4.6 cm in diameter. Ascending thoracic aortic aneurysm. Recommend semi-annual imaging followup by CTA or MRA and referral to cardiothoracic surgery if not already obtained. This recommendation follows 2010 ACCF/AHA/AATS/ACR/ASA/SCA/SCAI/SIR/STS/SVM Guidelines for the Diagnosis and Management of Patients With Thoracic Aortic Disease. Circulation. 2010; 121ML:4928372. Aortic aneurysm NOS (ICD10-I71.9). 4. Hepatic steatosis. 5. Additional incidental findings, as above.     Electronically Signed   By: Vinnie Langton M.D.   On: 10/15/2021 12:09     Impression:  This 66 year old woman has stage D, severe, symptomatic bioprosthetic aortic valve stenosis with New York Heart Association class II symptoms of exertional fatigue and shortness of breath consistent with chronic diastolic congestive heart failure.  I have personally reviewed her TEE, cardiac catheterization, and CTA studies.  TEE from 09/30/2021 shows a 21 mm pericardial valve with severe thickening and calcification of the leaflets and severely restricted motion.  The aortic valve area by 2D planimetry is 0.63 cm and 0.71 cm by continuity.  The mean gradient is 55 mmHg with a peak gradient of 95 mmHg.  Dimensionless index is 0.2.  There is mild mitral valve regurgitation.  Left ventricular ejection fraction is 60 to 65%.  Cardiac catheterization shows no coronary disease.  CTA of her chest shows a 4.6 cm fusiform ascending aortic aneurysm.  This was noted to be 3.7 cm on gated cardiac CT done in March 2013 prior to her initial aortic valve replacement.  She has not had any studies since then.  She did have a bicuspid aortic valve.  I agree that redo aortic  valve replacement is indicated in this patient for relief of her symptoms and to prevent progressive left ventricular deterioration.  Given her relatively young age I think the best long-term option for her would be open surgical redo aortic valve replacement and replacement of her ascending aortic aneurysm.  This may improve her options long-term if she developed recurrent prosthetic aortic valve deterioration.  It would also decrease the risk of aortic dissection and progressive enlargement of the ascending aortic aneurysm.  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  Therefore I would not consider her a candidate for redo aortic valve replacement and especially replacement of her aortic valve and ascending aneurysm due to the risk of requiring blood products for that type of surgery.  I think transcatheter valve in valve aortic valve replacement would be a reasonable option under the circumstances although it may limit her options long-term if that valve deteriorates and she may still have progressive enlargement of her ascending aortic aneurysm.  I discussed all this with her and answered her questions.  The patient was counseled at length regarding treatment alternatives for management of severe symptomatic bioprosthetic aortic stenosis. The risks and benefits of surgical intervention has been discussed in detail. Long-term prognosis with medical therapy was discussed. Alternative approaches such as conventional surgical aortic valve replacement, transcatheter aortic valve replacement, and palliative medical therapy were compared and contrasted at length. This discussion was placed in the context of the patient's own specific clinical presentation and past medical history. All of her questions have been addressed.   Following the decision to proceed with transcatheter aortic valve replacement, a discussion was held regarding what types of management strategies  would be attempted intraoperatively in the event of life-threatening complications, including whether or not the patient would be considered a candidate for the use of cardiopulmonary bypass and/or conversion to open sternotomy for attempted surgical intervention.  I would not consider her a candidate for emergent sternotomy to manage any intraoperative complications due to her previous sternotomy for aortic valve replacement and refusal of any blood product transfusion.  The patient is aware of the fact that transient use of cardiopulmonary bypass may be necessary. The patient has been advised of a variety of complications that might develop including but not limited to risks of death, stroke, paravalvular leak, aortic dissection or other major vascular complications, aortic annulus rupture, device embolization, cardiac rupture or perforation, mitral regurgitation, acute myocardial infarction, arrhythmia, heart block or bradycardia requiring permanent pacemaker placement, congestive heart failure, respiratory failure, renal failure, pneumonia, infection, other late complications related to structural valve deterioration or migration, or other complications that might ultimately cause a temporary or permanent loss of functional independence or other long term morbidity. The patient provides full informed consent for the procedure as described and all questions were answered.      Plan:  She will be scheduled for transfemoral transcatheter valve in valve aortic valve replacement.   Gaye Pollack, MD 11/20/2021

## 2021-11-23 ENCOUNTER — Encounter: Payer: Self-pay | Admitting: Surgery

## 2021-11-24 ENCOUNTER — Other Ambulatory Visit: Payer: Self-pay

## 2021-11-24 DIAGNOSIS — I350 Nonrheumatic aortic (valve) stenosis: Secondary | ICD-10-CM

## 2021-11-26 NOTE — Progress Notes (Signed)
Surgical Instructions    Your procedure is scheduled on Tuesday January 31st .  Report to [facility] Main Entrance "A" at 7:15 A.M., then check in with the Admitting office.  Call this number if you have problems the morning of surgery:  [phone]   If you have any questions prior to your surgery date call [phone]: Open Monday-Friday 8am-4pm    Remember:  Do not eat or drink after midnight the night before your surgery     Take these medicines the morning of surgery with A SIP OF WATER  NONE  STOP now taking any  Aleve, Naproxen, Ibuprofen, Motrin, Advil, Goody's, BC's, all herbal medications, fish oil, and all non-prescription vitamins.   Stop taking Metformin on 1/29 (Sunday). You will take your last dose on 1/28 (Saturday). Continue taking all other medications without change through the day before surgery. On the morning of surgery do not take any medications   WHAT DO I DO ABOUT MY DIABETES MEDICATION?   Do not take oral diabetes medicines (Metformin) the morning of surgery.  Take your last dose of Metformin Saturday 28th. Then hold until after surgery.    HOW TO MANAGE YOUR DIABETES BEFORE AND AFTER SURGERY  Why is it important to control my blood sugar before and after surgery? Improving blood sugar levels before and after surgery helps healing and can limit problems. A way of improving blood sugar control is eating a healthy diet by:  Eating less sugar and carbohydrates  Increasing activity/exercise  Talking with your doctor about reaching your blood sugar goals High blood sugars (greater than 180 mg/dL) can raise your risk of infections and slow your recovery, so you will need to focus on controlling your diabetes during the weeks before surgery. Make sure that the doctor who takes care of your diabetes knows about your planned surgery including the date and location.  How do I manage my blood sugar before surgery? Check your blood sugar at least 4 times a  day, starting 2 days before surgery, to make sure that the level is not too high or low.  Check your blood sugar the morning of your surgery when you wake up and every 2 hours until you get to the Short Stay unit.  If your blood sugar is less than 70 mg/dL, you will need to treat for low blood sugar: Do not take insulin. Treat a low blood sugar (less than 70 mg/dL) with  cup of clear juice (cranberry or apple), 4 glucose tablets, OR glucose gel. Recheck blood sugar in 15 minutes after treatment (to make sure it is greater than 70 mg/dL). If your blood sugar is not greater than 70 mg/dL on recheck, call [phone] for further instructions. Report your blood sugar to the short stay nurse when you get to Short Stay.  If you are admitted to the hospital after surgery: Your blood sugar will be checked by the staff and you will probably be given insulin after surgery (instead of oral diabetes medicines) to make sure you have good blood sugar levels. The goal for blood sugar control after surgery is 80-180 mg/dL.    After your COVID test   You are not required to quarantine however you are required to wear a well-fitting mask when you are out and around people not in your household.  If your mask becomes wet or soiled, replace with a new one.  Wash your hands often with soap and water for 20 seconds or clean your hands with  an alcohol-based hand sanitizer that contains at least 60% alcohol.  Do not share personal items.  Notify your provider: if you are in close contact with someone who has COVID  or if you develop a fever of 100.4 or greater, sneezing, cough, sore throat, shortness of breath or body aches.           Do not wear jewelry or makeup Do not wear lotions, powders, perfumes, or deodorant. Do not shave 48 hours prior to surgery.   Do not bring valuables to the hospital. DO Not wear nail polish, gel polish, artificial nails, or any other type of covering on natural nails  (fingers and toes) If you have artificial nails or gel coating that need to be removed by a nail salon, please have this removed prior to surgery. Artificial nails or gel coating may interfere with anesthesia's ability to adequately monitor your vital signs.             [facility] is not responsible for any belongings or valuables.  Do NOT Smoke (Tobacco/Vaping)  24 hours prior to your procedure  If you use a CPAP at night, you may bring your mask for your overnight stay.   Contacts, glasses, hearing aids, dentures or partials may not be worn into surgery, please bring cases for these belongings   For patients admitted to the hospital, discharge time will be determined by your treatment team.   Patients discharged the day of surgery will not be allowed to drive home, and someone needs to stay with them for 24 hours.  NO VISITORS WILL BE ALLOWED IN PRE-OP WHERE PATIENTS ARE PREPPED FOR SURGERY.  ONLY 1 SUPPORT PERSON MAY BE PRESENT IN THE WAITING ROOM WHILE YOU ARE IN SURGERY.  IF YOU ARE TO BE ADMITTED, ONCE YOU ARE IN YOUR ROOM YOU WILL BE ALLOWED TWO (2) VISITORS. 1 (ONE) VISITOR MAY STAY OVERNIGHT BUT MUST ARRIVE TO THE ROOM BY 8pm.  Minor children may have two parents present. Special consideration for safety and communication needs will be reviewed on a case by case basis.  Special instructions:    Oral Hygiene is also important to reduce your risk of infection.  Remember - BRUSH YOUR TEETH THE MORNING OF SURGERY WITH YOUR REGULAR TOOTHPASTE   - Preparing For Surgery  Before surgery, you can play an important role. Because skin is not sterile, your skin needs to be as free of germs as possible. You can reduce the number of germs on your skin by washing with CHG (chlorahexidine gluconate) Soap before surgery.  CHG is an antiseptic cleaner which kills germs and bonds with the skin to continue killing germs even after washing.     Please do not use if you have an allergy to  CHG or antibacterial soaps. If your skin becomes reddened/irritated stop using the CHG.  Do not shave (including legs and underarms) for at least 48 hours prior to first CHG shower. It is OK to shave your face.  Please follow these instructions carefully.     Shower the NIGHT BEFORE SURGERY and the MORNING OF SURGERY with CHG Soap.   If you chose to wash your hair, wash your hair first as usual with your normal shampoo. After you shampoo, rinse your hair and body thoroughly to remove the shampoo.  Then [employer] and genitals (private parts) with your normal soap and rinse thoroughly to remove soap.  After that Use CHG Soap as you would any other liquid soap.  You can apply CHG directly to the skin and wash gently with a scrungie or a clean washcloth.   Apply the CHG Soap to your body ONLY FROM THE NECK DOWN.  Do not use on open wounds or open sores. Avoid contact with your eyes, ears, mouth and genitals (private parts). Wash Face and genitals (private parts)  with your normal soap.   Wash thoroughly, paying special attention to the area where your surgery will be performed.  Thoroughly rinse your body with warm water from the neck down.  DO NOT shower/wash with your normal soap after using and rinsing off the CHG Soap.  Pat yourself dry with a CLEAN TOWEL.  Wear CLEAN PAJAMAS to bed the night before surgery  Place CLEAN SHEETS on your bed the night before your surgery  DO NOT SLEEP WITH PETS.   Day of Surgery:  Take a shower with CHG soap. Wear Clean/Comfortable clothing the morning of surgery Do not apply any deodorants/lotions.   Remember to brush your teeth WITH YOUR REGULAR TOOTHPASTE.   Please read over the following fact sheets that you were given.

## 2021-11-27 ENCOUNTER — Other Ambulatory Visit: Payer: Self-pay

## 2021-11-27 ENCOUNTER — Encounter (HOSPITAL_COMMUNITY): Payer: Self-pay

## 2021-11-27 ENCOUNTER — Ambulatory Visit (HOSPITAL_COMMUNITY)
Admission: RE | Admit: 2021-11-27 | Discharge: 2021-11-27 | Disposition: A | Discharge status: Home / Self Care | Payer: Medicare Other | Source: Ambulatory Visit | Attending: Cardiovascular Disease | Admitting: Cardiovascular Disease

## 2021-11-27 ENCOUNTER — Encounter (HOSPITAL_COMMUNITY)
Admission: RE | Admit: 2021-11-27 | Discharge: 2021-11-27 | Disposition: A | Discharge status: Home / Self Care | Payer: Medicare Other | Source: Ambulatory Visit | Attending: Cardiovascular Disease | Admitting: Cardiovascular Disease

## 2021-11-27 DIAGNOSIS — Z01818 Encounter for other preprocedural examination: Secondary | ICD-10-CM

## 2021-11-27 DIAGNOSIS — I7121 Aneurysm of the ascending aorta, without rupture: Secondary | ICD-10-CM | POA: Diagnosis not present

## 2021-11-27 DIAGNOSIS — Z952 Presence of prosthetic heart valve: Secondary | ICD-10-CM | POA: Insufficient documentation

## 2021-11-27 DIAGNOSIS — I5032 Chronic diastolic (congestive) heart failure: Secondary | ICD-10-CM | POA: Insufficient documentation

## 2021-11-27 DIAGNOSIS — K449 Diaphragmatic hernia without obstruction or gangrene: Secondary | ICD-10-CM | POA: Diagnosis not present

## 2021-11-27 DIAGNOSIS — I110 Hypertensive heart disease with heart failure: Secondary | ICD-10-CM | POA: Insufficient documentation

## 2021-11-27 DIAGNOSIS — Z20822 Contact with and (suspected) exposure to covid-19: Secondary | ICD-10-CM | POA: Insufficient documentation

## 2021-11-27 DIAGNOSIS — H409 Unspecified glaucoma: Secondary | ICD-10-CM | POA: Insufficient documentation

## 2021-11-27 DIAGNOSIS — I252 Old myocardial infarction: Secondary | ICD-10-CM | POA: Diagnosis not present

## 2021-11-27 DIAGNOSIS — I350 Nonrheumatic aortic (valve) stenosis: Secondary | ICD-10-CM | POA: Diagnosis not present

## 2021-11-27 DIAGNOSIS — E119 Type 2 diabetes mellitus without complications: Secondary | ICD-10-CM | POA: Insufficient documentation

## 2021-11-27 DIAGNOSIS — Z87891 Personal history of nicotine dependence: Secondary | ICD-10-CM | POA: Insufficient documentation

## 2021-11-27 LAB — BLOOD GAS, ARTERIAL
Acid-Base Excess: 0.2 mmol/L (ref 0.0–2.0)
Bicarbonate: 24 mmol/L (ref 20.0–28.0)
FIO2: 21
O2 Saturation: 97.8 %
Patient temperature: 37
pCO2 arterial: 36.5 mmHg (ref 32.0–48.0)
pH, Arterial: 7.433 (ref 7.350–7.450)
pO2, Arterial: 97.4 mmHg (ref 83.0–108.0)

## 2021-11-27 LAB — HEMOGLOBIN A1C
Hgb A1c MFr Bld: 5.8 % — ABNORMAL HIGH (ref 4.8–5.6)
Mean Plasma Glucose: 119.76 mg/dL

## 2021-11-27 LAB — COMPREHENSIVE METABOLIC PANEL
ALT: 25 U/L (ref 0–44)
AST: 26 U/L (ref 15–41)
Albumin: 3.6 g/dL (ref 3.5–5.0)
Alkaline Phosphatase: 67 U/L (ref 38–126)
Anion gap: 9 (ref 5–15)
BUN: 21 mg/dL (ref 8–23)
CO2: 23 mmol/L (ref 22–32)
Calcium: 9.6 mg/dL (ref 8.9–10.3)
Chloride: 104 mmol/L (ref 98–111)
Creatinine, Ser: 0.96 mg/dL (ref 0.44–1.00)
GFR, Estimated: >60 mL/min (ref >60)
Glucose, Bld: 111 mg/dL — ABNORMAL HIGH (ref 70–99)
Potassium: 3.6 mmol/L (ref 3.5–5.1)
Sodium: 136 mmol/L (ref 135–145)
Total Bilirubin: 0.8 mg/dL (ref 0.3–1.2)
Total Protein: 7.4 g/dL (ref 6.5–8.1)

## 2021-11-27 LAB — CBC
HCT: 39.4 % (ref 36.0–46.0)
Hemoglobin: 13.4 g/dL (ref 12.0–15.0)
MCH: 29.7 pg (ref 26.0–34.0)
MCHC: 34 g/dL (ref 30.0–36.0)
MCV: 87.4 fL (ref 80.0–100.0)
Platelets: 186 10*3/uL (ref 150–400)
RBC: 4.51 MIL/uL (ref 3.87–5.11)
RDW: 12.5 % (ref 11.5–15.5)
WBC: 6.8 10*3/uL (ref 4.0–10.5)
nRBC: 0 % (ref 0.0–0.2)

## 2021-11-27 LAB — URINALYSIS, ROUTINE W REFLEX MICROSCOPIC
Bilirubin Urine: NEGATIVE
Glucose, UA: NEGATIVE mg/dL
Hgb urine dipstick: NEGATIVE
Ketones, ur: NEGATIVE mg/dL
Nitrite: NEGATIVE
Protein, ur: NEGATIVE mg/dL
Specific Gravity, Urine: 1.02 (ref 1.005–1.030)
pH: 5 (ref 5.0–8.0)

## 2021-11-27 LAB — PROTIME-INR
INR: 1 (ref 0.8–1.2)
Prothrombin Time: 13.3 seconds (ref 11.4–15.2)

## 2021-11-27 LAB — GLUCOSE, CAPILLARY: Glucose-Capillary: 160 mg/dL — ABNORMAL HIGH (ref 70–99)

## 2021-11-27 LAB — SARS CORONAVIRUS 2 (TAT 6-24 HRS): SARS Coronavirus 2: NEGATIVE

## 2021-11-27 LAB — BRAIN NATRIURETIC PEPTIDE: B Natriuretic Peptide: 45 pg/mL (ref 0.0–100.0)

## 2021-11-27 LAB — SURGICAL PCR SCREEN
MRSA, PCR: NEGATIVE
Staphylococcus aureus: NEGATIVE

## 2021-11-27 LAB — NO BLOOD PRODUCTS

## 2021-11-27 NOTE — Progress Notes (Signed)
PCP - Dr. Catalina Antigua Cardiologist - Dr. Rozann Lesches  PPM/ICD - denies   Chest x-ray - 11/27/21 at PAT EKG - 11/27/21 at PAT Stress Test - 09/23/21 ECHO - 09/11/21 Cardiac Cath - 10/02/21  Sleep Study - denies  DM- Type 2 Pt does not check CBG at home  Blood Thinner Instructions: n/a Aspirin Instructions: pt has held ASA since 1/25  ERAS Protcol - no, NPO   COVID TEST- 11/27/21 at PAT   Anesthesia review: yes, cardiac hx  Patient denies shortness of breath, fever, cough and chest pain at PAT appointment   All instructions explained to the patient, with a verbal understanding of the material. Patient agrees to go over the instructions while at home for a better understanding. Patient also instructed to wear a mask in public after being tested for COVID-19. The opportunity to ask questions was provided.

## 2021-11-30 MED ORDER — HEPARIN 30,000 UNITS/1000 ML (OHS) CELLSAVER SOLUTION
Status: DC
  Filled 2021-11-30 (×2): qty 1000

## 2021-11-30 MED ORDER — POTASSIUM CHLORIDE 2 MEQ/ML IV SOLN
80.0000 meq | INTRAVENOUS | Status: DC
  Filled 2021-11-30 (×2): qty 40

## 2021-11-30 MED ORDER — MAGNESIUM SULFATE 50 % IJ SOLN
40.0000 meq | INTRAMUSCULAR | Status: DC
  Filled 2021-11-30 (×2): qty 9.85

## 2021-11-30 MED ORDER — DEXMEDETOMIDINE HCL IN NACL 400 MCG/100ML IV SOLN
0.1000 ug/kg/h | INTRAVENOUS | Status: AC
  Administered 2021-12-01: 1 ug/kg/h via INTRAVENOUS
  Administered 2021-12-01: 78.8 ug via INTRAVENOUS
  Filled 2021-11-30 (×2): qty 100

## 2021-11-30 MED ORDER — NOREPINEPHRINE 4 MG/250ML-% IV SOLN
0.0000 ug/min | INTRAVENOUS | Status: AC
  Filled 2021-11-30: qty 250

## 2021-11-30 MED ORDER — CEFAZOLIN SODIUM-DEXTROSE 2-4 GM/100ML-% IV SOLN
2.0000 g | INTRAVENOUS | Status: AC
  Administered 2021-12-01: 2 g via INTRAVENOUS
  Filled 2021-11-30 (×2): qty 100

## 2021-11-30 NOTE — H&P (Signed)
[address]       [place] [address]             [phone]      Cardiothoracic Surgery Admission History and Physical  PCP is Leeanne Rio, MD  Referring Provider is Sherren Mocha, MD  Primary Cardiologist is Rozann Lesches, MD   Reason for admission: Severe prosthetic aortic valve stenosis  HPI:  The patient is a 66 year old Courtney Harvey with a history of type 2 diabetes, hypertension, and bicuspid aortic valve disease who underwent aortic valve replacement using a 21 mm Edwards magna-ease pericardial valve and closure of a PFO by Dr. Roxy Manns in 2013. She now presents with increasing shortness of breath with exertion and chest discomfort as well as exertional fatigue and tiredness. She does report some episodes of dizziness when getting up from a lying position and sometimes with exertion. She denies orthopnea. She has had no peripheral edema. A 2D echocardiogram on 09/11/2021 showed a mean gradient across aortic valve of 61 mmHg with a peak gradient of 104.2 mmHg and a valve area by VTI of 0.83 cm. Left ventricular ejection fraction was 70 to 75%. A nuclear stress test on 09/23/2021 showed findings consistent with prior inferior MI with moderate peri-infarct ischemia. Ejection fraction was 69%. This is felt to be a low to intermediate risk study. A TEE was done on 09/30/2021 which showed severe prosthetic aortic valve stenosis. Valve area by planimetry was 0.63 cm with a mean gradient of 55 mmHg. Peak gradient was 95 mmHg. Dimensionless index of 0.2. Aortic regurgitation was trivial. There was severe thickening and calcification of the leaflets along the commissures with severely restricted leaflet motion. Ejection fraction was 60 to 65%. Cardiac catheterization on 10/02/2021 showed patent coronary arteries with minimal luminal irregularities. Right heart pressures were normal.   She is widowed. She is a remote smoker.      Past Medical History:  Diagnosis  Date   Chronic diastolic heart failure ([place])    Essential hypertension    Glaucoma    H/O hiatal hernia    History of kidney stones    Recurrent upper respiratory infection (URI)    S/P aortic valve replacement 01/26/2012   56mm [facility] Ease pericardial tissue valve   S/P patent foramen ovale closure 01/26/2012   PFO found on TEE prior to AVR. Performed at the time of aortic valve replacement   Type 2 diabetes mellitus [facility])         Past Surgical History:  Procedure Laterality Date   AORTIC VALVE REPLACEMENT  01/26/2012   Procedure: AORTIC VALVE REPLACEMENT (AVR); Surgeon: Rexene Alberts, MD; Location: [facility]; Service: Open Heart Surgery; Laterality: N/A;   CARDIAC CATHETERIZATION  2005 and 2013   Friday March 8th pcp Dr tapper in [place] D & C N/A 09/06/2018   Procedure: DILATATION AND CURETTAGE /HYSTEROSCOPY ENDOMETRIAL POLYPECTOMY; Surgeon: Florian Buff, MD; Location: [facility]; Service: Gynecology; Laterality: N/A;   LEFT AND RIGHT HEART CATHETERIZATION WITH CORONARY ANGIOGRAM N/A 01/07/2012   Procedure: LEFT AND RIGHT HEART CATHETERIZATION WITH CORONARY ANGIOGRAM; Surgeon: Josue Hector, MD; Location: [facility] CATH LAB; Service: Cardiovascular; Laterality: N/A;   RIGHT HEART CATH AND CORONARY ANGIOGRAPHY N/A 10/02/2021   Procedure: RIGHT HEART CATH AND CORONARY ANGIOGRAPHY; Surgeon: Sherren Mocha, MD; Location: [facility] CV LAB; Service: Cardiovascular; Laterality: N/A;   TEE WITHOUT CARDIOVERSION  01/10/2012   Procedure: TRANSESOPHAGEAL ECHOCARDIOGRAM (TEE); Surgeon: Carmin Muskrat  Harrington Challenger, MD; Location: [facility] ENDOSCOPY; Service: Cardiovascular; Laterality: N/A; TEE call Trish in am for time   TEE WITHOUT CARDIOVERSION N/A 09/30/2021   Procedure: TRANSESOPHAGEAL ECHOCARDIOGRAM (TEE) WITH 3D IMAGING; Surgeon: Freada Bergeron, MD; Location: [facility] ENDOSCOPY; Service: Cardiovascular; Laterality: N/A;        Family History  Problem Relation Age of Onset   Lung cancer Father     Uterine cancer Mother    Hypertension Brother    Cancer Sister    Hypertension Son    Social History        Socioeconomic History   Marital status: Widowed    Spouse name: Not on file   Number of children: 2   Years of education: Not on file   Highest education level: Not on file  Occupational History    Employer: [employer]  Tobacco Use   Smoking status: Former    Packs/day: 1.00    Years: 40.00    Pack years: 40.00    Types: Cigarettes    Quit date: 01/06/2012    Years since quitting: 9.8   Smokeless tobacco: Never  Vaping Use   Vaping Use: Never used  Substance and Sexual Activity   Alcohol use: No    Alcohol/week: 0.0 standard drinks   Drug use: No   Sexual activity: Not Currently    Birth control/protection: Post-menopausal  Other Topics Concern   Not on file  Social History Narrative   She is from [place], [place]. She is widowed. Her husband committed suicide sometime last year, but she feels that she has dealt with it. She currently lives alone and works at [employer] Strain: Not on file  Food Insecurity: Not on file  Transportation Needs: Not on file  Physical Activity: Not on file  Stress: Not on file  Social Connections: Not on file  Intimate Partner Violence: Not on file          Prior to Admission medications   Medication Sig Start Date End Date Taking? Authorizing Provider  acetaminophen (TYLENOL) 500 MG tablet Take 1,000 mg by mouth every 6 (six) hours as needed for moderate pain.   Yes [provider]  cetirizine (ZYRTEC) 10 MG tablet Take 10 mg by mouth daily.   Yes [provider]  fluticasone (FLONASE) 50 MCG/ACT nasal spray Place 1 spray into both nostrils daily as needed for allergies or rhinitis.   Yes [provider]  metFORMIN (GLUCOPHAGE-XR) 500 MG 24 hr tablet Take 500 mg by mouth daily before breakfast. 03/09/21  Yes [provider]   nicotine polacrilex (NICORETTE) 2 MG gum Take 2 mg by mouth as needed for smoking cessation.   Yes [provider]  triamterene-hydrochlorothiazide (MAXZIDE-25) 37.5-25 MG tablet Take 1 tablet by mouth daily. 08/09/18  Yes [provider]         Current Outpatient Medications  Medication Sig Dispense Refill   acetaminophen (TYLENOL) 500 MG tablet Take 1,000 mg by mouth every 6 (six) hours as needed for moderate pain.     cetirizine (ZYRTEC) 10 MG tablet Take 10 mg by mouth daily.     fluticasone (FLONASE) 50 MCG/ACT nasal spray Place 1 spray into both nostrils daily as needed for allergies or rhinitis.     metFORMIN (GLUCOPHAGE-XR) 500 MG 24 hr tablet Take 500 mg by mouth daily before breakfast.     nicotine polacrilex (NICORETTE) 2 MG gum Take 2 mg by mouth  as needed for smoking cessation.     triamterene-hydrochlorothiazide (MAXZIDE-25) 37.5-25 MG tablet Take 1 tablet by mouth daily.  1            Current Facility-Administered Medications  Medication Dose Route Frequency Provider Last Rate Last Admin   sodium chloride flush (NS) 0.9 % injection 3 mL 3 mL Intravenous Q12H Satira Sark, MD          Allergies  Allergen Reactions   Peanut-Containing Drug Products Other (See Comments)    headache   Wellbutrin [Bupropion Hcl] Itching   Review of Systems:  General: normal appetite, + decreased energy, no weight gain, no weight loss, no fever  Cardiac: + chest pain with exertion, no chest pain at rest, +SOB with mild exertion, + resting SOB, no PND, no orthopnea, no palpitations, no arrhythmia, no atrial fibrillation, no LE edema, + dizzy spells, no syncope  Respiratory: + shortness of breath, no home oxygen, no productive cough, no dry cough, no bronchitis, no wheezing, no hemoptysis, no asthma, no pain with inspiration or cough, no sleep apnea, no CPAP at night  GI: no difficulty swallowing, no reflux, no frequent heartburn, no hiatal hernia, no abdominal pain, no  constipation, no diarrhea, no hematochezia, no hematemesis, no melena  GU: no dysuria, no frequency, no urinary tract infection, no hematuria, no kidney stones, no kidney disease  Vascular: no pain suggestive of claudication, no pain in feet, no leg cramps, no varicose veins, no DVT, no non-healing foot ulcer  Neuro: no stroke, no TIA's, no seizures, + headaches, no temporary blindness one eye, no slurred speech, no peripheral neuropathy, no chronic pain, no instability of gait, no memory/cognitive dysfunction  Musculoskeletal: no arthritis, no joint swelling, no myalgias, no difficulty walking, no mobility  Skin: no rash, no itching, no skin infections, no pressure sores or ulcerations  Psych: no anxiety, no depression, no nervousness, no unusual recent stress  Eyes: + blurry vision, no floaters, no recent vision changes, + wears glasses  ENT: no hearing loss, no loose or painful teeth, no dentures, last saw dentist 11/04/21  Hematologic: no easy bruising, no abnormal bleeding, no clotting disorder, no frequent epistaxis  Endocrine: + diabetes, does not check CBG's at home  Physical Exam:  BP 123/80 (BP Location: Left Arm, Patient Position: Sitting)   Pulse (!) 106   Resp 20   Ht 5\' 6"  (1.676 m)   Wt 177 lb (80.3 kg)   LMP (LMP Unknown)   SpO2 96% Comment: RA   BMI 28.57 kg/m  General: well-appearing  HEENT: Unremarkable, NCAT, PERLA, EOMI  Neck: no JVD, no bruits, no adenopathy  Chest: clear to auscultation, symmetrical breath sounds, no wheezes, no rhonchi  CV: RRR, 3/6 systolic murmur RSB, no diastolic murmur  Abdomen: soft, non-tender, no masses  Extremities: warm, well-perfused, pulses palpable at ankle, lower extremity edema  Rectal/GU Deferred  Neuro: Grossly non-focal and symmetrical throughout  Skin: Clean and dry, no rashes, no breakdown  Diagnostic Tests:  TRANSESOPHOGEAL ECHO REPORT     Patient Name: Courtney Harvey Date of Exam: 09/30/2021  Medical Rec #: [id number] Height:  66.0 in  Accession #: [id number] Weight: 170.0 lb  Date of Birth: 04/10/1956 BSA: 1.866 m  Patient Age: 31 years BP: 93/69 mmHg  Patient Gender: F HR: 70 bpm.  Exam Location: Inpatient   Procedure: Transesophageal Echo, 3D Echo, Cardiac Doppler and Color  Doppler   Indications: Increased Gradient of Prosthetic Aortic Valve   History: Patient has  prior history of Echocardiogram examinations,  most  recent 09/11/2021. Aortic Valve Disease; Risk  Factors:Diabetes  and Hypertension.  Aortic Valve: 21 mm Edwards valve is present in the  aortic  position. Procedure Date: 01/26/2012.   Sonographer: Bernadene Person RDCS  Referring Phys: [id number] Verta Ellen.  Diagnosing Phys: Gwyndolyn Kaufman MD   PROCEDURE: After discussion of the risks and benefits of a TEE, an  informed consent was obtained from the patient. The transesophogeal probe  was passed without difficulty through the esophogus of the patient. Local  oropharyngeal anesthetic was provided  with Cetacaine. Sedation performed by different physician. The patient was  monitored while under deep sedation. Anesthestetic sedation was provided  intravenously by Anesthesiology: 291.36mg  of Propofol, 80mg  of Lidocaine.  The patient developed no  complications during the procedure.   IMPRESSIONS    1. A 7mm Edwards Magna Ease pericardial tissue valve is present in the  aortic position. Procedure date 01/26/2012. There is severe thickening and  calcification of the bioprosthetic leaflet tips along the commissures with  severely restricted leaflet  motion. There is severe bioprosthetic aortic valve stenosis. AVA by 2D  planimetry is 0.63cm2 and 0.71cm2 by continuity, mean gradient 30mmHg,  peak gradient 60mmHg, Vmax 4.71m/s, DI 0.2. Aortic valve regurgitation is  trivial.  2. Left ventricular ejection fraction, by estimation, is 60 to 65%. The  left ventricle has normal function. The left ventricle has no regional  wall motion  abnormalities.  3. Right ventricular systolic function is low normal. The right  ventricular size is normal.  4. No left atrial/left atrial appendage thrombus was detected.  5. The mitral valve is normal in structure. Mild mitral valve  regurgitation. No evidence of mitral stenosis.  6. There is a 21 mm Edwards valve present in the aortic position.  Procedure Date: 01/26/2012. There is severe bioprosthetic aortic valve  stenosis as detailed above. Trivial aortic regurgitation.  7. Aortic dilatation noted. There is mild dilatation of the ascending  aorta, measuring 38 mm.  8. The patient is status-post PFO closure with no residual shunting noted  on color doppler.   FINDINGS  Left Ventricle: Left ventricular ejection fraction, by estimation, is 60  to 65%. The left ventricle has normal function. The left ventricle has no  regional wall motion abnormalities. The left ventricular internal cavity  size was normal in size.   Right Ventricle: The right ventricular size is normal. No increase in  right ventricular wall thickness. Right ventricular systolic function is  low normal.   Left Atrium: Left atrial size was normal in size. No left atrial/left  atrial appendage thrombus was detected.   Right Atrium: Right atrial size was normal in size.   Pericardium: There is no evidence of pericardial effusion.   Mitral Valve: The mitral valve is normal in structure. There is mild  thickening of the mitral valve leaflet(s). Mild mitral valve  regurgitation. No evidence of mitral valve stenosis.   Tricuspid Valve: The tricuspid valve is normal in structure. Tricuspid  valve regurgitation is trivial.   Aortic Valve: A 101mm Edwards Magna Ease pericardial tissue valve is  present in the aortic position. There is severe thickening and  calcification of the bioprosthetic leaflet tips along the commisural lines  with severely restricted leaflet motion. There  is severe bioprosthetic aortic valve  stenosis. AVA by 2D planimetry is  0.63cm2 and 0.71cm2 by continuity, mean gradient 69mmHg, peak gradient  15mmHg, Vmax 4.4m/s, DI 0.2. The aortic valve has been  repaired/replaced.  Aortic valve regurgitation is trivial.  Aortic valve mean gradient measures 55.0 mmHg. Aortic valve peak gradient  measures 96.0 mmHg. Aortic valve area, by VTI measures 0.67 cm. There is  a 21 mm Edwards valve present in the aortic position. Procedure Date:  01/26/2012.   Pulmonic Valve: The pulmonic valve was normal in structure. Pulmonic valve  regurgitation is trivial.   Aorta: The aortic root is normal in size and structure and aortic  dilatation noted. There is mild dilatation of the ascending aorta,  measuring 38 mm.   IAS/Shunts: No atrial level shunt detected by color flow Doppler.    LEFT VENTRICLE  PLAX 2D  LVOT diam: 2.16 cm  LV SV: 79  LV SV Index: 43  LVOT Area: 3.67 cm    AORTIC VALVE  AV Area (Vmax): 0.66 cm  AV Area (Vmean): 0.67 cm  AV Area (VTI): 0.67 cm  AV Vmax: 490.00 cm/s  AV Vmean: 341.000 cm/s  AV VTI: 1.180 m  AV Peak Grad: 96.0 mmHg  AV Mean Grad: 55.0 mmHg  LVOT Vmax: 88.00 cm/s  LVOT Vmean: 62.200 cm/s  LVOT VTI: 0.216 m  LVOT/AV VTI ratio: 0.18   AORTA  Ao Root diam: 3.54 cm  Ao Asc diam: 3.79 cm    SHUNTS  Systemic VTI: 0.22 m  Systemic Diam: 2.16 cm   Laurance Flatten MD  Electronically signed by Laurance Flatten MD  Signature Date/Time: 09/30/2021/10:35:32 AM     Final (Updated)  Physicians  Panel Physicians Referring Physician Case Authorizing Physician  [name], MD (Primary)    Procedures  RIGHT HEART CATH AND CORONARY ANGIOGRAPHY  Conclusion   There is severe aortic valve stenosis.  Patent coronary arteries with mild luminal irregularities, no significant stenoses Known severe bioprosthetic aortic stenosis Normal right heart pressures Recommend: Cardiac and peripheral CTA studies, cardiac surgical consultations to review  treatment options (redo surgery versus valve-in-valve TAVR)  Surgeon Notes    09/30/2021 8:21 AM CV Procedure signed by [name], MD  Procedural Details  Technical Details INDICATION: Severe bioprosthetic aortic stenosis. 66 yo woman with hx of bioprosthetic AVR with a 21 mm Edwards Magna Ease valve in 2013 by Dr Cornelius Moras. She has developed severe, symptomatic, bioprosthetic aortic stenosis and is referred for preop R/L heart cath.   PROCEDURAL DETAILS: There was an indwelling IV in a right antecubital vein. Using normal sterile technique, the IV was changed out for a 5 Fr brachial sheath over a 0.018 inch wire. The right wrist was then prepped, draped, and anesthetized with 1% lidocaine. Using the modified Seldinger technique a 5/6 French Slender sheath was placed in the right radial artery. Intra-arterial verapamil was administered through the radial artery sheath. IV heparin was administered after a JR4 catheter was advanced into the central aorta. A Swan-Ganz catheter was used for the right heart catheterization. Standard protocol was followed for recording of right heart pressures and sampling of oxygen saturations. Fick cardiac output was calculated. Standard Judkins catheters were used for selective coronary angiography. No attempt is made to cross the aortic valve. There were no immediate procedural complications. The patient was transferred to the post catheterization recovery area for further monitoring.     Estimated blood loss <50 mL.   During this procedure medications were administered to achieve and maintain moderate conscious sedation while the patient's heart rate, blood pressure, and oxygen saturation were continuously monitored and I was present face-to-face 100% of this time.  Medications  (Filter: Administrations occurring  from 1405 to 1439 on 10/02/21)  important Continuous medications are totaled by the amount administered until 10/02/21 1439.  midazolam (VERSED)  injection (mg)  Total dose: 2 mg  Date/Time Rate/Dose/Volume Action   10/02/21 1409 2 mg Given   fentaNYL (SUBLIMAZE) injection (mcg)  Total dose: 25 mcg  Date/Time Rate/Dose/Volume Action   10/02/21 1409 25 mcg Given   lidocaine (PF) (XYLOCAINE) 1 % injection (mL)  Total volume: 4 mL  Date/Time Rate/Dose/Volume Action   10/02/21 1414 2 mL Given   1416 2 mL Given   Radial Cocktail/Verapamil only (mL)  Total volume: 10 mL  Date/Time Rate/Dose/Volume Action   10/02/21 1418 10 mL Given   heparin sodium (porcine) injection (Units)  Total dose: 4,000 Units  Date/Time Rate/Dose/Volume Action   10/02/21 1423 4,000 Units Given   iohexol (OMNIPAQUE) 350 MG/ML injection (mL)  Total volume: 50 mL  Date/Time Rate/Dose/Volume Action   10/02/21 1432 50 mL Given   Heparin (Porcine) in NaCl 1000-0.9 UT/500ML-% SOLN (mL)  Total volume: 1,000 mL  Date/Time Rate/Dose/Volume Action   10/02/21 1432 500 mL Given   1432 500 mL Given   Sedation Time  Sedation Time Physician-1: 20 minutes 58 seconds  Contrast  Medication Name Total Dose  iohexol (OMNIPAQUE) 350 MG/ML injection 50 mL  Radiation/Fluoro  Fluoro time: 3.7 (min)  DAP: 17820 (mGycm2)  Cumulative Air Kerma: 0000000 (mGy)  Complications     Complications documented before study signed (10/02/2021 [id number] PM)    No complications were associated with this study.   Documented by Chana Bode, RN - 10/02/2021 2:38 PM   Coronary Findings  Diagnostic  Dominance: Left  Left Anterior Descending  The vessel exhibits minimal luminal irregularities. Large vessel that reaches the LV apex, mild irregularities noted. No significant stenosis throughout the LAD territory.  Left Circumflex  The vessel exhibits minimal luminal irregularities. Large, dominant circumflex, supplies OM and left PDA branches without any significant stenoses.  Right Coronary Artery  Vessel is angiographically normal.  Intervention   No interventions have been documented.   Left Heart  Aortic Valve There is severe aortic valve stenosis. The patient has a bioprosthetic aortic valve that functions abnormally.  Coronary Diagrams  Diagnostic  Dominance: Left  Intervention  Implants  No implant documentation for this case.   Syngo Images  Show images for CARDIAC CATHETERIZATION  Images on Long Term Storage  Show images for Rahilly, Blonnie Constantinides Link to Procedure Log    Procedure Log  Hemo Data  Flowsheet Row Most Recent Value  Fick Cardiac Output 4.83 L/min  Fick Cardiac Output Index 2.59 (L/min)/BSA  RA A Wave 5 mmHg  RA V Wave 7 mmHg  RA Mean 3 mmHg  RV Systolic Pressure 23 mmHg  RV Diastolic Pressure -1 mmHg  RV EDP 4 mmHg  PA Systolic Pressure 22 mmHg  PA Diastolic Pressure 1 mmHg  PA Mean 12 mmHg  PW A Wave 11 mmHg  PW V Wave 7 mmHg  PW Mean 4 mmHg  AO Systolic Pressure 92 mmHg  AO Diastolic Pressure 57 mmHg  AO Mean 71 mmHg  QP/QS 1  TPVR Index 4.63 HRUI  TSVR Index 27.42 HRUI  PVR SVR Ratio 0.12  TPVR/TSVR Ratio 0.17   ADDENDUM REPORT: 10/16/2021 13:52  CLINICAL DATA: Aortic Valve pathology with assessment for TAVR  EXAM:  Cardiac TAVR CT  TECHNIQUE:  The patient was scanned on a Siemens Force [id number] slice scanner. A 120  kV retrospective scan was triggered  in the descending thoracic aorta  at 111 HU's. Gantry rotation speed was 270 msecs and collimation was  .9 mm. No beta blockade or nitro were given. The 3D data set was  reconstructed in 5% intervals of the R-R cycle. Systolic and  diastolic phases were analyzed on a dedicated work station using  MPR, MIP and VRT modes. The patient received 95 cc of contrast.  FINDINGS:  Prosthetic Valve: There is a 21 mm [employer] valve with  hypo-attenuated leaflet thickening of all three cusps, starting at  the base and up to 50% of leaflet involvement  Level of coronary ostia above stent posts: Yes  LVOT calcification: None  Annular calcification: None  Perimembranous septal  diameter: 9 mm  Mitral Valve: No appreciable calcium  Aortic Annulus Measurements  Major annulus diameter: 22 mm  Minor annulus diameter:21 mm  Annular perimeter: 65 mm  Annular area: 3.3 cm2  Aortic Root Measurements  Sinotubular Junction: 34 mm  Ascending Thoracic Aorta: 46.5 mm  Aortic Arch: 31 mm  Descending Thoracic Aorta: 30 mm  Sinus of Valsalva Measurements:  Right coronary cusp width: 34 mm  Left coronary cusp width: 33 mm  Non coronary cusp width: 34 mm  Mean diameter: 34 mm  Coronary Artery Height above prosthetic valve base:  Left Main: 10 mm  Left SoV height: 20 mm  Right Coronary: 14 mm  Right SoV height: 20 mm  Optimum Fluoroscopic Angle for Delivery: LAO 0, CRA 14  Valves for structural team consideration: Sapien Valve 20 mm, Sapien  Valve 23 mm (True Balloon size 22 mm, this is a fracturable valve).  Non TAVR Valve Findings:  Coronary Arteries: Small RCA, normal coronary origins  Coronary Calcium Score:  Left main: 5  Left anterior descending artery: 175  Left circumflex artery: 33  Right coronary artery: 2  Total: 215  Percentile: 89th for age, sex, and race matched control.  Status post PFO closure without contrast extravasation across  interatrial septum.  Normal Pulmonary vein anatomy.  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  Normal main pulmonary artery diameter.  Aortic atherosclerosis noted.  Prior median sternotomy noted. At the level of the aortic valve the  RV pericardium is adjacent to the sternum.  IMPRESSION:  1. Prosthetic Aortic stenosis. Findings pertinent to TAVR procedure  are detailed above.  2. Patient's total coronary artery calcium score is 215, which is  89th percentile for subjects of the same age, gender, and race based  populations.  3. Moderate ascending thoracic aortic aneurysm, 46.5 mm maximal  diameter. Consider serial follow up to evaluate expansion if  clinically indicated.  4.  Aortic atherosclerosis.  RECOMMENDATIONS:  Coronary artery calcium (CAC) score is a strong predictor of  incident coronary heart disease (CHD) and provides predictive  information beyond traditional risk factors. CAC scoring is  reasonable to use in the decision to withhold, postpone, or initiate  statin therapy in intermediate-risk or selected borderline-risk  asymptomatic adults (age 83-75 years and LDL-C >=70 to <190 mg/dL)  who do not have diabetes or established atherosclerotic  cardiovascular disease (ASCVD).* In intermediate-risk (10-year ASCVD  risk >=7.5% to <20%) adults or selected borderline-risk (10-year  ASCVD risk >=5% to <7.5%) adults in whom a CAC score is measured for  the purpose of making a treatment decision the following  recommendations have been made:  If CAC = 0, it is reasonable to withhold statin therapy and reassess  in 5 to 10 years,  as long as higher risk conditions are absent  (diabetes mellitus, family history of premature CHD in first degree  relatives (males <55 years; females <65 years), cigarette smoking,  LDL >=190 mg/dL or other independent risk factors).  If CAC is 1 to 99, it is reasonable to initiate statin therapy for  patients >=60 years of age.  If CAC is >=100 or >=75th percentile, it is reasonable to initiate  statin therapy at any age.  Cardiology referral should be considered for patients with CAC  scores >=400 or >=75th percentile.  *2018 AHA/ACC/AACVPR/AAPA/ABC/ACPM/ADA/AGS/APhA/ASPC/NLA/PCNA  Guideline on the Management of Blood Cholesterol: A Report of the  American College of Cardiology/American Heart Association Task Force  on Clinical Practice Guidelines. J Am Coll Cardiol.  2019;73(24):3168-3209.  Mahesh Chandrasekhar  Electronically Signed  By: Rudean Haskell M.D.  On: 10/16/2021 13:52   Addended by Werner Lean, MD on 10/16/2021 1:54 PM  Study Result  Narrative & Impression  EXAM:  OVER-READ INTERPRETATION  CT CHEST  The following report is an over-read performed by radiologist Dr.  Vinnie Langton of [facility], [place] on 10/15/2021. This  over-read does not include interpretation of cardiac or coronary  anatomy or pathology. The coronary calcium score/coronary CTA  interpretation by the cardiologist is attached.  COMPARISON: Cardiac CT 01/11/2012.  FINDINGS:  Extracardiac findings will be described separately under dictation  for contemporaneously obtained CTA chest, abdomen and pelvis.  IMPRESSION:  Please see separate dictation for contemporaneously obtained CTA  chest, abdomen and pelvis dated 10/15/2021 for full description of  relevant extracardiac findings.  Electronically Signed:  By: Vinnie Langton M.D.  On: 10/15/2021 11:35    Narrative & Impression  CLINICAL DATA: 66 year old female with history of severe aortic  stenosis. Preprocedural study prior to potential transcatheter  aortic valve replacement (TAVR) procedure.  EXAM:  CT ANGIOGRAPHY CHEST, ABDOMEN AND PELVIS  TECHNIQUE:  Multidetector CT imaging through the chest, abdomen and pelvis was  performed using the standard protocol during bolus administration of  intravenous contrast. Multiplanar reconstructed images and MIPs were  obtained and reviewed to evaluate the vascular anatomy.  CONTRAST: 10mL OMNIPAQUE IOHEXOL 350 MG/ML SOLN  COMPARISON: Cardiac CT 01/11/2012. CT of the abdomen and pelvis  01/11/2012.  FINDINGS:  CTA CHEST FINDINGS  Cardiovascular: Heart size is normal. There is no significant  pericardial fluid, thickening or pericardial calcification. There is  aortic atherosclerosis, as well as atherosclerosis of the great  vessels of the mediastinum and the coronary arteries, including  calcified atherosclerotic plaque in the left anterior descending and  left circumflex coronary arteries. Aneurysmal dilatation of the  ascending thoracic aorta which measures up to 4.6 cm in diameter.  Status  post median sternotomy for aortic valve replacement with what  appears to be a stented bioprosthesis. There is some calcification  of the bioprosthetic valve cusps.  Mediastinum/Lymph Nodes: No pathologically enlarged mediastinal or  hilar lymph nodes. Hilar esophagus is unremarkable in appearance. No  axillary lymphadenopathy.  Lungs/Pleura: Mild paraseptal emphysema. No acute consolidative  airspace disease. No pleural effusions. No suspicious appearing  pulmonary nodules or masses are noted.  Musculoskeletal/Soft Tissues: Median sternotomy wires. There are no  aggressive appearing lytic or blastic lesions noted in the  visualized portions of the skeleton.  CTA ABDOMEN AND PELVIS FINDINGS  Hepatobiliary: Diffuse low attenuation throughout the hepatic  parenchyma, indicative of a background of hepatic steatosis. No  suspicious cystic or solid hepatic lesions. No intra or extrahepatic  biliary ductal dilatation. Gallbladder is normal  in appearance.  Pancreas: No pancreatic mass. No pancreatic ductal dilatation. No  pancreatic or peripancreatic fluid collections or inflammatory  changes.  Spleen: Unremarkable.  Adrenals/Urinary Tract: Bilateral kidneys and bilateral adrenal  glands are normal in appearance. No hydroureteronephrosis. Urinary  bladder is normal in appearance.  Stomach/Bowel: The appearance of the stomach is normal. There is no  pathologic dilatation of small bowel or colon. Normal appendix.  Vascular/Lymphatic: Aortic atherosclerosis, without evidence of  aneurysm or dissection in the abdominal or pelvic vasculature.  Vascular findings and measurements pertinent to potential TAVR  procedure, as detailed below. No lymphadenopathy noted in the  abdomen or pelvis.  Reproductive: Status post hysterectomy. Ovaries are not confidently  identified may be surgically absent or atrophic.  Other: No significant volume of ascites. No pneumoperitoneum.  Musculoskeletal: There are  no aggressive appearing lytic or blastic  lesions noted in the visualized portions of the skeleton.  VASCULAR MEASUREMENTS PERTINENT TO TAVR:  AORTA:  Minimal Aortic Diameter-17 x 15 mm  Severity of Aortic Calcification-mild  RIGHT PELVIS:  Right Common Iliac Artery -  Minimal Diameter-7.1 x 8.6 mm  Tortuosity-mild  Calcification-moderate  Right External Iliac Artery -  Minimal Diameter-6.8 x 6.3 mm  Tortuosity-mild  Calcification-mild  Right Common Femoral Artery -  Minimal Diameter-6.6 x 6.6 mm  Tortuosity-mild  Calcification-none  LEFT PELVIS:  Left Common Iliac Artery -  Minimal Diameter-9.3 x 9.0 mm  Tortuosity-moderate  Calcification-moderate  Left External Iliac Artery -  Minimal Diameter-6.9 x 6.1 mm  Tortuosity-moderate  Calcification-none  Left Common Femoral Artery -  Minimal Diameter-6.7 x 7.1 mm  Tortuosity-mild  Calcification-mild  Review of the MIP images confirms the above findings.  IMPRESSION:  1. Vascular findings and measurements pertinent to potential TAVR  procedure, as detailed above.  2. Status post aortic valve replacement with stented bioprosthesis  which demonstrates calcifications of the valve cusps, compatible  with reported clinical history of severe aortic stenosis.  3. Aortic atherosclerosis, in addition to 2 vessel coronary artery  disease. There is also aneurysmal dilatation of the ascending  thoracic aorta which measures up to 4.6 cm in diameter. Ascending  thoracic aortic aneurysm. Recommend semi-annual imaging followup by  CTA or MRA and referral to cardiothoracic surgery if not already  obtained. This recommendation follows 2010  ACCF/AHA/AATS/ACR/ASA/SCA/SCAI/SIR/STS/SVM Guidelines for the  Diagnosis and Management of Patients With Thoracic Aortic Disease.  Circulation. 2010; 121ML:[id number]. Aortic aneurysm NOS  (ICD10-I71.9).  4. Hepatic steatosis.  5. Additional incidental findings, as above.  Electronically Signed  By:  Vinnie Langton M.D.  On: 10/15/2021 12:09     Impression:  This 66 year old woman has stage D, severe, symptomatic bioprosthetic aortic valve stenosis with New [place] Heart Association class II symptoms of exertional fatigue and shortness of breath consistent with chronic diastolic congestive heart failure. I have personally reviewed her TEE, cardiac catheterization, and CTA studies. TEE from 09/30/2021 shows a 21 mm pericardial valve with severe thickening and calcification of the leaflets and severely restricted motion. The aortic valve area by 2D planimetry is 0.63 cm and 0.71 cm by continuity. The mean gradient is 55 mmHg with a peak gradient of 95 mmHg. Dimensionless index is 0.2. There is mild mitral valve regurgitation. Left ventricular ejection fraction is 60 to 65%. Cardiac catheterization shows no coronary disease. CTA of her chest shows a 4.6 cm fusiform ascending aortic aneurysm. This was noted to be 3.7 cm on gated cardiac CT done in March 2013 prior to her initial aortic valve  replacement. She has not had any studies since then. She did have a bicuspid aortic valve. I agree that redo aortic valve replacement is indicated in this patient for relief of her symptoms and to prevent progressive left ventricular deterioration. Given her relatively young age I think the best long-term option for her would be open surgical redo aortic valve replacement and replacement of her ascending aortic aneurysm. This may improve her options long-term if she developed recurrent prosthetic aortic valve deterioration. It would also decrease the risk of aortic dissection and progressive enlargement of the ascending aortic aneurysm. 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 Therefore I would not consider her a candidate for redo aortic valve replacement and especially replacement of her aortic valve and ascending aneurysm due to the risk of requiring blood products for  that type of surgery. I think transcatheter valve in valve aortic valve replacement would be a reasonable option under the circumstances although it may limit her options long-term if that valve deteriorates and she may still have progressive enlargement of her ascending aortic aneurysm. I discussed all this with her and answered her questions.  The patient was counseled at length regarding treatment alternatives for management of severe symptomatic bioprosthetic aortic stenosis. The risks and benefits of surgical intervention has been discussed in detail. Long-term prognosis with medical therapy was discussed. Alternative approaches such as conventional surgical aortic valve replacement, transcatheter aortic valve replacement, and palliative medical therapy were compared and contrasted at length. This discussion was placed in the context of the patient's own specific clinical presentation and past medical history. All of her questions have been addressed.  Following the decision to proceed with transcatheter aortic valve replacement, a discussion was held regarding what types of management strategies would be attempted intraoperatively in the event of life-threatening complications, including whether or not the patient would be considered a candidate for the use of cardiopulmonary bypass and/or conversion to open sternotomy for attempted surgical intervention. I would not consider her a candidate for emergent sternotomy to manage any intraoperative complications due to her previous sternotomy for aortic valve replacement and refusal of any blood product transfusion. The patient is aware of the fact that transient use of cardiopulmonary bypass may be necessary. The patient has been advised of a variety of complications that might develop including but not limited to risks of death, stroke, paravalvular leak, aortic dissection or other major vascular complications, aortic annulus rupture, device embolization,  cardiac rupture or perforation, mitral regurgitation, acute myocardial infarction, arrhythmia, heart block or bradycardia requiring permanent pacemaker placement, congestive heart failure, respiratory failure, renal failure, pneumonia, infection, other late complications related to structural valve deterioration or migration, or other complications that might ultimately cause a temporary or permanent loss of functional independence or other long term morbidity. The patient provides full informed consent for the procedure as described and all questions were answered.   Plan:   Transfemoral transcatheter valve in valve aortic valve replacement.   Gaye Pollack, MD

## 2021-11-30 NOTE — Progress Notes (Signed)
Anesthesia Chart Review:  Case: [id number] Date/Time: 12/01/21 0900   Procedures:      TRANSCATHETER AORTIC VALVE REPLACEMENT, TRANSFEMORAL     INTRAOPERATIVE TRANSTHORACIC ECHOCARDIOGRAM   Anesthesia type: Monitor Anesthesia Care   Pre-op diagnosis: Severe Bioprosthetic Aortic Stenosis   [facility] CATH [facility] / [facility] INVAS[facility] CV [facility]   Providers: [name], MD     CT Surgeon: Evelene Croon, MD  DISCUSSION: Patient is a 66 year old female scheduled for the above procedure. She has severe bioprosthetic AS from pericardial tissue AVR in 2013. 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   History includes former smoker (quit 01/06/12), bicuspid AV with severe AI s/p AVR 3mm pericardial tissues valve with closure of PFO 01/26/12; severe prosthetic AV stenosis 09/11/21), DM2, HTN, chronic diastolic CHF, hiatal hernia, glaucoma. 4.6 cm ascending thoracic aortic aneurysm 10/15/21 CTA. A nuclear stress test on 09/23/2021 showed findings consistent with prior inferior MI with moderate peri-infarct ischemia, although 10/02/21 cardiac cath showed only mild luminal coronary irregularities.  She is a Scientist, product/process development. Reportedly she had transient apnea ("couple of seconds") post-anesthesia for colonoscopy in 2020, but did not require intubation.   11/27/2021 presurgical COVID-19 test negative.  Anesthesia team to evaluate on the day of surgery.   VS: BP (!) 123/92    Pulse 87    Temp 36.7 C (Oral)    Ht 5' 6.5" (1.689 m)    Wt 78.8 kg    LMP  (LMP Unknown)    SpO2 98%    BMI 27.62 kg/m   PROVIDERS: Suzan Slick, MD is PCP  Nona Dell, MD is cardiologist   LABS: Labs reviewed: Acceptable for surgery. (all labs ordered are listed, but only abnormal results are displayed)  Labs Reviewed  GLUCOSE, CAPILLARY - Abnormal; Notable for the following components:      Result Value   Glucose-Capillary 160 (*)    All other components within normal  limits  COMPREHENSIVE METABOLIC PANEL - Abnormal; Notable for the following components:   Glucose, Bld 111 (*)    All other components within normal limits  URINALYSIS, ROUTINE W REFLEX MICROSCOPIC - Abnormal; Notable for the following components:   APPearance HAZY (*)    Leukocytes,Ua SMALL (*)    Bacteria, UA RARE (*)    Non Squamous Epithelial 0-5 (*)    All other components within normal limits  BLOOD GAS, ARTERIAL - Abnormal; Notable for the following components:   Allens test (pass/fail) BRACHIAL ARTERY (*)    All other components within normal limits  HEMOGLOBIN A1C - Abnormal; Notable for the following components:   Hgb A1c MFr Bld 5.8 (*)    All other components within normal limits  SARS CORONAVIRUS 2 (TAT 6-24 HRS)  SURGICAL PCR SCREEN  CBC  PROTIME-INR  BRAIN NATRIURETIC PEPTIDE  NO BLOOD PRODUCTS    IMAGES: CXR 11/28/11: IMPRESSION: Negative for acute cardiopulmonary disease. Surgical changes of median sternotomy and prior aortic valve repair.  CTA Chest/abd/pelvis 10/15/21: IMPRESSION: 1. Vascular findings and measurements pertinent to potential TAVR procedure, as detailed above. 2. Status post aortic valve replacement with stented bioprosthesis which demonstrates calcifications of the valve cusps, compatible with reported clinical history of severe aortic stenosis. 3. Aortic atherosclerosis, in addition to 2 vessel coronary artery disease. There is also aneurysmal dilatation of the ascending thoracic aorta which measures up to 4.6 cm in diameter. Ascending thoracic aortic aneurysm. Recommend semi-annual imaging followup by CTA  or MRA and referral to cardiothoracic surgery if not already obtained. This recommendation follows 2010 ACCF/AHA/AATS/ACR/ASA/SCA/SCAI/SIR/STS/SVM Guidelines for the Diagnosis and Management of Patients With Thoracic Aortic Disease. Circulation. 2010; 121: Y503-T465. Aortic aneurysm NOS (ICD10-I71.9). 4. Hepatic steatosis. 5.  Additional incidental findings, as above. [See full report]    EKG: 11/27/21: Normal sinus rhythm with sinus arrhythmia ST & T wave abnormality, consider anterolateral ischemia Abnormal ECG Confirmed by Lennie Odor [phone]) on 11/27/2021 4:52:29 PM   CV: CT Coronary 10/15/21: IMPRESSION: 1. Prosthetic Aortic stenosis. Findings pertinent to TAVR procedure are detailed above. 2. Patient's total coronary artery calcium score is 215, which is 89th percentile for subjects of the same age, gender, and race based populations. 3. Moderate ascending thoracic aortic aneurysm, 46.5 mm maximal diameter. Consider serial follow up to evaluate expansion if clinically indicated. 4.  Aortic atherosclerosis.   RHC/LHC 10/02/21:   There is severe aortic valve stenosis.   Patent coronary arteries with mild luminal irregularities, no significant stenoses Known severe bioprosthetic aortic stenosis Normal right heart pressures Recommend: Cardiac and peripheral CTA studies, cardiac surgical consultations to review treatment options (redo surgery versus valve-in-valve TAVR)   TEE 09/30/21: IMPRESSIONS   1. A 43mm Edwards Magna Ease pericardial tissue valve is present in the  aortic position. Procedure date 01/26/2012. There is severe thickening and  calcification of the bioprosthetic leaflet tips along the commissures with  severely restricted leaflet  motion. There is severe bioprosthetic aortic valve stenosis. AVA by 2D  planimetry is 0.63cm2 and 0.71cm2 by continuity, mean gradient ,  peak gradient , Vmax 4.86m/s, DI 0.2. Aortic valve regurgitation is  trivial.   2. Left ventricular ejection fraction, by estimation, is 60 to 65%. The  left ventricle has normal function. The left ventricle has no regional  wall motion abnormalities.   3. Right ventricular systolic function is low normal. The right  ventricular size is normal.   4. No left atrial/left atrial appendage thrombus was  detected.   5. The mitral valve is normal in structure. Mild mitral valve  regurgitation. No evidence of mitral stenosis.   6. There is a 21 mm Edwards valve present in the aortic position.  Procedure Date: 01/26/2012. There is severe bioprosthetic aortic valve  stenosis as detailed above. Trivial aortic regurgitation.   7. Aortic dilatation noted. There is mild dilatation of the ascending  aorta, measuring 38 mm.   8. The patient is status-post PFO closure with no residual shunting noted  on color doppler.    Nuclear stress test 09/23/21:   Findings are consistent with prior inferior  myocardial infarction  moderate peri-infarct ischemia.   No ST deviation was noted.   LV perfusion is abnormal.   Left ventricular function is normal. Nuclear stress EF: 69 %. The left ventricular ejection fraction is hyperdynamic (>65%).   Low to intermediate risk based on perfusion defect alone. Elevated TID may suggest component of balanced ischemia and higher risk.   US Carotid 01/09/12: Summary:  - No significant extracranial carotid artery stenosis    demonstrated. Vertebrals are patent with antegrade flow.    Past Medical History:  Diagnosis Date   Bicuspid aortic valve 2005   Chronic diastolic heart failure (HCC)    Complication of anesthesia 2020   pt states daughter told her that she was told she "stopped breathing for a couple of seconds" during colonoscopy in 2020 and was hard to wake up but no interventions were done per pt   Essential hypertension  Glaucoma    H/O hiatal hernia    Recurrent upper respiratory infection (URI)    S/P aortic valve replacement 01/26/2012   21mm [facility] Magna Ease pericardial tissue valve   S/P patent foramen ovale closure 01/26/2012   PFO found on TEE prior to AVR. Performed at the time of aortic valve replacement   Type 2 diabetes mellitus [facility](HCC)     Past Surgical History:  Procedure Laterality Date   AORTIC VALVE REPLACEMENT  01/26/2012    Procedure: AORTIC VALVE REPLACEMENT (AVR);  Surgeon: Purcell Nailslarence H Owen, MD;  [facility];  Service: Open Heart Surgery;  Laterality: N/A;   CARDIAC CATHETERIZATION  2005   Friday March 8th       pcp Dr tapper  in [place]        HYSTEROSCOPY WITH D & C N/A 09/06/2018   Procedure: DILATATION AND CURETTAGE /HYSTEROSCOPY  ENDOMETRIAL POLYPECTOMY;  Surgeon: Lazaro ArmsEure, Luther H, MD;  Location: [facility];  Service: Gynecology;  Laterality: N/A;   LEFT AND RIGHT HEART CATHETERIZATION WITH CORONARY ANGIOGRAM N/A 01/07/2012   Procedure: LEFT AND RIGHT HEART CATHETERIZATION WITH CORONARY ANGIOGRAM;  Surgeon: Wendall StadePeter C Nishan, MD;  [facility];  Service: Cardiovascular;  Laterality: N/A;   RIGHT HEART CATH AND CORONARY ANGIOGRAPHY N/A 10/02/2021   Procedure: RIGHT HEART CATH AND CORONARY ANGIOGRAPHY;  Surgeon: Tonny Bollmanooper, Michael, MD;  [facility];  Service: Cardiovascular;  Laterality: N/A;   TEE WITHOUT CARDIOVERSION  01/10/2012   Procedure: TRANSESOPHAGEAL ECHOCARDIOGRAM (TEE);  Surgeon: Pricilla RifflePaula V Ross, MD;  [facility];  Service: Cardiovascular;  Laterality: N/A;  TEE call Trish in am for time   TEE WITHOUT CARDIOVERSION N/A 09/30/2021   Procedure: TRANSESOPHAGEAL ECHOCARDIOGRAM (TEE) WITH 3D IMAGING;  Surgeon: Meriam SpraguePemberton, Heather E, MD;  [facility];  Service: Cardiovascular;  Laterality: N/A;    MEDICATIONS:  acetaminophen (TYLENOL) 500 MG tablet   aspirin EC 81 MG tablet   cetirizine (ZYRTEC) 10 MG tablet   fluticasone (FLONASE) 50 MCG/ACT nasal spray   metFORMIN (GLUCOPHAGE-XR) 500 MG 24 hr tablet   nicotine polacrilex (NICORETTE) 2 MG gum   triamterene-hydrochlorothiazide (MAXZIDE-25) 37.5-25 MG tablet    sodium chloride flush (NS) 0.9 % injection 3 mL    Shonna ChockAllison Leiana Rund, PA-C Surgical Short Stay/Anesthesiology [facility] Phone [phone] [facility] Phone [phone] 11/30/2021 9:55 AM

## 2021-11-30 NOTE — Anesthesia Preprocedure Evaluation (Addendum)
Anesthesia Evaluation  Patient identified by MRN, date of birth, ID band Patient awake    Reviewed: Allergy & Precautions, NPO status , Patient's Chart, lab work & pertinent test results  Airway Mallampati: II  TM Distance: >3 FB Neck ROM: Full    Dental  (+) Missing, Loose,    Pulmonary former smoker,    Pulmonary exam normal breath sounds clear to auscultation       Cardiovascular hypertension, + Valvular Problems/Murmurs AS  Rhythm:Regular Rate:Normal + Systolic murmurs ECHO: 62ZH Edwards Magna Ease pericardial tissue valve is present in the aortic position. Procedure date 01/26/2012. There is severe thickening and calcification of the bioprosthetic leaflet tips along the commissures with severely restricted leaflet motion. There is severe bioprosthetic aortic valve stenosis. AVA by 2D planimetry is 0.63cm2 and 0.71cm2 by continuity, mean gradient 347mHg, peak gradient 967mg, Vmax 4.47m47m DI 0.2. Aortic valve regurgitation is trivial. Left ventricular ejection fraction, by estimation, is 60 to 65%. The left ventricle has normal function. The left ventricle has no regional wall motion abnormalities. Right ventricular systolic function is low normal. The right ventricular size is normal. No left atrial/left atrial appendage thrombus was detected. The mitral valve is normal in structure. Mild mitral valve regurgitation. No evidence of mitral stenosis. There is a 21 mm Edwards valve present in the aortic position. Procedure Date: 01/26/2012. There is severe bioprosthetic aortic valve stenosis as detailed above. Trivial aortic regurgitation. Final (Updated) Page 1 of 3 Aortic dilatation noted. There is mild dilatation of the ascending aorta, measuring 38 mm. The patient is status-post PFO closure with no residual shunting noted on color doppler.   Neuro/Psych negative neurological ROS  negative psych ROS   GI/Hepatic Neg liver ROS,  hiatal hernia,   Endo/Other  diabetes, Oral Hypoglycemic Agents  Renal/GU negative Renal ROS     Musculoskeletal negative musculoskeletal ROS (+)   Abdominal   Peds  Hematology  (+) REFUSES BLOOD PRODUCTS, JEHOVAH'S WITNESS  Anesthesia Other Findings Severe Bioprosthetic Aortic Stenosis  Reproductive/Obstetrics                           Anesthesia Physical Anesthesia Plan  ASA: 4  Anesthesia Plan: MAC   Post-op Pain Management:    Induction: Intravenous  PONV Risk Score and Plan: 2 and Ondansetron, Dexamethasone and Treatment may vary due to age or medical condition  Airway Management Planned: Simple Face Mask  Additional Equipment:   Intra-op Plan:   Post-operative Plan:   Informed Consent: I have reviewed the patients History and Physical, chart, labs and discussed the procedure including the risks, benefits and alternatives for the proposed anesthesia with the patient or authorized representative who has indicated his/her understanding and acceptance.     Dental advisory given  Plan Discussed with: CRNA  Anesthesia Plan Comments: (Reviewed PAT note written 11/30/2021 by AllMyra GianottiA-C. )       Anesthesia Quick Evaluation

## 2021-12-01 ENCOUNTER — Inpatient Hospital Stay (HOSPITAL_COMMUNITY): Payer: Medicare Other

## 2021-12-01 ENCOUNTER — Inpatient Hospital Stay (HOSPITAL_COMMUNITY)
Admission: RE | Admit: 2021-12-01 | Discharge: 2021-12-02 | DRG: 267 | Disposition: A | Discharge status: Home / Self Care | Payer: Medicare Other | Attending: Cardiovascular Disease | Admitting: Cardiovascular Disease

## 2021-12-01 ENCOUNTER — Inpatient Hospital Stay (HOSPITAL_COMMUNITY): Payer: Medicare Other | Admitting: Vascular Surgery

## 2021-12-01 ENCOUNTER — Other Ambulatory Visit: Payer: Self-pay | Admitting: Physician Assistant

## 2021-12-01 ENCOUNTER — Other Ambulatory Visit: Payer: Self-pay

## 2021-12-01 ENCOUNTER — Encounter (HOSPITAL_COMMUNITY)
Admission: RE | Disposition: A | Discharge status: Home / Self Care | Payer: Medicare Other | Source: Home / Self Care | Attending: Cardiovascular Disease

## 2021-12-01 ENCOUNTER — Encounter (HOSPITAL_COMMUNITY): Payer: Self-pay | Admitting: Cardiovascular Disease

## 2021-12-01 DIAGNOSIS — Z006 Encounter for examination for normal comparison and control in clinical research program: Secondary | ICD-10-CM

## 2021-12-01 DIAGNOSIS — R079 Chest pain, unspecified: Secondary | ICD-10-CM

## 2021-12-01 DIAGNOSIS — I5032 Chronic diastolic (congestive) heart failure: Secondary | ICD-10-CM | POA: Diagnosis present

## 2021-12-01 DIAGNOSIS — E119 Type 2 diabetes mellitus without complications: Secondary | ICD-10-CM | POA: Diagnosis present

## 2021-12-01 DIAGNOSIS — I10 Essential (primary) hypertension: Secondary | ICD-10-CM

## 2021-12-01 DIAGNOSIS — Z952 Presence of prosthetic heart valve: Secondary | ICD-10-CM

## 2021-12-01 DIAGNOSIS — Z8774 Personal history of (corrected) congenital malformations of heart and circulatory system: Secondary | ICD-10-CM

## 2021-12-01 DIAGNOSIS — Z79899 Other long term (current) drug therapy: Secondary | ICD-10-CM | POA: Diagnosis not present

## 2021-12-01 DIAGNOSIS — I7121 Aneurysm of the ascending aorta, without rupture: Secondary | ICD-10-CM | POA: Diagnosis present

## 2021-12-01 DIAGNOSIS — Z8249 Family history of ischemic heart disease and other diseases of the circulatory system: Secondary | ICD-10-CM

## 2021-12-01 DIAGNOSIS — I712 Thoracic aortic aneurysm, without rupture, unspecified: Secondary | ICD-10-CM

## 2021-12-01 DIAGNOSIS — Z91010 Allergy to peanuts: Secondary | ICD-10-CM | POA: Diagnosis not present

## 2021-12-01 DIAGNOSIS — T82857A Stenosis of cardiac prosthetic devices, implants and grafts, initial encounter: Secondary | ICD-10-CM | POA: Diagnosis present

## 2021-12-01 DIAGNOSIS — I350 Nonrheumatic aortic (valve) stenosis: Secondary | ICD-10-CM

## 2021-12-01 DIAGNOSIS — Z87891 Personal history of nicotine dependence: Secondary | ICD-10-CM

## 2021-12-01 DIAGNOSIS — I959 Hypotension, unspecified: Secondary | ICD-10-CM | POA: Diagnosis not present

## 2021-12-01 DIAGNOSIS — I110 Hypertensive heart disease with heart failure: Secondary | ICD-10-CM | POA: Diagnosis present

## 2021-12-01 DIAGNOSIS — Z888 Allergy status to other drugs, medicaments and biological substances status: Secondary | ICD-10-CM

## 2021-12-01 DIAGNOSIS — I252 Old myocardial infarction: Secondary | ICD-10-CM | POA: Diagnosis not present

## 2021-12-01 DIAGNOSIS — Y831 Surgical operation with implant of artificial internal device as the cause of abnormal reaction of the patient, or of later complication, without mention of misadventure at the time of the procedure: Secondary | ICD-10-CM | POA: Diagnosis present

## 2021-12-01 DIAGNOSIS — H409 Unspecified glaucoma: Secondary | ICD-10-CM | POA: Diagnosis present

## 2021-12-01 DIAGNOSIS — I352 Nonrheumatic aortic (valve) stenosis with insufficiency: Secondary | ICD-10-CM | POA: Diagnosis present

## 2021-12-01 DIAGNOSIS — Z7984 Long term (current) use of oral hypoglycemic drugs: Secondary | ICD-10-CM

## 2021-12-01 HISTORY — DX: Presence of prosthetic heart valve: Z95.2

## 2021-12-01 HISTORY — PX: INTRAOPERATIVE TRANSTHORACIC ECHOCARDIOGRAM: SHX6523

## 2021-12-01 HISTORY — DX: Type 2 diabetes mellitus without complications: E11.9

## 2021-12-01 HISTORY — PX: TRANSCATHETER AORTIC VALVE REPLACEMENT, TRANSFEMORAL: SHX6400

## 2021-12-01 LAB — ECHOCARDIOGRAM LIMITED
AR max vel: 1.79 cm2
AV Area VTI: 1.9 cm2
AV Area mean vel: 1.93 cm2
AV Mean grad: 11.5 mmHg
AV Peak grad: 22.4 mmHg
Ao pk vel: 2.37 m/s

## 2021-12-01 LAB — POCT I-STAT, CHEM 8
BUN: 20 mg/dL (ref 8–23)
BUN: 20 mg/dL (ref 8–23)
BUN: 20 mg/dL (ref 8–23)
Calcium, Ion: 1.34 mmol/L (ref 1.15–1.40)
Calcium, Ion: 1.29 mmol/L (ref 1.15–1.40)
Calcium, Ion: 1.29 mmol/L (ref 1.15–1.40)
Chloride: 103 mmol/L (ref 98–111)
Chloride: 104 mmol/L (ref 98–111)
Chloride: 105 mmol/L (ref 98–111)
Creatinine, Ser: 0.9 mg/dL (ref 0.44–1.00)
Creatinine, Ser: 1 mg/dL (ref 0.44–1.00)
Creatinine, Ser: 0.9 mg/dL (ref 0.44–1.00)
Glucose, Bld: 135 mg/dL — ABNORMAL HIGH (ref 70–99)
Glucose, Bld: 174 mg/dL — ABNORMAL HIGH (ref 70–99)
Glucose, Bld: 160 mg/dL — ABNORMAL HIGH (ref 70–99)
HCT: 35 % — ABNORMAL LOW (ref 36.0–46.0)
HCT: 37 % (ref 36.0–46.0)
HCT: 37 % (ref 36.0–46.0)
Hemoglobin: 11.9 g/dL — ABNORMAL LOW (ref 12.0–15.0)
Hemoglobin: 12.6 g/dL (ref 12.0–15.0)
Hemoglobin: 12.6 g/dL (ref 12.0–15.0)
Potassium: 3.4 mmol/L — ABNORMAL LOW (ref 3.5–5.1)
Potassium: 3.5 mmol/L (ref 3.5–5.1)
Potassium: 3.8 mmol/L (ref 3.5–5.1)
Sodium: 142 mmol/L (ref 135–145)
Sodium: 142 mmol/L (ref 135–145)
Sodium: 142 mmol/L (ref 135–145)
TCO2: 28 mmol/L (ref 22–32)
TCO2: 28 mmol/L (ref 22–32)
TCO2: 26 mmol/L (ref 22–32)

## 2021-12-01 LAB — POCT ACTIVATED CLOTTING TIME
Activated Clotting Time: 144 seconds
Activated Clotting Time: 263 seconds
Activated Clotting Time: 299 seconds
Activated Clotting Time: 137 seconds

## 2021-12-01 LAB — GLUCOSE, CAPILLARY
Glucose-Capillary: 118 mg/dL — ABNORMAL HIGH (ref 70–99)
Glucose-Capillary: 134 mg/dL — ABNORMAL HIGH (ref 70–99)
Glucose-Capillary: 227 mg/dL — ABNORMAL HIGH (ref 70–99)

## 2021-12-01 LAB — NO BLOOD PRODUCTS

## 2021-12-01 LAB — MRSA NEXT GEN BY PCR, NASAL: MRSA by PCR Next Gen: NOT DETECTED

## 2021-12-01 SURGERY — IMPLANTATION, AORTIC VALVE, TRANSCATHETER, FEMORAL APPROACH
Anesthesia: Monitor Anesthesia Care

## 2021-12-01 MED ORDER — SODIUM CHLORIDE 0.9 % IV SOLN: INTRAVENOUS | Status: DC

## 2021-12-01 MED ORDER — ACETAMINOPHEN 325 MG PO TABS
650.0000 mg | ORAL_TABLET | Freq: Four times a day (QID) | ORAL | Status: DC | PRN
  Administered 2021-12-01 – 2021-12-02 (×2): 650 mg via ORAL
  Filled 2021-12-01 (×2): qty 2

## 2021-12-01 MED ORDER — HEPARIN SODIUM (PORCINE) 1000 UNIT/ML IJ SOLN
INTRAMUSCULAR | Status: DC | PRN
Start: 2021-12-01 — End: 2021-12-01
  Administered 2021-12-01: 12000 [IU] via INTRAVENOUS

## 2021-12-01 MED ORDER — HEPARIN (PORCINE) IN NACL 1000-0.9 UT/500ML-% IV SOLN
INTRAVENOUS | Status: DC | PRN
  Administered 2021-12-01 (×2): 500 mL

## 2021-12-01 MED ORDER — MIDAZOLAM HCL 2 MG/2ML IJ SOLN
INTRAMUSCULAR | Status: DC | PRN
  Administered 2021-12-01: 1 mg via INTRAVENOUS

## 2021-12-01 MED ORDER — CHLORHEXIDINE GLUCONATE CLOTH 2 % EX PADS
6.0000 | MEDICATED_PAD | Freq: Every day | CUTANEOUS | Status: DC
  Administered 2021-12-01 – 2021-12-02 (×2): 6 via TOPICAL

## 2021-12-01 MED ORDER — FENTANYL CITRATE (PF) 100 MCG/2ML IJ SOLN
INTRAMUSCULAR | Status: DC | PRN
  Administered 2021-12-01: 50 ug via INTRAVENOUS

## 2021-12-01 MED ORDER — NOREPINEPHRINE 4 MG/250ML-% IV SOLN
INTRAVENOUS | Status: DC | PRN
  Administered 2021-12-01: 3 ug/min via INTRAVENOUS

## 2021-12-01 MED ORDER — ASPIRIN 81 MG PO CHEW
81.0000 mg | CHEWABLE_TABLET | Freq: Every day | ORAL | Status: DC
  Administered 2021-12-02: 81 mg via ORAL
  Filled 2021-12-01: qty 1

## 2021-12-01 MED ORDER — SODIUM CHLORIDE 0.9 % IV SOLN: INTRAVENOUS | Status: AC

## 2021-12-01 MED ORDER — LACTATED RINGERS IV SOLN
INTRAVENOUS | Status: DC | PRN
Start: 2021-12-01 — End: 2021-12-01

## 2021-12-01 MED ORDER — PHENYLEPHRINE 40 MCG/ML (10ML) SYRINGE FOR IV PUSH (FOR BLOOD PRESSURE SUPPORT)
PREFILLED_SYRINGE | INTRAVENOUS | Status: DC | PRN
  Administered 2021-12-01 (×2): 80 ug via INTRAVENOUS
  Administered 2021-12-01: 40 ug via INTRAVENOUS
  Administered 2021-12-01: 20 ug via INTRAVENOUS
  Administered 2021-12-01 (×2): 80 ug via INTRAVENOUS

## 2021-12-01 MED ORDER — SODIUM CHLORIDE 0.9% FLUSH
3.0000 mL | Freq: Two times a day (BID) | INTRAVENOUS | Status: DC
  Administered 2021-12-01: 3 mL via INTRAVENOUS

## 2021-12-01 MED ORDER — ONDANSETRON HCL 4 MG/2ML IJ SOLN: 4.0000 mg | Freq: Four times a day (QID) | INTRAMUSCULAR | Status: DC | PRN

## 2021-12-01 MED ORDER — CEFAZOLIN SODIUM-DEXTROSE 2-4 GM/100ML-% IV SOLN
2.0000 g | Freq: Three times a day (TID) | INTRAVENOUS | Status: AC
  Administered 2021-12-01 – 2021-12-02 (×2): 2 g via INTRAVENOUS
  Filled 2021-12-01 (×2): qty 100

## 2021-12-01 MED ORDER — PROPOFOL 500 MG/50ML IV EMUL
INTRAVENOUS | Status: DC | PRN
  Administered 2021-12-01: 25 ug/kg/min via INTRAVENOUS

## 2021-12-01 MED ORDER — LIDOCAINE HCL 1 % IJ SOLN
INTRAMUSCULAR | Status: AC
  Filled 2021-12-01: qty 20

## 2021-12-01 MED ORDER — SODIUM CHLORIDE 0.9% FLUSH: 3.0000 mL | INTRAVENOUS | Status: DC | PRN

## 2021-12-01 MED ORDER — SODIUM CHLORIDE 0.9 % IV SOLN: 250.0000 mL | INTRAVENOUS | Status: DC | PRN

## 2021-12-01 MED ORDER — DEXAMETHASONE SODIUM PHOSPHATE 10 MG/ML IJ SOLN
INTRAMUSCULAR | Status: DC | PRN
  Administered 2021-12-01: 4 mg via INTRAVENOUS

## 2021-12-01 MED ORDER — INSULIN ASPART 100 UNIT/ML IJ SOLN
0.0000 [IU] | Freq: Every day | INTRAMUSCULAR | Status: DC
  Administered 2021-12-01: 2 [IU] via SUBCUTANEOUS

## 2021-12-01 MED ORDER — LIDOCAINE HCL (PF) 1 % IJ SOLN
INTRAMUSCULAR | Status: DC | PRN
  Administered 2021-12-01 (×2): 12 mL

## 2021-12-01 MED ORDER — ACETAMINOPHEN 650 MG RE SUPP: 650.0000 mg | Freq: Four times a day (QID) | RECTAL | Status: DC | PRN

## 2021-12-01 MED ORDER — SODIUM CHLORIDE 0.9 % IV SOLN: 250.0000 mL | INTRAVENOUS | Status: DC

## 2021-12-01 MED ORDER — NITROGLYCERIN IN D5W 200-5 MCG/ML-% IV SOLN: 0.0000 ug/min | INTRAVENOUS | Status: DC

## 2021-12-01 MED ORDER — MORPHINE SULFATE (PF) 2 MG/ML IV SOLN: 1.0000 mg | INTRAVENOUS | Status: DC | PRN

## 2021-12-01 MED ORDER — NOREPINEPHRINE 4 MG/250ML-% IV SOLN: 2.0000 ug/min | INTRAVENOUS | Status: DC

## 2021-12-01 MED ORDER — CHLORHEXIDINE GLUCONATE 4 % EX LIQD: 30.0000 mL | CUTANEOUS | Status: DC

## 2021-12-01 MED ORDER — CHLORHEXIDINE GLUCONATE 4 % EX LIQD: 60.0000 mL | Freq: Once | CUTANEOUS | Status: DC

## 2021-12-01 MED ORDER — CHLORHEXIDINE GLUCONATE 0.12 % MT SOLN
15.0000 mL | Freq: Once | OROMUCOSAL | Status: DC
  Filled 2021-12-01: qty 15

## 2021-12-01 MED ORDER — SODIUM CHLORIDE 0.9 % IV BOLUS: 500.0000 mL | Freq: Once | INTRAVENOUS | Status: DC

## 2021-12-01 MED ORDER — PROTAMINE SULFATE 10 MG/ML IV SOLN
INTRAVENOUS | Status: DC | PRN
  Administered 2021-12-01: 120 mg via INTRAVENOUS

## 2021-12-01 MED ORDER — OXYCODONE HCL 5 MG PO TABS: 5.0000 mg | ORAL_TABLET | ORAL | Status: DC | PRN

## 2021-12-01 MED ORDER — INSULIN ASPART 100 UNIT/ML IJ SOLN
0.0000 [IU] | Freq: Three times a day (TID) | INTRAMUSCULAR | Status: DC
  Administered 2021-12-02: 3 [IU] via SUBCUTANEOUS
  Administered 2021-12-02: 2 [IU] via SUBCUTANEOUS

## 2021-12-01 MED ORDER — CHLORHEXIDINE GLUCONATE 0.12 % MT SOLN
15.0000 mL | OROMUCOSAL | Status: AC
  Administered 2021-12-01: 15 mL via OROMUCOSAL
  Filled 2021-12-01: qty 15

## 2021-12-01 MED ORDER — TRAMADOL HCL 50 MG PO TABS: 50.0000 mg | ORAL_TABLET | ORAL | Status: DC | PRN

## 2021-12-01 MED ORDER — HEPARIN (PORCINE) IN NACL 1000-0.9 UT/500ML-% IV SOLN
INTRAVENOUS | Status: AC
  Filled 2021-12-01: qty 1500

## 2021-12-01 MED ORDER — NOREPINEPHRINE 4 MG/250ML-% IV SOLN: 0.0000 ug/min | INTRAVENOUS | Status: DC

## 2021-12-01 MED ORDER — ONDANSETRON HCL 4 MG/2ML IJ SOLN
INTRAMUSCULAR | Status: DC | PRN
  Administered 2021-12-01: 4 mg via INTRAVENOUS

## 2021-12-01 MED ORDER — LACTATED RINGERS IV SOLN: INTRAVENOUS | Status: DC

## 2021-12-01 SURGICAL SUPPLY — 39 items
BAG SNAP BAND KOVER 36X36 (MISCELLANEOUS) ×5 IMPLANT
BALLN TRUE 20X4.5 (BALLOONS) ×2
BALLN TRUE 22X4.5 (BALLOONS) ×2
BALLOON TRUE 20X4.5 (BALLOONS) IMPLANT
BALLOON TRUE 22X4.5 (BALLOONS) IMPLANT
BLANKET WARM UNDERBOD FULL ACC (MISCELLANEOUS) ×2 IMPLANT
CABLE ADAPT PACING TEMP 12FT (ADAPTER) ×1 IMPLANT
CATH DIAG 6FR PIGTAIL ANGLED (CATHETERS) ×1 IMPLANT
CATH INFINITI 5 FR RCB (CATHETERS) ×1 IMPLANT
CATH INFINITI 6F AL1 (CATHETERS) ×1 IMPLANT
CATH INFINITI 6F AL2 (CATHETERS) ×1 IMPLANT
CATH S G BIP PACING (CATHETERS) ×1 IMPLANT
CLOSURE MYNX CONTROL 6F/7F (Vascular Products) ×1 IMPLANT
CLOSURE PERCLOSE PROSTYLE (VASCULAR PRODUCTS) ×2 IMPLANT
DRYSEAL FLEXSHEATH 18FR 33CM (SHEATH) ×1
GUIDEWIRE CNFDA BRKR CVD (WIRE) ×1 IMPLANT
GUIDEWIRE INQWIRE 1.5J.035X260 (WIRE) IMPLANT
INQWIRE 1.5J .035X260CM (WIRE) ×2
KIT ENCORE 26 ADVANTAGE (KITS) ×1 IMPLANT
KIT HEART LEFT (KITS) ×2 IMPLANT
KIT MICROPUNCTURE NIT STIFF (SHEATH) ×1 IMPLANT
PACK CARDIAC CATHETERIZATION (CUSTOM PROCEDURE TRAY) ×2 IMPLANT
SHEATH BRITE TIP 7FR 35CM (SHEATH) ×1 IMPLANT
SHEATH DRYSEAL FLEX 18FR 33CM (SHEATH) IMPLANT
SHEATH PINNACLE 6F 10CM (SHEATH) ×1 IMPLANT
SHEATH PINNACLE 8F 10CM (SHEATH) ×1 IMPLANT
SHEATH PROBE COVER 6X72 (BAG) ×1 IMPLANT
SLEEVE REPOSITIONING LENGTH 30 (MISCELLANEOUS) ×1 IMPLANT
STOPCOCK MORSE 400PSI 3WAY (MISCELLANEOUS) ×4 IMPLANT
SYS EVOLUT FX DELIVERY 23-29 (CATHETERS) ×2
SYS EVOLUT FX LOADING 23-29 (CATHETERS) ×2
SYSTEM EVOLUT FX DELIVRY 23-29 (CATHETERS) IMPLANT
SYSTEM EVOLUT FX LOADING 23-29 (CATHETERS) IMPLANT
TRANSDUCER W/STOPCOCK (MISCELLANEOUS) ×4 IMPLANT
TUBING CONTRAST HIGH PRESS 48 (TUBING) ×1 IMPLANT
VALVE EVOLUT FX 23 (Valve) ×1 IMPLANT
WIRE AMPLATZ SS-J .035X180CM (WIRE) ×1 IMPLANT
WIRE EMERALD 3MM-J .035X150CM (WIRE) ×1 IMPLANT
WIRE EMERALD ST .035X260CM (WIRE) ×1 IMPLANT

## 2021-12-01 NOTE — Progress Notes (Signed)
Noreene Larsson, NP at bedside, aware of pt BP and HR, levophed gtt still in place, see MAR, safety maintained

## 2021-12-01 NOTE — Anesthesia Postprocedure Evaluation (Signed)
Anesthesia Post Note  Patient: Courtney Harvey  Procedure(s) Performed: TRANSCATHETER AORTIC VALVE REPLACEMENT, TRANSFEMORAL INTRAOPERATIVE TRANSTHORACIC ECHOCARDIOGRAM     Patient location during evaluation: Cath Lab Anesthesia Type: MAC Level of consciousness: awake Pain management: pain level controlled Vital Signs Assessment: post-procedure vital signs reviewed and stable Respiratory status: spontaneous breathing, nonlabored ventilation, respiratory function stable and patient connected to nasal cannula oxygen Cardiovascular status: stable and blood pressure returned to baseline Postop Assessment: no apparent nausea or vomiting Anesthetic complications: no   No notable events documented.  Last Vitals:  Vitals:   12/01/21 1855 12/01/21 1910  BP: 125/77 118/70  Pulse: (!) 56 (!) 58  Resp: 18 19  Temp:    SpO2: 98% 96%    Last Pain:  Vitals:   12/01/21 1910  TempSrc:   PainSc: 0-No pain                 Morley Gaumer P Mylin Hirano

## 2021-12-01 NOTE — Progress Notes (Signed)
°  [facility]   MULTIDISCIPLINARY HEART VALVE TEAM   Patient unable to come off Levophed drip despite NS bolus after several attempts. Aline SBP into the 70's that stabilized after increase in infusion. Given this, she will require an ICU bed overnight. Patient otherwise doing very well post TAVR. Plan to titrate Levophed off this evening.   Georgie Chard NP-C Structural Heart Team  Pager: [phone] Phone: [phone]

## 2021-12-01 NOTE — Progress Notes (Signed)
Dr Burt Knack to cath lab holding to see pt, feels she may need more time for BP to adjust, levophed gtt in place, see MAR, continue with transfer to [place], safety maintained

## 2021-12-01 NOTE — Consult Note (Signed)
[facility]   MULTIDISCIPLINARY HEART VALVE TEAM  Date:  12/01/2021   ID:  Courtney Harvey, DOB 1955/11/21, MRN [id number]  PCP:  Suzan Slick, MD   No chief complaint on file.    HISTORY OF PRESENT ILLNESS: Courtney Harvey is a 66 y.o. female who presents for TAVR after undergoing multidisciplinary evaluation.  The patient has a history of bicuspid aortic valve disease treated with aortic valve replacement using a 21 mm [facility] Ease pericardial tissue valve in 2013.  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  The patient has developed severe prosthetic aortic stenosis with a mean transvalvular gradient of 61 mmHg and peak gradient of 104 mmHg.  She has had hyperdynamic LV function with LVEF 70 to 75%.  TEE confirmed severe prosthetic aortic valve stenosis with only trivial aortic insufficiency.  Cardiac catheterization showed patent coronary arteries with minimal irregularity and normal right heart pressures.  The patient was evaluated by Dr. Laneta Simmers and felt to be at prohibitive risk of surgery, which would include redo aortic valve replacement and replacement of the a sending aortic aneurysm in a patient who is a TEFL teacher Witness and adamantly refuses any blood products.  She is therefore referred for valve in valve TAVR.  The patient's symptoms include exertional dyspnea and lightheadedness which have been slowly progressive and currently are New York Heart Association functional class II.  She denies orthopnea, PND, or chest pain.  Past Medical History:  Diagnosis Date   Bicuspid aortic valve 2005   Chronic diastolic heart failure (HCC)    Complication of anesthesia 2020   pt states daughter told her that she was told she "stopped breathing for a couple of seconds" during colonoscopy in 2020 and was hard to wake up but no interventions were done per pt   Essential hypertension    Glaucoma    H/O hiatal hernia    Recurrent upper  respiratory infection (URI)    S/P aortic valve replacement 01/26/2012   71mm [facility] Ease pericardial tissue valve   S/P patent foramen ovale closure 01/26/2012   PFO found on TEE prior to AVR. Performed at the time of aortic valve replacement   Type 2 diabetes mellitus (HCC)     Current Facility-Administered Medications  Medication Dose Route Frequency Provider Last Rate Last Admin   [START ON 12/02/2021] 0.9 %  sodium chloride infusion   Intravenous Continuous [name], MD       ceFAZolin (ANCEF) IVPB 2g/100 mL premix  2 g Intravenous To OR [name], MD       chlorhexidine (HIBICLENS) 4 % liquid 2 application  30 mL Topical UD [name], MD       chlorhexidine (HIBICLENS) 4 % liquid 4 application  60 mL Topical Once [name], MD       And   [START ON 12/02/2021] chlorhexidine (HIBICLENS) 4 % liquid 4 application  60 mL Topical Once [name], MD       Melene Muller ON 12/02/2021] chlorhexidine (PERIDEX) 0.12 % solution 15 mL  15 mL Mouth/Throat Once [name], MD       dexmedetomidine (PRECEDEX) 400 MCG/100ML (4 mcg/mL) infusion  0.1-0.7 mcg/kg/hr Intravenous To OR [name], MD       heparin 30,000 units/NS 1000 mL solution for CELLSAVER   Other To OR [name], MD       lactated ringers infusion   Intravenous Continuous Ellender, Catheryn Bacon, MD  magnesium sulfate (IV Push/IM) injection 40 mEq  40 mEq Other To OR Tonny Bollmanooper, Laureen Frederic, MD       norepinephrine (LEVOPHED) 4mg  in 250mL (0.016 mg/mL) premix infusion  0-10 mcg/min Intravenous To OR Tonny Bollmanooper, Osmin Welz, MD       potassium chloride injection 80 mEq  80 mEq Other To OR Tonny Bollmanooper, Charlsey Moragne, MD        ALLERGIES:   Peanut-containing drug products and Wellbutrin [bupropion hcl]   SOCIAL HISTORY:  The patient  reports that she quit smoking about 9 years ago. Her smoking use included cigarettes. She has a 40.00 pack-year smoking history. She has never used smokeless tobacco. She reports that she does  not drink alcohol and does not use drugs.   FAMILY HISTORY:  The patient's family history includes Cancer in her sister; Hypertension in her brother and son; Lung cancer in her father; Uterine cancer in her mother.   REVIEW OF SYSTEMS:  Positive for fatigue, shortness of breath, blurry vision.   All other systems are reviewed and negative.   PHYSICAL EXAM: VS:  BP 119/79    Pulse 64    Temp 97.7 F (36.5 C) (Oral)    Resp 17    Ht 5' 6.5" (1.689 m)    Wt 78.8 kg    LMP  (LMP Unknown)    SpO2 97%    BMI 27.62 kg/m  , BMI Body mass index is 27.62 kg/m. GEN: Well nourished, well developed, in no acute distress HEENT: normal Neck: No JVD. carotids 2+ without bruits or masses Cardiac: The heart is RRR with 3/6 harsh crescendo decrescendo murmur at the right upper sternal border no edema. Pedal pulses 2+ = bilaterally  Respiratory:  clear to auscultation bilaterally GI: soft, nontender, nondistended, + BS MS: no deformity or atrophy Skin: warm and dry, no rash Neuro:  Strength and sensation are intact Psych: euthymic mood, full affect  EKG:  EKG from 11/27/2021 reviewed and demonstrates normal sinus rhythm 72 bpm, ST and T wave abnormality consider lateral ischemia  RECENT LABS: 11/27/2021: ALT 25; B Natriuretic Peptide 45.0; BUN 21; Creatinine, Ser 0.96; Hemoglobin 13.4; Platelets 186; Potassium 3.6; Sodium 136  No results found for requested labs within last 8760 hours.   Estimated Creatinine Clearance: 61.7 mL/min (by C-G formula based on SCr of 0.96 mg/dL).   Wt Readings from Last 3 Encounters:  12/01/21 78.8 kg  11/27/21 78.8 kg  11/20/21 80.3 kg     CARDIAC STUDIES: Echo:  1. Left ventricular ejection fraction, by estimation, is 70 to 75%. The  left ventricle has hyperdynamic function. The left ventricle has no  regional wall motion abnormalities. There is severe asymmetric left  ventricular hypertrophy of the septal  segment. Left ventricular diastolic parameters are  consistent with Grade I  diastolic dysfunction (impaired relaxation).   2. Right ventricular systolic function is normal. The right ventricular  size is normal. There is normal pulmonary artery systolic pressure. The  estimated right ventricular systolic pressure is 24.0 mmHg.   3. The mitral valve is grossly normal. Mild mitral valve regurgitation.   4. The aortic valve has been repaired/replaced. Aortic valve  regurgitation is not visualized. There is a [facility] Magn[facility]ase  pericardial tissue valve present in the aortic position. Procedure Date:  01/26/12. Aortic valve mean gradient measures 61.0  mmHg (up from 19.5 mmHg). Dimentionless index 0.22. This gradient has  increased substantially since last study. Prosthetic leaflet motion not  well visualized. Question some contribution  from LVOT, but would be  concerned about significant prosthetic  stenosis and suggest TEE for futher evaluation.   5. The inferior vena cava is normal in size with greater than 50%  respiratory variability, suggesting right atrial pressure of 3 mmHg.   Comparison(s): Prior images reviewed side by side. See above discussion  regarding suspected prosthetic aortic stenosis. Suggest TEE for further  evaluation (would arrange at [facility] so that 3D TEE images can be  obtained).   Cardiac Cath:  Conclusion      There is severe aortic valve stenosis.   Patent coronary arteries with mild luminal irregularities, no significant stenoses Known severe bioprosthetic aortic stenosis Normal right heart pressures   Recommend: Cardiac and peripheral CTA studies, cardiac surgical consultations to review treatment options (redo surgery versus valve-in-valve TAVR)  CT angiogram chest/abdomen/pelvis: IMPRESSION: 1. Vascular findings and measurements pertinent to potential TAVR procedure, as detailed above. 2. Status post aortic valve replacement with stented bioprosthesis which demonstrates calcifications of the  valve cusps, compatible with reported clinical history of severe aortic stenosis. 3. Aortic atherosclerosis, in addition to 2 vessel coronary artery disease. There is also aneurysmal dilatation of the ascending thoracic aorta which measures up to 4.6 cm in diameter. Ascending thoracic aortic aneurysm. Recommend semi-annual imaging followup by CTA or MRA and referral to cardiothoracic surgery if not already obtained. This recommendation follows 2010 ACCF/AHA/AATS/ACR/ASA/SCA/SCAI/SIR/STS/SVM Guidelines for the Diagnosis and Management of Patients With Thoracic Aortic Disease. Circulation. 2010; 121: Z610-R604. Aortic aneurysm NOS (ICD10-I71.9). 4. Hepatic steatosis. 5. Additional incidental findings, as above.   ASSESSMENT AND PLAN: 1.  Severe/symptomatic bioprosthetic aortic valve stenosis with New York Heart Association functional class II symptoms of exertional dyspnea and fatigue consistent with chronic diastolic heart failure  All treatment options have been considered.  The patient is not felt to be a candidate for redo surgical aortic valve replacement and ascending aortic replacement because of her Jehovah's Witness faith and refusal to accept any blood products.  Valve in valve TAVR is a reasonable treatment option in this circumstance to alleviate the patient's heart failure symptoms, severe bioprosthetic aortic stenosis, and improve mortality.  All of her preoperative imaging has been carefully reviewed with our multidisciplinary heart valve team.  She is felt to be a suitable candidate for valve in valve TAVR with a supra annular [employer] evolute fracture valve via transfemoral access.  Risks, indications, and alternatives to surgery have been carefully reviewed with the patient who understands and agrees to proceed, providing full informed consent for the procedure.  Enzo Bi 12/01/2021 8:10 AM     [facility] [address] [place]  [address]  [phone] (office) [phone] (fax)

## 2021-12-01 NOTE — Progress Notes (Signed)
°  [facility]   MULTIDISCIPLINARY HEART VALVE TEAM  Patient doing well s/p TAVR however remains fairly sleepy after anesthesia. She continues to require very low dose Levophed post procedure with SBP in the 120 range at this time. Groin sites stable. ECG and telemetry with SB in the 40's but no high grade block or LBBB/RBBB. Plan to DC arterial line and transfer to 4E if able to wean Levophed. If unable after re-assessment, will plan to leave Aline and transfer to 2H overnight.    Georgie Chard NP-C Structural Heart Team  Pager: [phone] Phone: [phone]

## 2021-12-01 NOTE — Progress Notes (Signed)
An USGPIV (ultrasound guided PIV) has been placed for short-term vasopressor infusion. A correctly placed ivWatch must be used when administering Vasopressors. Should this treatment be needed beyond 72 hours, central line access should be obtained.  It will be the responsibility of the bedside nurse to follow best practice to prevent extravasations.   ?

## 2021-12-01 NOTE — Progress Notes (Signed)
Supervisor, Dorothyann Gibbs, informed that cardiac monitor in [place], cath lab holding, not working properly, VS not transferring to [employer] automatically and not recording a-line reading or Co2 reading, RN entering VS manually, monitor paired/connected on pt arrival to unit

## 2021-12-01 NOTE — Anesthesia Procedure Notes (Signed)
Procedure Name: MAC Date/Time: 12/01/2021 9:46 AM Performed by: Carolan Clines, CRNA Patient Re-evaluated:Patient Re-evaluated prior to induction Oxygen Delivery Method: Simple face mask Induction Type: IV induction Comments: Facemask with EtCo2 applied to patient prior to induction

## 2021-12-01 NOTE — Progress Notes (Signed)
Spoke with Noreene Larsson, NP regarding pt's BP, she will talk with Dr. Excell Seltzer and let us know about bed assignment

## 2021-12-01 NOTE — Progress Notes (Signed)
°  Echocardiogram 2D Echocardiogram has been performed.  Leta Jungling M 12/01/2021, 11:46 AM

## 2021-12-01 NOTE — Anesthesia Procedure Notes (Signed)
Arterial Line Insertion Start/End1/31/2023 8:35 AM, 12/01/2021 8:45 AM Performed by: Leonides Grills, MD, anesthesiologist  Patient location: Pre-op. Preanesthetic checklist: patient identified, IV checked, site marked, risks and benefits discussed, surgical consent, monitors and equipment checked, pre-op evaluation, timeout performed and anesthesia consent Lidocaine 1% used for infiltration Right, radial was placed Catheter size: 20 G Hand hygiene performed , maximum sterile barriers used  and Seldinger technique used  Attempts: 1 (Previous attempts by [facility] and CRNA) Procedure performed using ultrasound guided technique. Ultrasound Notes:anatomy identified, needle tip was noted to be adjacent to the nerve/plexus identified and no ultrasound evidence of intravascular and/or intraneural injection Following insertion, dressing applied and Biopatch. Post procedure assessment: normal and unchanged  Post procedure complications: second provider assisted. Patient tolerated the procedure well with no immediate complications.

## 2021-12-01 NOTE — Op Note (Signed)
[facility]   MULTIDISCIPLINARY HEART VALVE TEAM   TAVR OPERATIVE NOTE   Date of Procedure:  12/01/2021   Preoperative Diagnosis: Severe Aortic Stenosis   Postoperative Diagnosis: Same   Procedure:   Transcatheter Aortic Valve Replacement (Valve-In-Valve) - Percutaneous  Transfemoral Approach  Medtronic Evolut Pro-Plus (size 23 mm, serial # [id number])   Co-Surgeons:  Gaye Pollack, MD and Sherren Mocha, MD  Anesthesiologist:  Adele Barthel, MD  Echocardiographer:  Jenkins Rouge, MD  Pre-operative Echo Findings: Severe bioprosthetic aortic stenosis Normal left ventricular systolic function  Post-operative Echo Findings: No paravalvular leak Normal left ventricular systolic function with mean transvalvular gradient 11 mmHg  BRIEF CLINICAL NOTE AND INDICATIONS FOR SURGERY   66 y.o. female who presents for TAVR after undergoing multidisciplinary evaluation.  The patient has a history of bicuspid aortic valve disease treated with aortic valve replacement using a 21 mm [facility] Ease pericardial tissue valve in 2013.  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  The patient has developed severe prosthetic aortic stenosis with a mean transvalvular gradient of 61 mmHg and peak gradient of 104 mmHg.  She has had hyperdynamic LV function with LVEF 70 to 75%.  TEE confirmed severe prosthetic aortic valve stenosis with only trivial aortic insufficiency.  Cardiac catheterization showed patent coronary arteries with minimal irregularity and normal right heart pressures.  The patient was evaluated by Dr. 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  She is therefore referred for valve in valve TAVR.  The patient's symptoms include exertional dyspnea and lightheadedness  which have been slowly progressive and currently are New York Heart Association functional class II.  She denies orthopnea, PND, or chest pain.  During the course of the patient's preoperative work up they have been evaluated comprehensively by a multidisciplinary team of specialists coordinated through the [facility] in the [facility].  They have been demonstrated to suffer from symptomatic severe aortic stenosis as noted above. The patient has been counseled extensively as to the relative risks and benefits of all options for the treatment of severe aortic stenosis including long term medical therapy, conventional surgery for aortic valve replacement, and transcatheter aortic valve replacement.  The patient has been independently evaluated in formal cardiac surgical consultation by Dr Cyndia Bent, who deemed the patient appropriate for TAVR. Based upon review of all of the patient's preoperative diagnostic tests they are felt to be candidate for transcatheter aortic valve replacement using the transfemoral approach as an alternative to conventional surgery.    Following the decision to proceed with transcatheter aortic valve replacement, a discussion has been held regarding what types of management strategies would be attempted intraoperatively in the event of life-threatening complications, including whether or not the patient would be considered a candidate for the use of cardiopulmonary bypass and/or conversion to open sternotomy for attempted surgical intervention.  The patient has been advised of a variety of complications that might develop peculiar to this approach including but not limited to risks of death, stroke, paravalvular leak, aortic dissection or other major vascular complications, aortic annulus rupture, device embolization, cardiac rupture or perforation, acute myocardial infarction, arrhythmia, heart block or bradycardia requiring permanent  pacemaker placement, congestive heart failure, respiratory failure, renal failure, pneumonia, infection, other late  complications related to structural valve deterioration or migration, or other complications that might ultimately cause a temporary or permanent loss of functional independence or other long term morbidity.  The patient provides full informed consent for the procedure as described and all questions were answered preoperatively.  DETAILS OF THE OPERATIVE PROCEDURE  PREPARATION:   The patient is brought to the operating room on the above mentioned date and central monitoring was established by the anesthesia team including placement of a radial arterial line. The patient is placed in the supine position on the operating table.  Intravenous antibiotics are administered. The patient is monitored closely throughout the procedure under conscious sedation.  Baseline transthoracic echocardiogram is performed. The patient's chest, abdomen, both groins, and both lower extremities are prepared and draped in a sterile manner. A time out procedure is performed.   PERIPHERAL ACCESS:   Using ultrasound guidance, femoral arterial and venous access is obtained with placement of 6 Fr sheaths on the left side.  [place] images are captured and digitally stored in the patient's record. A pigtail diagnostic catheter was passed through the femoral arterial sheath under fluoroscopic guidance into the aortic root (non-coronary cusp).  A temporary transvenous pacemaker catheter was passed through the femoral venous sheath under fluoroscopic guidance into the right ventricle.  The pacemaker was tested to ensure stable lead placement and pacemaker capture.   TRANSFEMORAL ACCESS:  A micropuncture technique is used to access the right femoral artery under fluoroscopic and ultrasound guidance.  [place] images are captured and digitally stored in the patient's chart. 2 Perclose devices are deployed at 10' and 2' positions to  'PreClose' the femoral artery. An 8 French sheath is placed and then an Amplatz Superstiff wire is advanced through the sheath. This is changed out for an 18 Fr Dry-Seal sheath after progressively dilating over the Superstiff wire.  An AL-2 catheter was used to direct a straight-tip exchange length wire across the native aortic valve into the left ventricle.  It was very difficult to cross the severely stenosed bioprosthetic aortic valve.  Multiple catheters were attempted and ultimately an AL-2 catheter is successful at directing a straight tip wire across the valve.  Wire position is lost on 1 occasion and requires another crossing of the bioprosthesis.  The straight tip wire is exchanged out for a pigtail catheter and position was confirmed in the LV apex.  Simultaneous LV and Ao pressures were recorded.  The pigtail catheter was exchanged for a Confida wire in the LV apex.    BALLOON AORTIC VALVULOPLASTY:  Performed with a 20 mm true balloon.  Hemodynamic recovery is very slow with persistent hypotension necessitating rapid deployment of the transcatheter heart valve.  TRANSCATHETER HEART VALVE DEPLOYMENT:  A Medtronic Evolut Pro Fx transcatheter heart valve (size 23 mm) was prepared and crimped per manufacturer's guidelines, and the proper orientation of the valve is confirmed on the Medtronic delivery system. The valve was advanced through the introducer sheath and across the aortic arch over the Confida wire until it is positioned at the base of the pigtail catheter in the aortic valve annulus. Using the fine tuning wheel, valve deployment begins until annular contact is made. Controlled ventricular pacing is performed. Once proper position is confirmed via aortic root angiograms in the cusp overlap and LAO projections, the valve is deployed to 80% where it is fully functional. The patient's hemodynamic recovery following valve deployment is good.  Final position is confirmed and the valve is slowly  deployed over 30  seconds until both paddles are released. The delivery catheter is removed and the valve is assessed with echocardiography.  Post deployment mean gradient is 16 mmHg.  We felt this would likely be significantly higher once the patient is not under anesthesia.  We elected to post dilate the valve with a 22 mm true balloon.  After prepping the balloon it is advanced into the ventricular position and dilated to 14 atm pressure, further expanding the valve but not fracturing the sewing ring.  Postprocedural echo demonstrated reduction in mean gradient to 11 mmHg.  Echo demostrated acceptable post-procedural gradients, stable mitral valve function, and no aortic insufficiency.  The patient's hemodynamic recovery is slow but ultimately recovers well with a stable heart rhythm.  Echo demonstrates no evidence of pericardial effusion.   PROCEDURE COMPLETION:  The sheath was removed and femoral artery closure is performed using the 2 previously deployed Perclose devices.  Protamine is administered once femoral arterial repair was complete. The site is clear with no evidence of bleeding or hematoma after the sutures are tightened. The temporary pacemaker and pigtail catheters are removed. Mynx closure is used for contralateral femoral arterial hemostasis for the 6 Fr sheath.  The patient tolerated the procedure well and is transported to the recovery area in stable condition. There were no immediate intraoperative complications. All sponge instrument and needle counts are verified correct at completion of the operation.   The patient received a total of 0 mL of intravenous contrast during the procedure.   Sherren Mocha, MD 12/01/2021 5:15 PM

## 2021-12-01 NOTE — Progress Notes (Signed)
Dr. Excell Seltzer at bedside. 0.9NS 500cc fluid bolus started. Increased Levophed to . Per order.

## 2021-12-01 NOTE — Interval H&P Note (Signed)
History and Physical Interval Note:  12/01/2021 9:38 AM  Courtney Harvey  has presented today for surgery, with the diagnosis of Severe Bioprosthetic Aortic Stenosis.  The various methods of treatment have been discussed with the patient and family. After consideration of risks, benefits and other options for treatment, the patient has consented to  Procedure(s): TRANSCATHETER AORTIC VALVE REPLACEMENT, TRANSFEMORAL (N/A) INTRAOPERATIVE TRANSTHORACIC ECHOCARDIOGRAM (N/A) as a surgical intervention.  The patient's history has been reviewed, patient examined, no change in status, stable for surgery.  I have reviewed the patient's chart and labs.  Questions were answered to the patient's satisfaction.     Alleen Borne

## 2021-12-01 NOTE — Transfer of Care (Signed)
Immediate Anesthesia Transfer of Care Note  Patient: Courtney Harvey  Procedure(s) Performed: TRANSCATHETER AORTIC VALVE REPLACEMENT, TRANSFEMORAL INTRAOPERATIVE TRANSTHORACIC ECHOCARDIOGRAM  Patient Location: Cath Lab  Anesthesia Type:MAC  Level of Consciousness: drowsy  Airway & Oxygen Therapy: Patient Spontanous Breathing and Patient connected to face mask oxygen  Post-op Assessment: Report given to RN and Post -op Vital signs reviewed and stable  Post vital signs: Reviewed and stable. Remains on levophed gtt at 5 mcg/min.  Last Vitals:  Vitals Value Taken Time  BP 142/71   Temp    Pulse 48   Resp    SpO2 97     Last Pain:  Vitals:   12/01/21 0708  TempSrc:   PainSc: 0-No pain      Patients Stated Pain Goal: 0 (63/81/77 1165)  Complications: No notable events documented.

## 2021-12-01 NOTE — Progress Notes (Signed)
Purewick placed, tolerated well, safety maintained ° °

## 2021-12-02 ENCOUNTER — Encounter (HOSPITAL_COMMUNITY): Payer: Self-pay | Admitting: Cardiovascular Disease

## 2021-12-02 ENCOUNTER — Encounter: Payer: Self-pay | Admitting: Cardiology

## 2021-12-02 ENCOUNTER — Inpatient Hospital Stay (HOSPITAL_COMMUNITY): Payer: Medicare Other

## 2021-12-02 DIAGNOSIS — Z952 Presence of prosthetic heart valve: Secondary | ICD-10-CM

## 2021-12-02 DIAGNOSIS — I350 Nonrheumatic aortic (valve) stenosis: Secondary | ICD-10-CM

## 2021-12-02 DIAGNOSIS — I959 Hypotension, unspecified: Secondary | ICD-10-CM

## 2021-12-02 LAB — ECHOCARDIOGRAM COMPLETE
AR max vel: 1.63 cm2
AV Area VTI: 1.72 cm2
AV Area mean vel: 1.64 cm2
AV Mean grad: 28 mmHg
AV Peak grad: 45.1 mmHg
Ao pk vel: 3.36 m/s
Area-P 1/2: 3.08 cm2
Calc EF: 73.1 %
Height: 66.5 in
S' Lateral: 2.2 cm
Single Plane A2C EF: 68.8 %
Single Plane A4C EF: 76.9 %
Weight: 2853.63 oz

## 2021-12-02 LAB — BASIC METABOLIC PANEL
Anion gap: 8 (ref 5–15)
BUN: 17 mg/dL (ref 8–23)
CO2: 23 mmol/L (ref 22–32)
Calcium: 8.9 mg/dL (ref 8.9–10.3)
Chloride: 106 mmol/L (ref 98–111)
Creatinine, Ser: 0.92 mg/dL (ref 0.44–1.00)
GFR, Estimated: >60 mL/min (ref >60)
Glucose, Bld: 129 mg/dL — ABNORMAL HIGH (ref 70–99)
Potassium: 3.6 mmol/L (ref 3.5–5.1)
Sodium: 137 mmol/L (ref 135–145)

## 2021-12-02 LAB — CBC
HCT: 33.2 % — ABNORMAL LOW (ref 36.0–46.0)
Hemoglobin: 11.7 g/dL — ABNORMAL LOW (ref 12.0–15.0)
MCH: 30.9 pg (ref 26.0–34.0)
MCHC: 35.2 g/dL (ref 30.0–36.0)
MCV: 87.6 fL (ref 80.0–100.0)
Platelets: 158 10*3/uL (ref 150–400)
RBC: 3.79 MIL/uL — ABNORMAL LOW (ref 3.87–5.11)
RDW: 12.5 % (ref 11.5–15.5)
WBC: 8.8 10*3/uL (ref 4.0–10.5)
nRBC: 0 % (ref 0.0–0.2)

## 2021-12-02 LAB — GLUCOSE, CAPILLARY
Glucose-Capillary: 121 mg/dL — ABNORMAL HIGH (ref 70–99)
Glucose-Capillary: 152 mg/dL — ABNORMAL HIGH (ref 70–99)

## 2021-12-02 MED ORDER — POTASSIUM CHLORIDE CRYS ER 20 MEQ PO TBCR
20.0000 meq | EXTENDED_RELEASE_TABLET | Freq: Once | ORAL | Status: AC
  Administered 2021-12-02: 20 meq via ORAL
  Filled 2021-12-02: qty 1

## 2021-12-02 MED ORDER — CLOPIDOGREL BISULFATE 75 MG PO TABS: 75.0000 mg | ORAL_TABLET | Freq: Every day | ORAL | 6 refills | Status: DC

## 2021-12-02 MED ORDER — CLOPIDOGREL BISULFATE 75 MG PO TABS
75.0000 mg | ORAL_TABLET | Freq: Every day | ORAL | Status: DC
  Administered 2021-12-02: 75 mg via ORAL
  Filled 2021-12-02: qty 1

## 2021-12-02 NOTE — Plan of Care (Signed)

## 2021-12-02 NOTE — Progress Notes (Signed)
°  Echocardiogram 2D Echocardiogram has been performed.  Courtney Harvey 12/02/2021, 3:03 PM

## 2021-12-02 NOTE — Progress Notes (Signed)
CARDIAC REHAB PHASE I   Pt ambulated with RN earlier. Discussed restrictions, exercise, and CRPII. Pt voiced understanding. Will refer to [facility] CRPII.  [id number]  Harriet Masson CES, ACSM 12/02/2021 3:08 PM

## 2021-12-02 NOTE — Discharge Summary (Addendum)
[facility] VALVE TEAM  Discharge Summary    Patient ID: DENAY LUTTERMAN MRN: [id number]; DOB: Feb 07, 1956  Admit date: 12/01/2021 Discharge date: 12/02/2021  Primary Care Provider: Leeanne Rio, MD  Primary Cardiologist: Rozann Lesches, MD / Dr. Burt Knack, MD/ Dr. Cyndia Bent, MD (TAVR)  Discharge Diagnoses    Principal Problem:   Valve in valve  Active Problems:   HTN (hypertension)   S/P aortic valve replacement   S/P patent foramen ovale closure   Aortic insufficiency with aortic stenosis   Severe aortic stenosis   DM2 (diabetes mellitus, type 2) (HCC)   Thoracic aortic aneurysm   Hypotension  Allergies Allergies  Allergen Reactions   Peanut-Containing Drug Products Other (See Comments)    headache   Wellbutrin [Bupropion Hcl] Itching   Diagnostic Studies/Procedures      TAVR OPERATIVE NOTE     Date of Procedure:                12/01/2021    Preoperative Diagnosis:      Severe Aortic Stenosis    Postoperative Diagnosis:    Same    Procedure:        Transcatheter Aortic Valve Replacement (Valve-In-Valve) - Percutaneous  Transfemoral Approach             Medtronic Evolut Pro-Plus (size 23 mm, serial # [id number])              Co-Surgeons:                        Gaye Pollack, MD and Sherren Mocha, MD   Anesthesiologist:                  Adele Barthel, MD   Echocardiographer:              Jenkins Rouge, MD   Pre-operative Echo Findings: Severe bioprosthetic aortic stenosis Normal left ventricular systolic function   Post-operative Echo Findings: No paravalvular leak Normal left ventricular systolic function with mean transvalvular gradient 11 mmHg _____________   Echo 12/02/21: Completed but pending formal read at the time of discharge   History of Present Illness     KAMERA KUNDRAT is a 66 y.o. female with a history of Jehovahs Witness, HTN, DMT2, PFO closure/AVR with a 21 mm Magna Ease pericardial tissue valve  (CHO 2013), TAA (4.6cm) and severe bioprosthetic AS who presented to [facility] on 12/01/21 for planned Valve in Valve TAVR.   She presented to see Dr. Cyndia Bent 11/20/21 after being referred for progressive dyspnea with exertion, chest discomfort, and exertional fatigue. Echocardiogram from 09/11/2021 showed a mean gradient of 61 mmHg with a peak gradient of 104.2 mmHg and a valve area by VTI of 0.83 cm. Left ventricular ejection fraction was 70 to 75%. A nuclear stress test on 09/23/2021 showed findings consistent with prior inferior MI with moderate peri-infarct ischemia. Ejection fraction was 69% on that study. This was felt to be a low to intermediate risk study. A TEE was done on 09/30/2021 which showed severe prosthetic aortic valve stenosis. Valve area by planimetry was 0.63 cm with a mean gradient of 55 mmHg. Peak gradient was 95 mmHg. Dimensionless index of 0.2. Aortic regurgitation was trivial. There was severe thickening and calcification of the leaflets along the commissures with severely restricted leaflet motion. Ejection fraction was 60 to 65%. Cardiac catheterization on 10/02/2021 showed patent coronary arteries with minimal luminal irregularities. Right heart pressures  were normal. She was not felt to be a surgical candidate and therefore the plan was to pursue Valve in Valve TAVR.    Hospital Course   Severe AS: s/p successful TAVR with a 23 mm Medtronic Evolut Pro-Plus via the TF approach on 12/01/21. Post operative echo pending. After TAVR, she continued to require very low dose Levophed with SBP in the 70-80's. ECG and telemetry showed SB in the 40's but no high grade block or LBBB/RBBB. She was transferred to [facility] overnight and was able to come off all pressors. BP has remained soft however she is asymptomatic. Will plan to hold any antihypertensives at least until follow up next week. Medication plan: DAPT with ASA 81 and Plavix 75 given VIV TAVR. She has ambulated with no concerns. SBE to be RX'ed  at follow up.   HTN: 100/68>>97/67>>95/69. Hold Maxide 25/37.5mg  until follow up next week given soft BPs.   DM2: Restart Metformin tomorrow. Follow with PCP  Hx of AVR with a 21 mm Magna Ease with PFO closure: Per Dr. Roxy Manns in 2013 found to have severe, symptomatic aortic valve stenosis on follow up TEE showing 60mm [facility] Ease pericardial tissue valve with severe thickening and  calcification of the bioprosthetic leaflet tips along the commissures with severely restricted leaflet  Motion with severe bioprosthetic aortic valve stenosis. AVA by 2D planimetry is 0.63cm2 and 0.71cm2 by continuity, mean gradient 81mmHg, peak gradient 70mmHg, Vmax 4.53m/s, DI 0.2.   TAA: Chest CTA with a 6 cm fusiform ascending aortic aneurysm, noted to be 3.7 cm on gated cardiac CT done in March 12/2011. Will follow with surveillance imaging.   Consultants: None    The patient was seen and examined by Dr. Burt Knack who feels that she is stable and ready for discharge today, 12/02/21.  _____________  Discharge Vitals Blood pressure 125/73, pulse 61, temperature (!) 97.4 F (36.3 C), temperature source Oral, resp. rate 19, height 5' 6.5" (1.689 m), weight 80.9 kg, SpO2 98 %.  Filed Weights   12/01/21 0700 12/02/21 0500  Weight: 78.8 kg 80.9 kg   General: Well developed, well nourished, NAD Neck: Negative for carotid bruits. No JVD Lungs:Clear to ausculation bilaterally. Breathing is unlabored. Cardiovascular: RRR with S1 S2. Soft flow murmur Extremities: No edema. Groin sites stable  Neuro: Alert and oriented. No focal deficits. No facial asymmetry. MAE spontaneously. Psych: Responds to questions appropriately with normal affect.    Labs & Radiologic Studies    CBC Recent Labs    12/01/21 1238 12/02/21 0451  WBC  --  8.8  HGB 12.6 11.7*  HCT 37.0 33.2*  MCV  --  87.6  PLT  --  0000000   Basic Metabolic Panel Recent Labs    12/01/21 1238 12/02/21 0451  NA 142 137  K 3.8 3.6  CL 105 106  CO2   --  23  GLUCOSE 160* 129*  BUN 20 17  CREATININE 0.90 0.92  CALCIUM  --  8.9   Liver Function Tests No results for input(s): AST, ALT, ALKPHOS, BILITOT, PROT, ALBUMIN in the last 72 hours. No results for input(s): LIPASE, AMYLASE in the last 72 hours. Cardiac Enzymes No results for input(s): CKTOTAL, CKMB, CKMBINDEX, TROPONINI in the last 72 hours. BNP Invalid input(s): POCBNP D-Dimer No results for input(s): DDIMER in the last 72 hours. Hemoglobin A1C No results for input(s): HGBA1C in the last 72 hours. Fasting Lipid Panel No results for input(s): CHOL, HDL, LDLCALC, TRIG, CHOLHDL, LDLDIRECT in the last  72 hours. Thyroid Function Tests No results for input(s): TSH, T4TOTAL, T3FREE, THYROIDAB in the last 72 hours.  Invalid input(s): FREET3 _____________  DG Chest 2 View  Result Date: 11/27/2021 CLINICAL DATA:  66 year old female with aortic stenosis and pending TAVR EXAM: CHEST - 2 VIEW COMPARISON:  02/28/2012 FINDINGS: Cardiomediastinal silhouette unchanged in size and contour. Surgical changes of prior median sternotomy and aortic valve repair No evidence of central vascular congestion. No interlobular septal thickening. No pneumothorax or pleural effusion. Coarsened interstitial markings, with no confluent airspace disease. No acute displaced fracture. Degenerative changes of the spine. IMPRESSION: Negative for acute cardiopulmonary disease. Surgical changes of median sternotomy and prior aortic valve repair. Electronically Signed   By: Corrie Mckusick D.O.   On: 11/27/2021 11:55   ECHOCARDIOGRAM LIMITED  Result Date: 12/01/2021    ECHOCARDIOGRAM LIMITED REPORT   Patient Name:   ADONAI BRENDA Date of Exam: 12/01/2021 Medical Rec #:  [id number]        Height:       66.5 in Accession #:    [id number]       Weight:       173.7 lb Date of Birth:  Jan 28, 1956        BSA:          1.894 m Patient Age:    28 years         BP:           143/73 mmHg Patient Gender: F                HR:            55 bpm. Exam Location:  Inpatient Procedure: Limited Echo, Cardiac Doppler and Color Doppler Indications:     Aortic stenosis I35.0  History:         Patient has prior history of Echocardiogram examinations. CHF;                  Risk Factors:Diabetes and Hypertension. Surgical PFO closure                  performed on 01/26/2012.                  Aortic Valve: 20mm [facility] Ease pericardial tissue valve                  is present in the aortic position. Procedure Date: 01/26/2012.  Sonographer:     Darlina Sicilian RDCS Referring Phys:  [place] Diagnosing Phys: Jenkins Rouge MD IMPRESSIONS  1. There is a 25mm [employer] Ease pericardial tissue valve present in the aortic position. Procedure Date: 01/26/2012. 55mm CoreValve-Evolut Pro prosthetic (TAVR) present in the aortic position. Procedure Date: 12/01/2021.   2. Left ventricular ejection fraction, by estimation, is 60 to 65%. The left ventricle has normal function. There is moderate left ventricular hypertrophy.  3. The mitral valve is abnormal. Mild mitral valve regurgitation. FINDINGS  Left Ventricle: Left ventricular ejection fraction, by estimation, is 60 to 65%. The left ventricle has normal function. The left ventricular internal cavity size was small. There is moderate left ventricular hypertrophy. Pericardium: There is no evidence of pericardial effusion. Mitral Valve: The mitral valve is abnormal. There is mild thickening of the mitral valve leaflet(s). There is mild calcification of the mitral valve leaflet(s). Mild mitral valve regurgitation. Aortic Valve: Pre TAVR: Degenerative/stenotic 21 mm bioprosthetic Magna Ease valve mean gradient 65 peak 98 mmHg AVA 0.47 cm2 Post  TAVR: 23 Medtronic Evolut valve slightly deep in annulus. Initial mean gradient 28 mmHg Valve post dilated with 22 mm True Balloon. Post dilatation mean gradient 12 peak 23 mmHg AVA 1.9 cm2 no significant central or PVL. The aortic valve has been repaired/replaced.  Aortic valve mean gradient measures 11.5 mmHg. Aortic valve peak gradient measures 22.4 mmHg. Aortic valve area, by VTI measures 1.90 cm. There is a 60mm [employer] Ease pericardial tissue valve present in the aortic position. Procedure Date: 01/26/2012. LEFT VENTRICLE PLAX 2D LVOT diam:     2.10 cm LV SV:         93 LV SV Index:   49 LVOT Area:     3.46 cm  AORTIC VALVE AV Area (Vmax):    1.79 cm AV Area (Vmean):   1.93 cm AV Area (VTI):     1.90 cm AV Vmax:           236.50 cm/s AV Vmean:          156.500 cm/s AV VTI:            0.492 m AV Peak Grad:      22.4 mmHg AV Mean Grad:      11.5 mmHg LVOT Vmax:         122.00 cm/s LVOT Vmean:        87.400 cm/s LVOT VTI:          0.269 m LVOT/AV VTI ratio: 0.55  SHUNTS Systemic VTI:  0.27 m Systemic Diam: 2.10 cm Jenkins Rouge MD Electronically signed by Jenkins Rouge MD Signature Date/Time: 12/01/2021/12:13:34 PM    Final    Structural Heart Procedure  Result Date: 12/01/2021 See surgical note for result.   Disposition   Pt is being discharged home today in good condition.  Follow-up Plans & Appointments    Follow-up Information     Eileen Stanford, PA-C Follow up on 12/09/2021.   Specialties: Cardiology, Radiology Why: at 3:30pm. Please arrive at 3:15pm Contact information: [address] [place] [id number] [phone]                Discharge Instructions     Call MD for:  difficulty breathing, headache or visual disturbances   Complete by: As directed    Call MD for:  difficulty breathing, headache or visual disturbances   Complete by: As directed    Call MD for:  extreme fatigue   Complete by: As directed    Call MD for:  extreme fatigue   Complete by: As directed    Call MD for:  hives   Complete by: As directed    Call MD for:  hives   Complete by: As directed    Call MD for:  persistant dizziness or light-headedness   Complete by: As directed    Call MD for:  persistant dizziness or  light-headedness   Complete by: As directed    Call MD for:  persistant nausea and vomiting   Complete by: As directed    Call MD for:  persistant nausea and vomiting   Complete by: As directed    Call MD for:  redness, tenderness, or signs of infection (pain, swelling, redness, odor or green/yellow discharge around incision site)   Complete by: As directed    Call MD for:  redness, tenderness, or signs of infection (pain, swelling, redness, odor or green/yellow discharge around incision site)   Complete by: As directed    Call MD  for:  severe uncontrolled pain   Complete by: As directed    Call MD for:  severe uncontrolled pain   Complete by: As directed    Call MD for:  temperature >100.4   Complete by: As directed    Call MD for:  temperature >100.4   Complete by: As directed    Diet - low sodium heart healthy   Complete by: As directed    Diet - low sodium heart healthy   Complete by: As directed    Discharge instructions   Complete by: As directed    Please hold Metformin until tomorrow, 12/03/21  ACTIVITY AND EXERCISE  Daily activity and exercise are an important part of your recovery. People recover at different rates depending on their general health and type of valve procedure.  Most people recovering from TAVR feel better relatively quickly   No lifting, pushing, pulling more than 10 pounds (examples to avoid: groceries, vacuuming, gardening, golfing):             - For one week with a procedure through the groin.             - For six weeks for procedures through the chest wall or neck. NOTE: You will typically see one of our providers 7-14 days after your procedure to discuss [place] the above activities.      DRIVING  Do not drive until you are seen for follow up and cleared by a provider. Generally, we ask patient to not drive for 1 week after their procedure.  If you have been told by your doctor in the past that you may not drive, you must talk with  him/her before you begin driving again.   DRESSING  Groin site: you may leave the clear dressing over the site for up to one week or until it falls off.   HYGIENE  If you had a femoral (leg) procedure, you may take a shower when you return home. After the shower, pat the site dry. Do NOT use powder, oils or lotions in your groin area until the site has completely healed.  If you had a chest procedure, you may shower when you return home unless specifically instructed not to by your discharging practitioner.             - DO NOT scrub incision; pat dry with a towel.             - DO NOT apply any lotions, oils, powders to the incision.             - No tub baths / swimming for at least 2 weeks.  If you notice any fevers, chills, increased pain, swelling, bleeding or pus, please contact your doctor.   ADDITIONAL INFORMATION  If you are going to have an upcoming dental procedure, please contact our office as you will require antibiotics ahead of time to prevent infection on your heart valve.    If you have any questions or concerns you can call the structural heart phone during normal business hours 8am-4pm. If you have an urgent need after hours or weekends please call [phone] to talk to the on call provider for general cardiology. If you have an emergency that requires immediate attention, please call 911.    After TAVR Checklist  Check  Test Description  Follow up appointment in 1-2 weeks  You will see our structural heart advanced practice provider. Your incision sites will be checked and you  will be cleared to drive and resume all normal activities if you are doing well.    1 month echo and follow up  You will have an echo to check on your new heart valve and be seen back in the office by a structural heart advanced practice provider.  Follow up with your primary cardiologist You will need to be seen by your primary cardiologist in the following 3-6 months after your 1 month  appointment in the valve clinic.   1 year echo and follow up You will have another echo to check on your heart valve after 1 year and be seen back in the office by a structural heart advanced practice provider. This your last structural heart visit.  Bacterial endocarditis prophylaxis  You will have to take antibiotics for the rest of your life before all dental procedures (even teeth cleanings) to protect your heart valve. Antibiotics are also required before some surgeries. Please check with your cardiologist before scheduling any surgeries. Also, please make sure to tell us if you have a penicillin allergy as you will require an alternative antibiotic.   Discharge instructions   Complete by: As directed    Please continue to hold your Metformin until tomorrow, 12/03/21  ACTIVITY AND EXERCISE  Daily activity and exercise are an important part of your recovery. People recover at different rates depending on their general health and type of valve procedure.  Most people recovering from TAVR feel better relatively quickly   No lifting, pushing, pulling more than 10 pounds (examples to avoid: groceries, vacuuming, gardening, golfing):             - For one week with a procedure through the groin.             - For six weeks for procedures through the chest wall or neck. NOTE: You will typically see one of our providers 7-14 days after your procedure to discuss [place] the above activities.      DRIVING  Do not drive until you are seen for follow up and cleared by a provider. Generally, we ask patient to not drive for 1 week after their procedure.  If you have been told by your doctor in the past that you may not drive, you must talk with him/her before you begin driving again.   DRESSING  Groin site: you may leave the clear dressing over the site for up to one week or until it falls off.   HYGIENE  If you had a femoral (leg) procedure, you may take a shower when you return home.  After the shower, pat the site dry. Do NOT use powder, oils or lotions in your groin area until the site has completely healed.  If you had a chest procedure, you may shower when you return home unless specifically instructed not to by your discharging practitioner.             - DO NOT scrub incision; pat dry with a towel.             - DO NOT apply any lotions, oils, powders to the incision.             - No tub baths / swimming for at least 2 weeks.  If you notice any fevers, chills, increased pain, swelling, bleeding or pus, please contact your doctor.   ADDITIONAL INFORMATION  If you are going to have an upcoming dental procedure, please contact our office as you will  require antibiotics ahead of time to prevent infection on your heart valve.    If you have any questions or concerns you can call the structural heart phone during normal business hours 8am-4pm. If you have an urgent need after hours or weekends please call [phone] to talk to the on call provider for general cardiology. If you have an emergency that requires immediate attention, please call 911.    After TAVR Checklist  Check  Test Description  Follow up appointment in 1-2 weeks  You will see our structural heart advanced practice provider. Your incision sites will be checked and you will be cleared to drive and resume all normal activities if you are doing well.    1 month echo and follow up  You will have an echo to check on your new heart valve and be seen back in the office by a structural heart advanced practice provider.  Follow up with your primary cardiologist You will need to be seen by your primary cardiologist in the following 3-6 months after your 1 month appointment in the valve clinic.   1 year echo and follow up You will have another echo to check on your heart valve after 1 year and be seen back in the office by a structural heart advanced practice provider. This your last structural heart  visit.  Bacterial endocarditis prophylaxis  You will have to take antibiotics for the rest of your life before all dental procedures (even teeth cleanings) to protect your heart valve. Antibiotics are also required before some surgeries. Please check with your cardiologist before scheduling any surgeries. Also, please make sure to tell us if you have a penicillin allergy as you will require an alternative antibiotic.   If the dressing is still on your incision site when you go home, remove it on the third day after your surgery date. Remove dressing if it begins to fall off, or if it is dirty or damaged before the third day.   Complete by: As directed    If the dressing is still on your incision site when you go home, remove it on the third day after your surgery date. Remove dressing if it begins to fall off, or if it is dirty or damaged before the third day.   Complete by: As directed    Increase activity slowly   Complete by: As directed    Increase activity slowly   Complete by: As directed       Discharge Medications   Allergies as of 12/02/2021       Reactions   Peanut-containing Drug Products Other (See Comments)   headache   Wellbutrin [bupropion Hcl] Itching        Medication List     STOP taking these medications    triamterene-hydrochlorothiazide 37.5-25 MG tablet Commonly known as: MAXZIDE-25       TAKE these medications    acetaminophen 500 MG tablet Commonly known as: TYLENOL Take 500-1,000 mg by mouth every 6 (six) hours as needed for moderate pain.   aspirin EC 81 MG tablet Take 81 mg by mouth every 6 (six) hours as needed (headache/pain.). Swallow whole.   cetirizine 10 MG tablet Commonly known as: ZYRTEC Take 10 mg by mouth in the morning.   clopidogrel 75 MG tablet Commonly known as: PLAVIX Take 1 tablet (75 mg total) by mouth daily. Start taking on: December 03, 2021   fluticasone 50 MCG/ACT nasal spray Commonly known as: FLONASE Place 1 spray  into  both nostrils daily as needed for allergies or rhinitis.   metFORMIN 500 MG 24 hr tablet Commonly known as: GLUCOPHAGE-XR Take 500 mg by mouth daily before breakfast.   nicotine polacrilex 2 MG gum Commonly known as: NICORETTE Take 2 mg by mouth as needed for smoking cessation.               Discharge Care Instructions  (From admission, onward)           Start     Ordered   12/02/21 0000  If the dressing is still on your incision site when you go home, remove it on the third day after your surgery date. Remove dressing if it begins to fall off, or if it is dirty or damaged before the third day.        12/02/21 1418   12/02/21 0000  If the dressing is still on your incision site when you go home, remove it on the third day after your surgery date. Remove dressing if it begins to fall off, or if it is dirty or damaged before the third day.        12/02/21 1418             Outstanding Labs/Studies   None   Duration of Discharge Encounter   Greater than 30 minutes including physician time.  SignedKathyrn Drown, NP 12/02/2021, 2:18 PM [phone]   Patient seen, examined. Available data reviewed. Agree with findings, assessment, and plan as outlined by Kathyrn Drown, NP.  The patient is independently interviewed and examined.  She is alert, oriented, no distress.  JVP is normal, lungs are clear to auscultation bilaterally, heart is regular rate and rhythm with a 2/6 systolic ejection murmur at the right upper sternal border, abdomen soft nontender, bilateral groin sites are clear with no hematoma or ecchymosis, extremities have no edema.  Telemetry reveals normal sinus rhythm with no high-grade AV block and no bradycardic events.  The patient is doing very well status post valve in valve TAVR, now postoperative day #1.  Telemetry is reviewed as above.  Labs are reviewed and are essentially within normal limits.  She is medically stable for discharge today after she  weaned off of low-dose Levophed overnight.  She is asymptomatic with normal blood pressure and we will hold her antihypertensive medications at discharge.  Follow-up instructions and post TAVR instructions reviewed with the patient today.  We will review her echocardiogram when it is completed.  Sherren Mocha, M.D. 12/02/2021 2:22 PM

## 2021-12-02 NOTE — Op Note (Signed)
[facility]   MULTIDISCIPLINARY HEART VALVE TEAM     TAVR OPERATIVE NOTE    Courtney Harvey [id number]  Date of Procedure:                 12/01/2021   Preoperative Diagnosis:      Severe Prosthetic Aortic Valve Stenosis    Postoperative Diagnosis:    Same    Procedure:        Transcatheter Aortic Valve in Valve Replacement - Percutaneous Right Transfemoral Approach             Medtronic Evolut FX  (size 23 mm, model # [id number], serial # [id number])              Co-Surgeons:            Gaye Pollack, MD and Sherren Mocha, MD     Anesthesiologist:                  Perfecto Kingdom, MD   Echocardiographer:              Edmonia James, MD   Pre-operative Echo Findings: Severe aortic stenosis and moderate AI  Normal left ventricular systolic function   Post-operative Echo Findings: No paravalvular leak Normal left ventricular systolic function      BRIEF CLINICAL NOTE AND INDICATIONS FOR SURGERY    This 66 year old woman has stage D, severe, symptomatic bioprosthetic aortic valve stenosis with New York Heart Association class II symptoms of exertional fatigue and shortness of breath consistent with chronic diastolic congestive heart failure. I have personally reviewed her TEE, cardiac catheterization, and CTA studies. TEE from 09/30/2021 shows a 21 mm pericardial valve with severe thickening and calcification of the leaflets and severely restricted motion. The aortic valve area by 2D planimetry is 0.63 cm and 0.71 cm by continuity. The mean gradient is 55 mmHg with a peak gradient of 95 mmHg. Dimensionless index is 0.2. There is mild mitral valve regurgitation. Left ventricular ejection fraction is 60 to 65%. Cardiac catheterization shows no coronary disease. CTA of her chest shows a 4.6 cm fusiform ascending aortic aneurysm. This was noted to be 3.7 cm on gated cardiac CT done in March 2013 prior to her initial aortic valve replacement. She has not had any studies  since then. She did have a bicuspid aortic valve. I agree that redo aortic valve replacement is indicated in this patient for relief of her symptoms and to prevent progressive left ventricular deterioration. Given her relatively young age I think the best long-term option for her would be open surgical redo aortic valve replacement and replacement of her ascending aortic aneurysm. This may improve her options long-term if she developed recurrent prosthetic aortic valve deterioration. It would also decrease the risk of aortic dissection and progressive enlargement of the ascending aortic aneurysm. 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 Therefore I would not consider her a candidate for redo aortic valve replacement and especially replacement of her aortic valve and ascending aneurysm due to the risk of requiring blood products for that type of surgery. I think transcatheter valve in valve aortic valve replacement would be a reasonable option under the circumstances although it may limit her options long-term if that valve deteriorates and she may still have progressive enlargement of her ascending aortic aneurysm. I discussed all this with her and answered her questions.  The patient was counseled at length regarding treatment alternatives  for management of severe symptomatic bioprosthetic aortic stenosis. The risks and benefits of surgical intervention has been discussed in detail. Long-term prognosis with medical therapy was discussed. Alternative approaches such as conventional surgical aortic valve replacement, transcatheter aortic valve replacement, and palliative medical therapy were compared and contrasted at length. This discussion was placed in the context of the patient's own specific clinical presentation and past medical history. All of her questions have been addressed.  Following the decision to proceed with transcatheter aortic valve replacement, a  discussion was held regarding what types of management strategies would be attempted intraoperatively in the event of life-threatening complications, including whether or not the patient would be considered a candidate for the use of cardiopulmonary bypass and/or conversion to open sternotomy for attempted surgical intervention. I would not consider her a candidate for emergent sternotomy to manage any intraoperative complications due to her previous sternotomy for aortic valve replacement and refusal of any blood product transfusion. The patient is aware of the fact that transient use of cardiopulmonary bypass may be necessary. The patient has been advised of a variety of complications that might develop including but not limited to risks of death, stroke, paravalvular leak, aortic dissection or other major vascular complications, aortic annulus rupture, device embolization, cardiac rupture or perforation, mitral regurgitation, acute myocardial infarction, arrhythmia, heart block or bradycardia requiring permanent pacemaker placement, congestive heart failure, respiratory failure, renal failure, pneumonia, infection, other late complications related to structural valve deterioration or migration, or other complications that might ultimately cause a temporary or permanent loss of functional independence or other long term morbidity. The patient provides full informed consent for the procedure as described and all questions were answered.        DETAILS OF THE OPERATIVE PROCEDURE   PREPARATION:     The patient is brought to the operating room on the above mentioned date and central monitoring was established by the anesthesia team including placement of a radial arterial line. The patient is placed in the supine position on the operating table.  Intravenous antibiotics are administered. The procedure was performed under conscious sedation with monitoring by the anesthesia team.    Baseline transthoracic  echocardiogram was performed. The patient's  abdomen and both groins were prepared and draped in a sterile manner. A time out procedure was performed.     PERIPHERAL ACCESS:     Using ultrasound guidance and modified Seldinger technique, femoral arterial and venous access was obtained with placement of 6 Fr sheaths on the left side.  A pigtail diagnostic catheter was passed through the left arterial sheath under fluoroscopic guidance into the aortic root.  A temporary transvenous pacemaker catheter was passed through the left femoral venous sheath under fluoroscopic guidance into the right ventricle.  The pacemaker was tested to ensure stable lead placement and pacemaker capture.      TRANSFEMORAL ACCESS:    Percutaneous transfemoral access and sheath placement was performed using ultrasound guidance.  The right common femoral artery was cannulated using a micropuncture needle.  A pair of Abbott Perclose percutaneous closure devices were placed and a 6 French sheath replaced into the femoral artery.  The patient was heparinized systemically and ACT verified > 250 seconds.     An 18 Fr transfemoral Gore Dry-Seal sheath was introduced into the right femoral artery after progressively dilating over an Amplatz superstiff wire. An AL-2 catheter was used to direct a straight-tip exchange length wire across the prosthetic aortic valve into the left ventricle. This was exchanged  out for a pigtail catheter and position was confirmed in the LV apex. Simultaneous LV and Ao pressures were recorded.  The pigtail catheter was exchanged for a Confida wire in the LV apex.      BALLOON AORTIC VALVULOPLASTY:    Performed using a 20 mm True balloon. Hemodynamic recovery is very slow with persistent hypotension necessitating rapid deployment of the transcatheter heart valve.   TRANSCATHETER HEART VALVE DEPLOYMENT:    A Medtronic Evolut FX transcatheter heart valve (size 23 mm) was prepared and loaded into the  Medtronic delivery catheter system per manufacturer's guidelines and the proper orientation of the valve is confirmed under fluoroscopy. The delivery system and inline sheath were inserted into the right common femoral artery over the Super-stiff wire and the inline sheath advanced into the abdominal aorta under fluoroscopic guidance. The delivery catheter was advanced around the aortic arch and the valve was carefully positioned across the aortic valve annulus and position was checked using cusp overlap and LAO projections.  The valve deployed using the normal technique under fluoroscopic guidance. Intermittent pacing was used during valve deployment. The delivery system and guidewire were retracted into the descending aorta and the nosecone re-sheathed. Valve function is assessed using echocardiography. There is felt to be no paravalvular leak and no central aortic insufficiency.  The mean gradient was 16 mm Hg. We decided to post dilate the valve to try to expand it further to decrease this gradient. A 22 mm True balloon was used to dilate the valve to 14 atm pressure which further expanded the valve but did not fracture the sewing ring of the original valve. The post dilation mean gradient decreased to 11 mm Hg.  The patient's hemodynamic recovery following valve deployment is slow but she recovered completely with stable rhythm. There was no pericardial effusion on echo.        PROCEDURE COMPLETION:    The delivery system and in-line sheath were removed and femoral artery closure performed using the Perclose devices.  Protamine was administered once femoral arterial repair was complete. The temporary pacemaker, pigtail catheter and femoral sheaths were removed with manual pressure used for hemostasis. A Mynx closure device was used for the left femoral artery.    The patient tolerated the procedure well and is transported to the cath lab recovery area in stable condition. There were no immediate  intraoperative complications. All sponge instrument and needle counts are verified correct at completion of the operation.    No blood products were administered during the operation.   The patient received no intravenous contrast during the procedure.     Gaye Pollack, MD  12/01/2021

## 2021-12-02 NOTE — Progress Notes (Signed)
°  Transition of Care [facility]) Screening Note   Patient Details  Name: Courtney Harvey Date of Birth: [birth year]   Transition of Care [facility]) CM/SW Contact:    Delilah Shan, LCSWA Phone Number: 12/02/2021, 2:18 PM    Transition of Care Department [facility]) has reviewed patient and no TOC needs have been identified at this time. We will continue to monitor patient advancement through interdisciplinary progression rounds. If new patient transition needs arise, please place a TOC consult.

## 2021-12-03 ENCOUNTER — Telehealth: Payer: Self-pay | Admitting: Physician Assistant

## 2021-12-03 NOTE — Telephone Encounter (Signed)
  [facility]   MULTIDISCIPLINARY HEART VALVE TEAM   Attempted TOC call.   Remmy Crass PA-C  [facility]   

## 2021-12-04 NOTE — Telephone Encounter (Signed)
[facility]   MULTIDISCIPLINARY HEART VALVE TEAM     Patient contacted regarding discharge from [facility] on 2/1   Patient understands to follow up with a structural heart APP on 2/8 at 3:30PM [facility].  Patient understands discharge instructions? yes Patient understands medications and regimen? yes Patient understands to bring all medications to this visit? yes

## 2021-12-09 ENCOUNTER — Ambulatory Visit: Payer: Medicare Other | Admitting: Physician Assistant

## 2021-12-09 ENCOUNTER — Other Ambulatory Visit: Payer: Self-pay

## 2021-12-09 VITALS — BP 120/80 | HR 65 | Ht 66.5 in | Wt 176.0 lb

## 2021-12-09 DIAGNOSIS — I712 Thoracic aortic aneurysm, without rupture, unspecified: Secondary | ICD-10-CM | POA: Diagnosis not present

## 2021-12-09 DIAGNOSIS — Z952 Presence of prosthetic heart valve: Secondary | ICD-10-CM

## 2021-12-09 DIAGNOSIS — I10 Essential (primary) hypertension: Secondary | ICD-10-CM | POA: Diagnosis not present

## 2021-12-09 MED ORDER — AMOXICILLIN 500 MG PO TABS: ORAL_TABLET | ORAL | 6 refills | Status: AC

## 2021-12-09 NOTE — Progress Notes (Signed)
[facility]   MULTIDISCIPLINARY HEART VALVE CLINIC                                     Cardiology Office Note:    Date:  12/09/2021   ID:  Courtney Harvey, DOB 02/23/1956, MRN [id number]  PCP:  Suzan Slickucker, Alethea Y, MD  [facility] Cardiologist:  Nona DellSamuel McDowell, MD  / Dr. Excell Seltzerooper, MD/ Dr. Laneta SimmersBartle, MD (TAVR) [facility] Electrophysiologist:  None   Referring MD: Suzan Slickucker, Alethea Y, MD   [facility]  History of Present Illness:    Courtney Harvey is a 66 y.o. 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   Echocardiogram from 09/11/2021 showed a mean gradient of 61 mmHg with a peak gradient of 104.2 mmHg and a valve area by VTI of 0.83 cm. Left ventricular ejection fraction was 70 to 75%. TEE 09/30/2021 showed severe prosthetic aortic valve stenosis. Valve area by planimetry was 0.63 cm with a mean gradient of 55 mmHg. Peak gradient was 95 mmHg. Dimensionless index of 0.2. Aortic regurgitation was trivial. There was severe thickening and calcification of the leaflets along the commissures with severely restricted leaflet motion. Ejection fraction was 60 to 65%. Cardiac catheterization on 10/02/2021 showed patent coronary arteries with minimal luminal irregularities. Right heart pressures were normal.  She presented to see Dr. Laneta SimmersBartle 11/20/21 after being referred for progressive dyspnea with exertion, chest discomfort, and exertional fatigue.  She was not felt to be a surgical candidate given Jehovah Witness status.   She underwent successful valve in valve TAVR with a 23 mm Medtronic Evolut Pro-Plus via the TF approach on 12/01/21. Post operative echo showed EF 60-65%, normally functioning TAVR with a mean gradient of 28 mmHg and trivial PVL. She was discharged  on aspirin and plavix x6 months given ViV TAVR. Bp was noted to be soft and home Maxide was held.  Today the patient presents to clinic for follow up. Here alone. No CP or SOB. She reports some intermittent LE edema but none today. No orthopnea or PND. No dizziness or syncope. No blood in stool or urine. No palpitations. She feels 100% better with a marked improvement in energy and breathing.   Past Medical History:  Diagnosis Date   Bicuspid aortic valve 2005   Chronic diastolic heart failure (HCC)    Complication of anesthesia 2020   pt states daughter told her that she was told she "stopped breathing for a couple of seconds" during colonoscopy in 2020 and was hard to wake up but no interventions were done per pt   Essential hypertension    Glaucoma    H/O hiatal hernia    Recurrent upper respiratory infection (URI)    S/P aortic valve replacement 01/26/2012   [facility] Magna Ease pericardial tissue valve   S/P patent foramen ovale closure 01/26/2012   PFO found on TEE prior to AVR. 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 Excell Seltzerooper and Dr. Laneta SimmersBartle   Type 2 diabetes mellitus [facility](HCC)     Past Surgical History:  Procedure Laterality Date   AORTIC VALVE REPLACEMENT  01/26/2012   Procedure: AORTIC VALVE REPLACEMENT (AVR);  Surgeon: Purcell Nails, MD;  Location: [facility] OR;  Service: Open Heart Surgery;  Laterality: N/A;   CARDIAC CATHETERIZATION  2005   Friday March 8th       pcp Dr tapper  in [place]        HYSTEROSCOPY WITH D & C N/A 09/06/2018   Procedure: DILATATION AND CURETTAGE /HYSTEROSCOPY  ENDOMETRIAL POLYPECTOMY;  Surgeon: Lazaro Arms, MD;  Location: [facility] ORS;  Service: Gynecology;  Laterality: N/A;   INTRAOPERATIVE TRANSTHORACIC ECHOCARDIOGRAM N/A 12/01/2021   Procedure: INTRAOPERATIVE TRANSTHORACIC ECHOCARDIOGRAM;  Surgeon: [name], MD;   Location: [facility] INVASIVE CV LAB;  Service: Open Heart Surgery;  Laterality: N/A;   LEFT AND RIGHT HEART CATHETERIZATION WITH CORONARY ANGIOGRAM N/A 01/07/2012   Procedure: LEFT AND RIGHT HEART CATHETERIZATION WITH CORONARY ANGIOGRAM;  Surgeon: Wendall Stade, MD;  Location: [facility] CATH LAB;  Service: Cardiovascular;  Laterality: N/A;   RIGHT HEART CATH AND CORONARY ANGIOGRAPHY N/A 10/02/2021   Procedure: RIGHT HEART CATH AND CORONARY ANGIOGRAPHY;  Surgeon: [name], MD;  Location: [facility] INVASIVE CV LAB;  Service: Cardiovascular;  Laterality: N/A;   TEE WITHOUT CARDIOVERSION  01/10/2012   Procedure: TRANSESOPHAGEAL ECHOCARDIOGRAM (TEE);  Surgeon: Pricilla Riffle, MD;  Location: [facility] ENDOSCOPY;  Service: Cardiovascular;  Laterality: N/A;  TEE call Trish in am for time   TEE WITHOUT CARDIOVERSION N/A 09/30/2021   Procedure: TRANSESOPHAGEAL ECHOCARDIOGRAM (TEE) WITH 3D IMAGING;  Surgeon: [name], MD;  Location: [facility] ENDOSCOPY;  Service: Cardiovascular;  Laterality: N/A;   TRANSCATHETER AORTIC VALVE REPLACEMENT, TRANSFEMORAL N/A 12/01/2021   Procedure: TRANSCATHETER AORTIC VALVE REPLACEMENT, TRANSFEMORAL;  Surgeon: [name], MD;  Location: [facility] INVASIVE CV LAB;  Service: Open Heart Surgery;  Laterality: N/A;    Current Medications: Current Meds  Medication Sig   acetaminophen (TYLENOL) 500 MG tablet Take 500-1,000 mg by mouth every 6 (six) hours as needed for moderate pain.   amoxicillin (AMOXIL) 500 MG tablet Take 4 tablets by mouth 1 hour prior to dental procedures and cleanings   aspirin EC 81 MG tablet Take 81 mg by mouth every 6 (six) hours as needed (headache/pain.). Swallow whole.   cetirizine (ZYRTEC) 10 MG tablet Take 10 mg by mouth in the morning.   clopidogrel (PLAVIX) 75 MG tablet Take 1 tablet (75 mg total) by mouth daily.   fluticasone (FLONASE) 50 MCG/ACT nasal spray Place 1 spray into both nostrils daily as needed for allergies or rhinitis.   metFORMIN (GLUCOPHAGE-XR) 500 MG  24 hr tablet Take 500 mg by mouth daily before breakfast.   nicotine polacrilex (NICORETTE) 2 MG gum Take 2 mg by mouth as needed for smoking cessation.   Current Facility-Administered Medications for the 12/09/21 encounter (Office Visit) with [facility]  Medication   sodium chloride flush (NS) 0.9 % injection 3 mL     Allergies:   Peanut-containing drug products and Wellbutrin [bupropion hcl]   Social History   Socioeconomic History   Marital status: Widowed    Spouse name: Not on file   Number of children: 2   Years of education: Not on file   Highest education level: Not on file  Occupational History    Employer: [employer]  Tobacco Use   Smoking status: Former    Packs/day: 1.00    Years: 40.00    Pack years: 40.00    Types: Cigarettes    Quit  date: 01/06/2012    Years since quitting: 9.9   Smokeless tobacco: Never  Vaping Use   Vaping Use: Never used  Substance and Sexual Activity   Alcohol use: No    Alcohol/week: 0.0 standard drinks   Drug use: No   Sexual activity: Not Currently    Birth control/protection: Post-menopausal  Other Topics Concern   Not on file  Social History Narrative   She is from [place], [place]. She is widowed. Her husband committed suicide sometime last year, but she feels that she has dealt with it. She currently lives alone and works at [employer].    Social Determinants of Health   Financial Resource Strain: Not on file  Food Insecurity: Not on file  Transportation Needs: Not on file  Physical Activity: Not on file  Stress: Not on file  Social Connections: Not on file     Family History: The patient's family history includes Cancer in her sister; Hypertension in her brother and son; Lung cancer in her father; Uterine cancer in her mother.  ROS:   Please see the history of present illness.    All other systems reviewed and are negative.  EKGs/Labs/Other Studies Reviewed:    The following studies were  reviewed today:  TAVR OPERATIVE NOTE     Date of Procedure:                12/01/2021    Preoperative Diagnosis:      Severe Aortic Stenosis    Postoperative Diagnosis:    Same    Procedure:        Transcatheter Aortic Valve Replacement (Valve-In-Valve) - Percutaneous  Transfemoral Approach             Medtronic Evolut Pro-Plus (size 23 mm, serial # [id number])              Co-Surgeons:                        Alleen Borne, MD and [name], MD   Anesthesiologist:                  Karna Christmas, MD   Echocardiographer:              Charlton Haws, MD   Pre-operative Echo Findings: Severe bioprosthetic aortic stenosis Normal left ventricular systolic function   Post-operative Echo Findings: No paravalvular leak Normal left ventricular systolic function with mean transvalvular gradient 11 mmHg _____________   Echo 12/02/21: IMPRESSIONS  1. Left ventricular ejection fraction, by estimation, is 60 to 65%. The  left ventricle has normal function. The left ventricle has no regional  wall motion abnormalities. There is mild concentric left ventricular  hypertrophy. Left ventricular diastolic  parameters are consistent with Grade III diastolic dysfunction  (restrictive).   2. Right ventricular systolic function is normal. The right ventricular  size is normal. There is normal pulmonary artery systolic pressure.   3. The mitral valve is abnormal. No evidence of mitral valve  regurgitation. No evidence of mitral stenosis.   4. Well seated bioprosthetic aortic valve. The aortic valve has been  repaired/replaced. Central aortic valve regurgitation is not visualized.  There is very trivial paravalvular leak in the 12oclock position in the  psax view. No aortic stenosis is  present. There is a 23 mm Evolut Pro+ 22% oversized valve present in the  aortic position. Procedure Date: 12/01/21.   5. Aneurysm of the ascending aorta, measuring 40 mm.  6. The inferior vena cava is normal in  size with greater than 50%  respiratory variability, suggesting right atrial pressure of 3 mmHg.   EKG:  EKG is ordered today.  The ekg ordered today demonstrates sinus with nonspecific ST/TW changes.  Recent Labs: 11/27/2021: ALT 25; B Natriuretic Peptide 45.0 12/02/2021: BUN 17; Creatinine, Ser 0.92; Hemoglobin 11.7; Platelets 158; Potassium 3.6; Sodium 137  Recent Lipid Panel No results found for: CHOL, TRIG, HDL, CHOLHDL, VLDL, LDLCALC, LDLDIRECT   Risk Assessment/Calculations:       Physical Exam:    VS:  BP 120/80 (BP Location: Left Arm, Patient Position: Sitting, Cuff Size: Normal)    Pulse 65    Ht 5' 6.5" (1.689 m)    Wt 176 lb (79.8 kg)    LMP  (LMP Unknown)    BMI 27.98 kg/m     Wt Readings from Last 3 Encounters:  12/09/21 176 lb (79.8 kg)  12/02/21 178 lb 5.6 oz (80.9 kg)  11/27/21 173 lb 11.2 oz (78.8 kg)     GEN:  Well nourished, well developed in no acute distress HEENT: Normal NECK: No JVD LYMPHATICS: No lymphadenopathy CARDIAC: RRR, soft flow murmur @ RUSB. No rubs, gallops RESPIRATORY:  Clear to auscultation without rales, wheezing or rhonchi  ABDOMEN: Soft, non-tender, non-distended MUSCULOSKELETAL:  No edema; No deformity  SKIN: Warm and dry.  Groin sites clear without hematoma or ecchymosis  NEUROLOGIC:  Alert and oriented x 3 PSYCHIATRIC:  Normal affect   ASSESSMENT:    1. s/p Valve in valve TAVR   2. Essential hypertension   3. Thoracic aortic aneurysm without rupture, unspecified part    PLAN:    In order of problems listed above:  Severe bioprosthetic valve dysfunction with severe AS s/p ViV TAVR: doing well and reports feeling "100% better" with complete resolution of all her previous symptoms. Groin sites healing well. ECG with no HAVB. SBE prophylaxis discussed; I have RX'd amoxicillin. Continue on DAPT with aspirin and plavix x 3-6 months. I will see her back in March for 1 month follow up.    HTN: BP well controlled off Maxide 12/80 today  but will keep a close eye given TAA. She reported some LE edema which I did not appreciate today but could be related to holding Maxide. Will have a low threshold to add this back. She will watch BP at home.   TAA: pre TAVR CTA showed a 4.6 cm fusiform ascending aortic aneurysm. Will require semi annual follow up .;     Medication Adjustments/Labs and Tests Ordered: Current medicines are reviewed at length with the patient today.  Concerns regarding medicines are outlined above.  Orders Placed This Encounter  Procedures   EKG 12-Lead   Meds ordered this encounter  Medications   amoxicillin (AMOXIL) 500 MG tablet    Sig: Take 4 tablets by mouth 1 hour prior to dental procedures and cleanings    Dispense:  12 tablet    Refill:  6    Patient Instructions  Medication Instructions:  Start Amoxicillin 500 mg, Take 4 tablets by mouth 1 hour prior to dental procedures and cleanings  *If you need a refill on your cardiac medications before your next appointment, please call your pharmacy*   Lab Work: None ordered   If you have labs (blood work) drawn today and your tests are completely normal, you will receive your results only by: MyChart Message (if you have MyChart) OR A paper copy in the mail  If you have any lab test that is abnormal or we need to change your treatment, we will call you to review the results.   Testing/Procedures: None ordered today    Follow-Up: Follow up as scheduled 1}    Other Instructions None     Signed, Cline Crock, PA-C  12/09/2021 3:47 PM    [facility]

## 2021-12-09 NOTE — Patient Instructions (Signed)
Medication Instructions:  Start Amoxicillin 500 mg, Take 4 tablets by mouth 1 hour prior to dental procedures and cleanings  *If you need a refill on your cardiac medications before your next appointment, please call your pharmacy*   Lab Work: None ordered   If you have labs (blood work) drawn today and your tests are completely normal, you will receive your results only by: [facility] (if you have MyChart) OR A paper copy in the mail If you have any lab test that is abnormal or we need to change your treatment, we will call you to review the results.   Testing/Procedures: None ordered today    Follow-Up: Follow up as scheduled 1}    Other Instructions None

## 2021-12-15 ENCOUNTER — Ambulatory Visit: Payer: Medicare Other | Admitting: Cardiology

## 2021-12-29 NOTE — Progress Notes (Addendum)
[facility]   MULTIDISCIPLINARY HEART VALVE CLINIC                                     Cardiology Office Note:    Date:  01/05/2022   ID:  Courtney Siaseggy D Ebersole, DOB 08-24-1956, MRN [id number]  PCP:  Suzan Slickucker, Alethea Y, MD  [facility] Cardiologist:  Nona DellSamuel McDowell, MD /Dr. Excell Seltzerooper, MD/ Dr. Laneta SimmersBartle, MD (TAVR) [facility] Electrophysiologist:  None   Referring MD: Suzan Slickucker, Alethea Y, MD   Chief Complaint  Patient presents with   Follow-up   History of Present Illness:    Courtney Harvey is a 66 y.o. 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    Echocardiogram from 09/11/2021 showed a mean gradient of 61 mmHg with a peak gradient of 104.2 mmHg and a valve area by VTI of 0.83 cm. Left ventricular ejection fraction was 70 to 75%. TEE 09/30/2021 showed severe prosthetic aortic valve stenosis. Valve area by planimetry was 0.63 cm with a mean gradient of 55 mmHg. Peak gradient was 95 mmHg. Dimensionless index of 0.2. Aortic regurgitation was trivial. There was severe thickening and calcification of the leaflets along the commissures with severely restricted leaflet motion. Ejection fraction was 60 to 65%. Cardiac catheterization on 10/02/2021 showed patent coronary arteries with minimal luminal irregularities. Right heart pressures were normal.   She presented to see Dr. Laneta SimmersBartle 11/20/21 after being referred for progressive dyspnea with exertion, chest discomfort, and exertional fatigue.  She was not felt to be a surgical candidate given Jehovah Witness status.    She underwent successful valve in valve TAVR with a 23 mm Medtronic Evolut Pro-Plus via the TF approach on 12/01/21. Post operative echo showed EF 60-65%, normally functioning TAVR with a mean gradient of 28 mmHg and  trivial PVL. She was discharged on aspirin and Plavix x6 months given ViV TAVR. BP was noted to be soft and home Maxide was held.   She was seen in TOC follow up 12/09/21 with no issues. She reported that she felt 100% better with a marked improvement in energy and breathing. She is here today and states continues to do very well with no SOB, LE edema, palpitations, orthopnea, dizziness, or sycnope. Echocardiogram performed prior to appointment which shows a 23 mm CoreValve-Evolut Pro prosthetic (TAVR) valve present in the aortic position, mean gradient 19 mmHg, DI 0.45, and no AI compared to 12/02/21. LVEF at 60 to 65% with Grade I diastolic dysfunction (impaired relaxation). Discussed with patient.    Past Medical History:  Diagnosis Date   Bicuspid aortic valve 2005   Chronic diastolic heart failure (HCC)    Complication of anesthesia 2020   pt states daughter told her that she was told she "stopped breathing for a couple of seconds" during colonoscopy in 2020 and was hard to wake up but no interventions were done per pt   Essential hypertension    Glaucoma    H/O hiatal hernia    Recurrent upper respiratory infection (URI)    S/P aortic valve replacement 01/26/2012   [facility] pericardial tissue valve   S/P patent foramen ovale closure 01/26/2012   PFO found on TEE prior to  AVR. 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 Excell Seltzer and Dr. Laneta Simmers   Type 2 diabetes mellitus [facility])     Past Surgical History:  Procedure Laterality Date   AORTIC VALVE REPLACEMENT  01/26/2012   Procedure: AORTIC VALVE REPLACEMENT (AVR);  Surgeon: Purcell Nails, MD;  Location: [facility] OR;  Service: Open Heart Surgery;  Laterality: N/A;   CARDIAC CATHETERIZATION  2005   Friday March 8th       pcp Dr tapper  in [place]        HYSTEROSCOPY WITH D & C N/A 09/06/2018   Procedure:  DILATATION AND CURETTAGE /HYSTEROSCOPY  ENDOMETRIAL POLYPECTOMY;  Surgeon: Lazaro Arms, MD;  Location: [facility]S;  Service: Gynecology;  Laterality: N/A;   INTRAOPERATIVE TRANSTHORACIC ECHOCARDIOGRAM N/A 12/01/2021   Procedure: INTRAOPERATIVE TRANSTHORACIC ECHOCARDIOGRAM;  Surgeon: [name], MD;  Location: [facility] INVASIVE CV LAB;  Service: Open Heart Surgery;  Laterality: N/A;   LEFT AND RIGHT HEART CATHETERIZATION WITH CORONARY ANGIOGRAM N/A 01/07/2012   Procedure: LEFT AND RIGHT HEART CATHETERIZATION WITH CORONARY ANGIOGRAM;  Surgeon: Wendall Stade, MD;  Location: [facility] CATH LAB;  Service: Cardiovascular;  Laterality: N/A;   RIGHT HEART CATH AND CORONARY ANGIOGRAPHY N/A 10/02/2021   Procedure: RIGHT HEART CATH AND CORONARY ANGIOGRAPHY;  Surgeon: [name], MD;  Location: [facility] INVASIVE CV LAB;  Service: Cardiovascular;  Laterality: N/A;   TEE WITHOUT CARDIOVERSION  01/10/2012   Procedure: TRANSESOPHAGEAL ECHOCARDIOGRAM (TEE);  Surgeon: Pricilla Riffle, MD;  Location: [facility] ENDOSCOPY;  Service: Cardiovascular;  Laterality: N/A;  TEE call Trish in am for time   TEE WITHOUT CARDIOVERSION N/A 09/30/2021   Procedure: TRANSESOPHAGEAL ECHOCARDIOGRAM (TEE) WITH 3D IMAGING;  Surgeon: [name], MD;  Location: [facility] ENDOSCOPY;  Service: Cardiovascular;  Laterality: N/A;   TRANSCATHETER AORTIC VALVE REPLACEMENT, TRANSFEMORAL N/A 12/01/2021   Procedure: TRANSCATHETER AORTIC VALVE REPLACEMENT, TRANSFEMORAL;  Surgeon: [name], MD;  Location: [facility] INVASIVE CV LAB;  Service: Open Heart Surgery;  Laterality: N/A;    Current Medications: Current Meds  Medication Sig   acetaminophen (TYLENOL) 500 MG tablet Take 500-1,000 mg by mouth every 6 (six) hours as needed for moderate pain.   amoxicillin (AMOXIL) 500 MG tablet Take 4 tablets by mouth 1 hour prior to dental procedures and cleanings   aspirin EC 81 MG tablet Take 81 mg by mouth every 6 (six) hours as needed (headache/pain.). Swallow whole.    cetirizine (ZYRTEC) 10 MG tablet Take 10 mg by mouth in the morning.   clopidogrel (PLAVIX) 75 MG tablet Take 1 tablet (75 mg total) by mouth daily.   fluticasone (FLONASE) 50 MCG/ACT nasal spray Place 1 spray into both nostrils daily as needed for allergies or rhinitis.   metFORMIN (GLUCOPHAGE-XR) 500 MG 24 hr tablet Take 500 mg by mouth daily before breakfast.   nicotine polacrilex (NICORETTE) 2 MG gum Take 2 mg by mouth as needed for smoking cessation.   Current Facility-Administered Medications for the 01/01/22 encounter (Office Visit) with [facility]  Medication   sodium chloride flush (NS) 0.9 % injection 3 mL     Allergies:   Peanut-containing drug products and Wellbutrin [bupropion hcl]   Social History   Socioeconomic History   Marital status: Widowed    Spouse name: Not on file   Number of children: 2   Years of education: Not on file  Highest education level: Not on file  Occupational History    Employer: [employer]  Tobacco Use   Smoking status: Former    Packs/day: 1.00    Years: 40.00    Pack years: 40.00    Types: Cigarettes    Quit date: 01/06/2012    Years since quitting: 10.0   Smokeless tobacco: Never  Vaping Use   Vaping Use: Never used  Substance and Sexual Activity   Alcohol use: No    Alcohol/week: 0.0 standard drinks   Drug use: No   Sexual activity: Not Currently    Birth control/protection: Post-menopausal  Other Topics Concern   Not on file  Social History Narrative   She is from [place], [place]. She is widowed. Her husband committed suicide sometime last year, but she feels that she has dealt with it. She currently lives alone and works at [employer]    Social Determinants of Health   Financial Resource Strain: Not on file  Food Insecurity: Not on file  Transportation Needs: Not on file  Physical Activity: Not on file  Stress: Not on file  Social Connections: Not on file     Family History: The patient's  family history includes Cancer in her sister; Hypertension in her brother and son; Lung cancer in her father; Uterine cancer in her mother.  ROS:   Please see the history of present illness.    All other systems reviewed and are negative.  EKGs/Labs/Other Studies Reviewed:    The following studies were reviewed today:  Echocardiogram 01/01/22:  S/P valve in valve TAVR; mean gradient 19 mmHg; DI 0.45; no AI; compared to 12/02/21, no AI is present on this study and aortic valve mean gradient has decreased. 1. Left ventricular ejection fraction, by estimation, is 60 to 65%. The left ventricle has normal function. The left ventricle has no regional wall motion abnormalities. There is mild left ventricular hypertrophy. Left ventricular diastolic parameters are consistent with Grade I diastolic dysfunction (impaired relaxation). Elevated left atrial pressure. 2. 3. Right ventricular systolic function is normal. The right ventricular size is normal. The mitral valve is normal in structure. Trivial mitral valve regurgitation. No evidence of mitral stenosis. 4. The aortic valve has been repaired/replaced. Aortic valve regurgitation is not visualized. No aortic stenosis is present. There is a 23 mm CoreValve-Evolut Pro prosthetic (TAVR) valve present in the aortic position. Procedure Date: 12/01/21. 5. 6. Aortic dilatation noted. There is mild dilatation of the aortic root, measuring 43 mm. The inferior vena cava is normal in size with greater than 50% respiratory variability, suggesting right atrial pressure of 3 mmHg.    TAVR OPERATIVE NOTE     Date of Procedure:                12/01/2021    Preoperative Diagnosis:      Severe Aortic Stenosis    Postoperative Diagnosis:    Same    Procedure:        Transcatheter Aortic Valve Replacement (Valve-In-Valve) - Percutaneous  Transfemoral Approach             Medtronic Evolut Pro-Plus (size 23 mm, serial # [id number])               Co-Surgeons:                        Alleen Borne, MD and [name], MD   Anesthesiologist:  Karna Christmas, MD   Echocardiographer:              Charlton Haws, MD   Pre-operative Echo Findings: Severe bioprosthetic aortic stenosis Normal left ventricular systolic function   Post-operative Echo Findings: No paravalvular leak Normal left ventricular systolic function with mean transvalvular gradient 11 mmHg _____________   Echo 12/02/21: IMPRESSIONS  1. Left ventricular ejection fraction, by estimation, is 60 to 65%. The  left ventricle has normal function. The left ventricle has no regional  wall motion abnormalities. There is mild concentric left ventricular  hypertrophy. Left ventricular diastolic  parameters are consistent with Grade III diastolic dysfunction  (restrictive).   2. Right ventricular systolic function is normal. The right ventricular  size is normal. There is normal pulmonary artery systolic pressure.   3. The mitral valve is abnormal. No evidence of mitral valve  regurgitation. No evidence of mitral stenosis.   4. Well seated bioprosthetic aortic valve. The aortic valve has been  repaired/replaced. Central aortic valve regurgitation is not visualized.  There is very trivial paravalvular leak in the 12oclock position in the  psax view. No aortic stenosis is  present. There is a 23 mm Evolut Pro+ 22% oversized valve present in the  aortic position. Procedure Date: 12/01/21.   5. Aneurysm of the ascending aorta, measuring 40 mm.   6. The inferior vena cava is normal in size with greater than 50%  respiratory variability, suggesting right atrial pressure of 3 mmHg.  EKG:  EKG is not ordered today.   Recent Labs: 11/27/2021: ALT 25; B Natriuretic Peptide 45.0 12/02/2021: BUN 17; Creatinine, Ser 0.92; Hemoglobin 11.7; Platelets 158; Potassium 3.6; Sodium 137  Recent Lipid Panel No results found for: CHOL, TRIG, HDL, CHOLHDL, VLDL, LDLCALC,  LDLDIRECT   Physical Exam:    VS:  BP 130/90    Pulse 75    Ht 5' 6.5" (1.689 m)    Wt 177 lb 3.2 oz (80.4 kg)    LMP  (LMP Unknown)    SpO2 95%    BMI 28.17 kg/m     Wt Readings from Last 3 Encounters:  01/01/22 177 lb 3.2 oz (80.4 kg)  12/09/21 176 lb (79.8 kg)  12/02/21 178 lb 5.6 oz (80.9 kg)    General: Well developed, well nourished, NAD Neck: Negative for carotid bruits. No JVD Lungs:Clear to ausculation bilaterally. Breathing is unlabored. Cardiovascular: RRR with S1 S2. No murmur Extremities: No edema.  Neuro: Alert and oriented. No focal deficits. No facial asymmetry. MAE spontaneously. Psych: Responds to questions appropriately with normal affect.    ASSESSMENT/PLAN:    Severe bioprosthetic valve dysfunction with severe AS s/p ViV TAVR: No complaints today, doing very well after TAVR with NYHA class I symptoms. Echocardiogram today a 23 mm CoreValve-Evolut Pro prosthetic (TAVR) valve present in the aortic position, mean gradient 19 mmHg, DI 0.45, and no AI compared to 12/02/21. LVEF at 60 to 65% with Grade I diastolic dysfunction (impaired relaxation). Plan to continue with DAPT with ASA and Plavix for 6 months. She has follow up with Dr. Diona Browner 02/04/22. SBE discussed. She has previously been prescribed Amoxicillin.    HTN: Stable, 130/90, not currently on antihypertensives. Previously on Maxide.    TAA: pre TAVR CTA showed a 4.6 cm fusiform ascending aortic aneurysm. Will require semi annual follow up.    Medication Adjustments/Labs and Tests Ordered: Current medicines are reviewed at length with the patient today.  Concerns regarding medicines are outlined above.  Orders Placed  This Encounter  Procedures   ECHOCARDIOGRAM COMPLETE   No orders of the defined types were placed in this encounter.   Patient Instructions  Medication Instructions:  Your physician recommends that you continue on your current medications as directed. Please refer to the Current Medication  list given to you today.  *If you need a refill on your cardiac medications before your next appointment, please call your pharmacy*   Lab Work: None ordered  If you have labs (blood work) drawn today and your tests are completely normal, you will receive your results only by: MyChart Message (if you have MyChart) OR A paper copy in the mail If you have any lab test that is abnormal or we need to change your treatment, we will call you to review the results.   Testing/Procedures: Your physician has requested that you have an echocardiogram. Echocardiography is a painless test that uses sound waves to create images of your heart. It provides your doctor with information about the size and shape of your heart and how well your hearts chambers and valves are working. This procedure takes approximately one hour. There are no restrictions for this procedure.    Follow-Up: At [facility], you and your health needs are our priority.  As part of our continuing mission to provide you with exceptional heart care, we have created designated Provider Care Teams.  These Care Teams include your primary Cardiologist (physician) and Advanced Practice Providers (APPs -  Physician Assistants and Nurse Practitioners) who all work together to provide you with the care you need, when you need it.  We recommend signing up for the patient portal called "MyChart".  Sign up information is provided on this After Visit Summary.  MyChart is used to connect with patients for Virtual Visits (Telemedicine).  Patients are able to view lab/test results, encounter notes, upcoming appointments, etc.  Non-urgent messages can be sent to your provider as well.   To learn more about what you can do with MyChart, go to [email or web address].    Your next appointment:   Keep your appointments as scheduled  The format for your next appointment:   In Person  Provider:     Other Instructions     Signed, Georgie ChardJill  Percival Glasheen, NP  01/05/2022 8:04 AM    [facility]

## 2022-01-01 ENCOUNTER — Other Ambulatory Visit: Payer: Self-pay

## 2022-01-01 ENCOUNTER — Ambulatory Visit (HOSPITAL_COMMUNITY): Payer: Medicare Other | Attending: Physician Assistant

## 2022-01-01 ENCOUNTER — Ambulatory Visit (HOSPITAL_COMMUNITY): Payer: Medicare Other | Attending: Cardiology

## 2022-01-01 ENCOUNTER — Ambulatory Visit: Payer: Medicare Other | Admitting: Cardiology

## 2022-01-01 ENCOUNTER — Encounter: Payer: Self-pay | Admitting: Cardiology

## 2022-01-01 VITALS — BP 130/90 | HR 75 | Ht 66.5 in | Wt 177.2 lb

## 2022-01-01 DIAGNOSIS — I10 Essential (primary) hypertension: Secondary | ICD-10-CM

## 2022-01-01 DIAGNOSIS — Z952 Presence of prosthetic heart valve: Secondary | ICD-10-CM | POA: Diagnosis not present

## 2022-01-01 DIAGNOSIS — Z953 Presence of xenogenic heart valve: Secondary | ICD-10-CM

## 2022-01-01 DIAGNOSIS — I712 Thoracic aortic aneurysm, without rupture, unspecified: Secondary | ICD-10-CM | POA: Diagnosis not present

## 2022-01-01 LAB — ECHOCARDIOGRAM COMPLETE
AR max vel: 1.47 cm2
AV Area VTI: 1.48 cm2
AV Area mean vel: 1.52 cm2
AV Mean grad: 19 mmHg
AV Peak grad: 33.9 mmHg
Ao pk vel: 2.91 m/s
Area-P 1/2: 2.83 cm2
Height: 66.5 in
S' Lateral: 2.3 cm
Weight: 2835.2 oz

## 2022-01-01 NOTE — Patient Instructions (Addendum)
Medication Instructions:  ?Your physician recommends that you continue on your current medications as directed. Please refer to the Current Medication list given to you today. ? ?*If you need a refill on your cardiac medications before your next appointment, please call your pharmacy* ? ? ?Lab Work: ?None ordered ? ?If you have labs (blood work) drawn today and your tests are completely normal, you will receive your results only by: ?MyChart Message (if you have MyChart) OR ?A paper copy in the mail ?If you have any lab test that is abnormal or we need to change your treatment, we will call you to review the results. ? ? ?Testing/Procedures: ?Your physician has requested that you have an echocardiogram. Echocardiography is a painless test that uses sound waves to create images of your heart. It provides your doctor with information about the size and shape of your heart and how well your heart?s chambers and valves are working. This procedure takes approximately one hour. There are no restrictions for this procedure. ? ? ? ?Follow-Up: ?At [facility], you and your health needs are our priority.  As part of our continuing mission to provide you with exceptional heart care, we have created designated Provider Care Teams.  These Care Teams include your primary Cardiologist (physician) and Advanced Practice Providers (APPs -  Physician Assistants and Nurse Practitioners) who all work together to provide you with the care you need, when you need it. ? ?We recommend signing up for the patient portal called "MyChart".  Sign up information is provided on this After Visit Summary.  MyChart is used to connect with patients for Virtual Visits (Telemedicine).  Patients are able to view lab/test results, encounter notes, upcoming appointments, etc.  Non-urgent messages can be sent to your provider as well.   ?To learn more about what you can do with MyChart, go to [email or web address].   ? ?Your next appointment:   ?Keep  your appointments as scheduled ? ?The format for your next appointment:   ?In Person ? ?Provider:   ? ? ?Other Instructions ? ? ?

## 2022-01-01 NOTE — Progress Notes (Unsigned)
Patient ID: Courtney Harvey, female   DOB: 1956-05-22, 66 y.o.   MRN: [id number] ? ?Verified appointment "no show" status with Pearline Cables at 11:49am.  ?

## 2022-01-04 ENCOUNTER — Telehealth: Payer: Self-pay | Admitting: Cardiology

## 2022-01-05 ENCOUNTER — Other Ambulatory Visit: Payer: Self-pay | Admitting: Physician Assistant

## 2022-02-04 ENCOUNTER — Encounter: Payer: Self-pay | Admitting: Cardiology

## 2022-02-04 ENCOUNTER — Ambulatory Visit: Payer: Medicare Other | Admitting: Cardiology

## 2022-02-04 VITALS — BP 121/80 | HR 75 | Ht 66.5 in | Wt 177.8 lb

## 2022-02-04 DIAGNOSIS — I10 Essential (primary) hypertension: Secondary | ICD-10-CM

## 2022-02-04 DIAGNOSIS — Z952 Presence of prosthetic heart valve: Secondary | ICD-10-CM | POA: Diagnosis not present

## 2022-02-04 NOTE — Progress Notes (Signed)
? ? ?Cardiology Office Note ? ?Date: 02/04/2022  ? ?ID: Courtney Harvey, DOB 05-27-1956, MRN [id number] ? ?PCP:  Suzan Slick, MD  ?Cardiologist:  Nona Dell, MD ?Electrophysiologist:  None  ? ?Chief Complaint  ?Patient presents with  ? Cardiac follow-up  ? ? ?History of Present Illness: ?Courtney Harvey is a 66 y.o. female seen recently in the structural heart clinic in March.  She is status post valve in valve TAVR with 23 mm Medtronic Evolut Pro-Plus in January for treatment of bioprosthetic aortic stenosis. Plan is for 6 months of aspirin and Plavix. ? ?She is here for routine evaluation, states that she feels much better, NYHA class I-II dyspnea.  No exertional chest pain, palpitations, or syncope. ? ?Recent echocardiogram showed LVEF of 60 to 65% with stable TAVR prosthesis, mean gradient 19 mmHg and no significant paravalvular regurgitation. ? ?Cardiac catheterization prior to TAVR showed only mild coronary luminal irregularities. ? ?She is no longer on antihypertensive therapy, blood pressure normal today.  I did ask her to make sure that she checks her blood pressure periodically with home cuff.  She reports compliance with aspirin and Plavix, no bleeding problems. ? ?Past Medical History:  ?Diagnosis Date  ? Bicuspid aortic valve 2005  ? Chronic diastolic heart failure (HCC)   ? Complication of anesthesia 2020  ? pt states daughter told her that she was told she "stopped breathing for a couple of seconds" during colonoscopy in 2020 and was hard to wake up but no interventions were done per pt  ? Essential hypertension   ? Glaucoma   ? H/O hiatal hernia   ? Recurrent upper respiratory infection (URI)   ? 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 22mm [facility] Ease pericardial tissue valve  ? S/P patent foramen ovale closure 01/26/2012  ? PFO found on TEE prior to AVR. Performed at the time of aortic valve replacement  ? 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 Valve in  valve with 67mm Evolut Pro+ 22% oversized with Dr. Excell Seltzer and Dr. Laneta Simmers  ? Type 2 diabetes mellitus (HCC)   ? ? ?Past Surgical History:  ?Procedure Laterality Date  ? AORTIC VALVE REPLACEMENT  01/26/2012  ? Procedure: AORTIC VALVE REPLACEMENT (AVR);  Surgeon: Purcell Nails, MD;  Location: [facility] OR;  Service: Open Heart Surgery;  Laterality: N/A;  ? CARDIAC CATHETERIZATION  2005  ? Friday March 8th       pcp Dr tapper  in [place]       ? HYSTEROSCOPY WITH D & C N/A 09/06/2018  ? Procedure: DILATATION AND CURETTAGE /HYSTEROSCOPY  ENDOMETRIAL POLYPECTOMY;  Surgeon: Lazaro Arms, MD;  Location: [facility] ORS;  Service: Gynecology;  Laterality: N/A;  ? INTRAOPERATIVE TRANSTHORACIC ECHOCARDIOGRAM N/A 12/01/2021  ? Procedure: INTRAOPERATIVE TRANSTHORACIC ECHOCARDIOGRAM;  Surgeon: [name], MD;  Location: [facility] INVASIVE CV LAB;  Service: Open Heart Surgery;  Laterality: N/A;  ? LEFT AND RIGHT HEART CATHETERIZATION WITH CORONARY ANGIOGRAM N/A 01/07/2012  ? Procedure: LEFT AND RIGHT HEART CATHETERIZATION WITH CORONARY ANGIOGRAM;  Surgeon: Wendall Stade, MD;  Location: [facility] CATH LAB;  Service: Cardiovascular;  Laterality: N/A;  ? RIGHT HEART CATH AND CORONARY ANGIOGRAPHY N/A 10/02/2021  ? Procedure: RIGHT HEART CATH AND CORONARY ANGIOGRAPHY;  Surgeon: [name], MD;  Location: [facility] INVASIVE CV LAB;  Service: Cardiovascular;  Laterality: N/A;  ? TEE WITHOUT CARDIOVERSION  01/10/2012  ? Procedure: TRANSESOPHAGEAL ECHOCARDIOGRAM (TEE);  Surgeon: Pricilla Riffle, MD;  Location: [facility] ENDOSCOPY;  Service: Cardiovascular;  Laterality: N/A;  TEE call Trish in am for time  ? TEE WITHOUT CARDIOVERSION N/A 09/30/2021  ? Procedure: TRANSESOPHAGEAL ECHOCARDIOGRAM (TEE) WITH 3D IMAGING;  Surgeon: Freada Bergeron, MD;  Location: [facility];  Service: Cardiovascular;  Laterality: N/A;  ? TRANSCATHETER AORTIC VALVE REPLACEMENT, TRANSFEMORAL N/A 12/01/2021  ? Procedure: TRANSCATHETER AORTIC VALVE REPLACEMENT, TRANSFEMORAL;  Surgeon: Sherren Mocha, MD;  Location: [place] CV LAB;  Service: Open Heart Surgery;  Laterality: N/A;  ? ? ?Current Outpatient Medications  ?Medication Sig Dispense Refill  ? acetaminophen (TYLENOL) 500 MG tablet Take 500-1,000 mg by mouth every 6 (six) hours as needed for moderate pain.    ? amoxicillin (AMOXIL) 500 MG tablet Take 4 tablets by mouth 1 hour prior to dental procedures and cleanings 12 tablet 6  ? aspirin EC 81 MG tablet Take 81 mg by mouth daily. Swallow whole.    ? cetirizine (ZYRTEC) 10 MG tablet Take 10 mg by mouth in the morning.    ? clopidogrel (PLAVIX) 75 MG tablet Take 1 tablet (75 mg total) by mouth daily. 90 tablet 6  ? fluticasone (FLONASE) 50 MCG/ACT nasal spray Place 1 spray into both nostrils daily as needed for allergies or rhinitis.    ? metFORMIN (GLUCOPHAGE-XR) 500 MG 24 hr tablet Take 500 mg by mouth daily before breakfast.    ? nicotine polacrilex (NICORETTE) 2 MG gum Take 2 mg by mouth as needed for smoking cessation.    ? ?Current Facility-Administered Medications  ?Medication Dose Route Frequency Provider Last Rate Last Admin  ? sodium chloride flush (NS) 0.9 % injection 3 mL  3 mL Intravenous Q12H Satira Sark, MD      ? ?Allergies:  Peanut-containing drug products and Wellbutrin [bupropion hcl]  ? ?ROS: No orthopnea or PND. ? ?Physical Exam: ?VS:  BP 121/80   Pulse 75   Ht 5' 6.5" (1.689 m)   Wt 177 lb 12.8 oz (80.6 kg)   LMP  (LMP Unknown)   SpO2 98%   BMI 28.27 kg/m? , BMI Body mass index is 28.27 kg/m?. ? ?Wt Readings from Last 3 Encounters:  ?02/04/22 177 lb 12.8 oz (80.6 kg)  ?01/01/22 177 lb 3.2 oz (80.4 kg)  ?12/09/21 176 lb (79.8 kg)  ?  ?General: Patient appears comfortable at rest. ?HEENT: Conjunctiva and lids normal. ?Neck: Supple, no elevated JVP or carotid bruits, no thyromegaly. ?Lungs: Clear to auscultation, nonlabored breathing at rest. ?Cardiac: Regular rate and rhythm, no S3, 99991111 systolic murmur, no pericardial rub. ?Extremities: No pitting  edema. ? ?ECG:  An ECG dated 12/09/2021 was personally reviewed today and demonstrated:  Sinus rhythm with nonspecific ST changes and PVC. ? ?Recent Labwork: ?11/27/2021: ALT 25; AST 26; B Natriuretic Peptide 45.0 ?12/02/2021: BUN 17; Creatinine, Ser 0.92; Hemoglobin 11.7; Platelets 158; Potassium 3.6; Sodium 137  ? ?Other Studies Reviewed Today: ? ?Echocardiogram 01/01/2022: ? 1. S/P valve in valve TAVR; mean gradient 19 mmHg; DI 0.45; no AI;  ?compared to 12/02/21, no AI is present on this study and aortic valve mean  ?gradient has decreased.  ? 2. Left ventricular ejection fraction, by estimation, is 60 to 65%. The  ?left ventricle has normal function. The left ventricle has no regional  ?wall motion abnormalities. There is mild left ventricular hypertrophy.  ?Left ventricular diastolic parameters  ?are consistent with Grade I diastolic dysfunction (impaired relaxation).  ?Elevated left atrial pressure.  ? 3. Right ventricular systolic function is normal. The right ventricular  ?  size is normal.  ? 4. The mitral valve is normal in structure. Trivial mitral valve  ?regurgitation. No evidence of mitral stenosis.  ? 5. The aortic valve has been repaired/replaced. Aortic valve  ?regurgitation is not visualized. No aortic stenosis is present. There is a  ?23 mm CoreValve-Evolut Pro prosthetic (TAVR) valve present in the aortic  ?position. Procedure Date: 12/01/21.  ? 6. Aortic dilatation noted. There is mild dilatation of the aortic root,  ?measuring 43 mm.  ? 7. The inferior vena cava is normal in size with greater than 50%  ?respiratory variability, suggesting right atrial pressure of 3 mmHg.  ? ?Assessment and Plan: ? ?1.  Severe bioprosthetic aortic valve stenosis status post valve in valve TAVR back in January as discussed above.  She is doing very well at this point, NYHA class I-II dyspnea.  I reviewed her recent echocardiogram.  She remains on aspirin and Plavix which we will continue for 26-month course.  Keep follow-up  in the structural heart clinic later this year, and we will see her 6 months thereafter. ? ?2.  Essential hypertension, previously on Maxide.  Blood pressure is normal today on no specific antihypertensive treatment.  Recom

## 2022-02-04 NOTE — Patient Instructions (Signed)

## 2022-04-07 NOTE — Telephone Encounter (Signed)
error 

## 2022-07-06 NOTE — Progress Notes (Signed)
[facility]   MULTIDISCIPLINARY HEART VALVE CLINIC                                     Cardiology Office Note:    Date:  07/07/2022   ID:  Courtney Harvey, DOB [birth year], MRN [id number]  PCP:  Suzan Slickucker, Alethea Y, MD  [facility] Cardiologist:  Nona DellSamuel McDowell, MD/ Dr. Excell Seltzerooper, MD & Dr. Laneta SimmersBartle, MD (ViV TAVR) [facility] Electrophysiologist:  None   Referring MD: Suzan Slickucker, Alethea Y, MD   Chief Complaint  Patient presents with   Follow-up    6 month ViV follow up    History of Present Illness:    Courtney Harvey is a 66 y.o. 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   Echocardiogram from 09/11/2021 showed a mean gradient of 61 mmHg with a peak gradient of 104.2 mmHg and a valve area by VTI of 0.83 cm. Left ventricular ejection fraction was 70 to 75%. TEE 09/30/2021 showed severe prosthetic aortic valve stenosis. Valve area by planimetry was 0.63 cm with a mean gradient of 55 mmHg. Peak gradient was 95 mmHg. Dimensionless index of 0.2. Aortic regurgitation was trivial. There was severe thickening and calcification of the leaflets along the commissures with severely restricted leaflet motion. Ejection fraction was 60 to 65%. Cardiac catheterization on 10/02/2021 showed patent coronary arteries with minimal luminal irregularities. Right heart pressures were normal.   She presented to see Dr. Laneta SimmersBartle 11/20/21 after being referred for progressive dyspnea with exertion, chest discomfort, and exertional fatigue. She was not felt to be a surgical candidate given Jehovah Witness status.    She underwent successful valve in valve TAVR with a 23 mm Medtronic Evolut Pro-Plus via the TF approach on 12/01/21. Post operative echo showed EF 60-65%, normally functioning TAVR with a  mean gradient of 28 mmHg and trivial PVL. She was discharged on aspirin and Plavix x6 months given ViV TAVR. BP was noted to be soft and home Maxide was stopped.    She was seen in TOC follow up 12/09/21 with no issues. She reported that she felt 100% better with a marked improvement in energy and breathing. One month post op echocardiogram showed a 23 mm CoreValve-Evolut Pro prosthetic (TAVR) valve present in the aortic position, mean gradient 19 mmHg, DI 0.45, and no AI compared to 12/02/21. LVEF at 60 to 65% with Grade I diastolic dysfunction (impaired relaxation).   She was seen Dr. Diona BrownerMcDowell 01/2022 at which time she continued to do well with no complaints. She is here today alone and states that she has noticed some intermittent LE edema along with mild exertional SOB. Her ankles are somewhat swollen today. Home BPs reported in the 140/90 range. She also has intermittent palpitations which will last 1-2 minutes at a time, occurring several times per week. She has no SOB with the palpitations, no chest pain, dizziness, or syncope. Denies orthopnea. She was previously taking Maxide however this was stopped after TAVR given soft BPs.    Past Medical History:  Diagnosis Date   Bicuspid aortic valve 2005   Chronic diastolic heart failure (HCC)    Complication of anesthesia 2020   pt states daughter told  her that she was told she "stopped breathing for a couple of seconds" during colonoscopy in 2020 and was hard to wake up but no interventions were done per pt   Essential hypertension    Glaucoma    H/O hiatal hernia    Recurrent upper respiratory infection (URI)    S/P aortic valve replacement 01/26/2012   54mm [facility] Ease pericardial tissue valve   S/P patent foramen ovale closure 01/26/2012   PFO found on TEE prior to AVR. 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 Excell Seltzer and Dr. Laneta Simmers   Type 2 diabetes mellitus [facility])     Past Surgical History:  Procedure Laterality Date   AORTIC VALVE REPLACEMENT  01/26/2012   Procedure: AORTIC VALVE REPLACEMENT (AVR);  Surgeon: Purcell Nails, MD;  Location: [facility] OR;  Service: Open Heart Surgery;  Laterality: N/A;   CARDIAC CATHETERIZATION  2005   Friday March 8th       pcp Dr tapper  in [place]        HYSTEROSCOPY WITH D & C N/A 09/06/2018   Procedure: DILATATION AND CURETTAGE /HYSTEROSCOPY  ENDOMETRIAL POLYPECTOMY;  Surgeon: Lazaro Arms, MD;  Location: [facility];  Service: Gynecology;  Laterality: N/A;   INTRAOPERATIVE TRANSTHORACIC ECHOCARDIOGRAM N/A 12/01/2021   Procedure: INTRAOPERATIVE TRANSTHORACIC ECHOCARDIOGRAM;  Surgeon: [name], MD;  [facility] INVASIVE CV LAB;  Service: Open Heart Surgery;  Laterality: N/A;   LEFT AND RIGHT HEART CATHETERIZATION WITH CORONARY ANGIOGRAM N/A 01/07/2012   Procedure: LEFT AND RIGHT HEART CATHETERIZATION WITH CORONARY ANGIOGRAM;  Surgeon: Wendall Stade, MD;  Location: [facility] CATH LAB;  Service: Cardiovascular;  Laterality: N/A;   RIGHT HEART CATH AND CORONARY ANGIOGRAPHY N/A 10/02/2021   Procedure: RIGHT HEART CATH AND CORONARY ANGIOGRAPHY;  Surgeon: [name], MD;  Location: [facility] INVASIVE CV LAB;  Service: Cardiovascular;  Laterality: N/A;   TEE WITHOUT CARDIOVERSION  01/10/2012   Procedure: TRANSESOPHAGEAL ECHOCARDIOGRAM (TEE);  Surgeon: Pricilla Riffle, MD;  Location: [facility] ENDOSCOPY;  Service: Cardiovascular;  Laterality: N/A;  TEE call Trish in am for time   TEE WITHOUT CARDIOVERSION N/A 09/30/2021   Procedure: TRANSESOPHAGEAL ECHOCARDIOGRAM (TEE) WITH 3D IMAGING;  Surgeon: [name], MD;  Location: [facility] ENDOSCOPY;  Service: Cardiovascular;  Laterality: N/A;   TRANSCATHETER AORTIC VALVE REPLACEMENT, TRANSFEMORAL N/A 12/01/2021   Procedure: TRANSCATHETER AORTIC VALVE REPLACEMENT, TRANSFEMORAL;  Surgeon: [name], MD;  Location: [facility] INVASIVE CV LAB;   Service: Open Heart Surgery;  Laterality: N/A;    Current Medications: Current Meds  Medication Sig   acetaminophen (TYLENOL) 500 MG tablet Take 500-1,000 mg by mouth every 6 (six) hours as needed for moderate pain.   amoxicillin (AMOXIL) 500 MG tablet Take 4 tablets by mouth 1 hour prior to dental procedures and cleanings   aspirin EC 81 MG tablet Take 81 mg by mouth daily. Swallow whole.   cetirizine (ZYRTEC) 10 MG tablet Take 10 mg by mouth in the morning.   fluticasone (FLONASE) 50 MCG/ACT nasal spray Place 1 spray into both nostrils daily as needed for allergies or rhinitis.   furosemide (LASIX) 20 MG tablet Take 1 tablet (20 mg total) by mouth as needed for fluid or edema.   metFORMIN (GLUCOPHAGE-XR) 500 MG 24 hr tablet Take 500 mg by mouth daily before breakfast.   metoprolol tartrate (LOPRESSOR) 25 MG tablet Take 1 tablet (25  mg total) by mouth 2 (two) times daily.   nicotine polacrilex (NICORETTE) 2 MG gum Take 2 mg by mouth as needed for smoking cessation.   Current Facility-Administered Medications for the 07/07/22 encounter (Office Visit) with [facility]  Medication   sodium chloride flush (NS) 0.9 % injection 3 mL     Allergies:   Peanut-containing drug products and Wellbutrin [bupropion hcl]   Social History   Socioeconomic History   Marital status: Widowed    Spouse name: Not on file   Number of children: 2   Years of education: Not on file   Highest education level: Not on file  Occupational History    Employer: [employer]  Tobacco Use   Smoking status: Former    Packs/day: 1.00    Years: 40.00    Total pack years: 40.00    Types: Cigarettes    Quit date: 01/06/2012    Years since quitting: 10.5   Smokeless tobacco: Never  Vaping Use   Vaping Use: Never used  Substance and Sexual Activity   Alcohol use: No    Alcohol/week: 0.0 standard drinks of alcohol   Drug use: No   Sexual activity: Not Currently    Birth control/protection:  Post-menopausal  Other Topics Concern   Not on file  Social History Narrative   She is from [place], [place]. She is widowed. Her husband committed suicide sometime last year, but she feels that she has dealt with it. She currently lives alone and works at [employer].    Social Determinants of Health   Financial Resource Strain: Not on file  Food Insecurity: Not on file  Transportation Needs: Not on file  Physical Activity: Not on file  Stress: Not on file  Social Connections: Not on file     Family History: The patient's family history includes Cancer in her sister; Hypertension in her brother and son; Lung cancer in her father; Uterine cancer in her mother.  ROS:   Please see the history of present illness.    All other systems reviewed and are negative.  EKGs/Labs/Other Studies Reviewed:    The following studies were reviewed today:  Echocardiogram 01/01/22:   1. S/P valve in valve TAVR; mean gradient 19 mmHg; DI 0.45; no AI;  compared to 12/02/21, no AI is present on this study and aortic valve mean  gradient has decreased.   2. Left ventricular ejection fraction, by estimation, is 60 to 65%. The  left ventricle has normal function. The left ventricle has no regional  wall motion abnormalities. There is mild left ventricular hypertrophy.  Left ventricular diastolic parameters  are consistent with Grade I diastolic dysfunction (impaired relaxation).  Elevated left atrial pressure.   3. Right ventricular systolic function is normal. The right ventricular  size is normal.   4. The mitral valve is normal in structure. Trivial mitral valve  regurgitation. No evidence of mitral stenosis.   5. The aortic valve has been repaired/replaced. Aortic valve  regurgitation is not visualized. No aortic stenosis is present. 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 Procedure Date: 12/01/21.   6. Aortic dilatation noted. There is mild  dilatation of the aortic root,  measuring 43 mm.   7. The inferior vena cava is normal in size with greater than 50%  respiratory variability, suggesting right atrial pressure of 3 mmHg.    TAVR OPERATIVE NOTE     Date of Procedure:  12/01/2021    Preoperative Diagnosis:      Severe Aortic Stenosis    Postoperative Diagnosis:    Same    Procedure:        Transcatheter Aortic Valve Replacement (Valve-In-Valve) - Percutaneous  Transfemoral Approach             Medtronic Evolut Pro-Plus (size 23 mm, serial # [id number])              Co-Surgeons:                        Alleen Borne, MD and [name], MD   Anesthesiologist:                  Karna Christmas, MD   Echocardiographer:              Charlton Haws, MD   Pre-operative Echo Findings: Severe bioprosthetic aortic stenosis Normal left ventricular systolic function   Post-operative Echo Findings: No paravalvular leak Normal left ventricular systolic function with mean transvalvular gradient 11 mmHg _____________   Echo 12/02/21: IMPRESSIONS  1. Left ventricular ejection fraction, by estimation, is 60 to 65%. The  left ventricle has normal function. The left ventricle has no regional  wall motion abnormalities. There is mild concentric left ventricular  hypertrophy. Left ventricular diastolic  parameters are consistent with Grade III diastolic dysfunction  (restrictive).   2. Right ventricular systolic function is normal. The right ventricular  size is normal. There is normal pulmonary artery systolic pressure.   3. The mitral valve is abnormal. No evidence of mitral valve  regurgitation. No evidence of mitral stenosis.   4. Well seated bioprosthetic aortic valve. The aortic valve has been  repaired/replaced. Central aortic valve regurgitation is not visualized.  There is very trivial paravalvular leak in the 12oclock position in the  psax view. No aortic stenosis is  present. There is a 23 mm Evolut Pro+  22% oversized valve present in the  aortic position. Procedure Date: 12/01/21.   5. Aneurysm of the ascending aorta, measuring 40 mm.   6. The inferior vena cava is normal in size with greater than 50%  respiratory variability, suggesting right atrial pressure of 3 mmHg.    EKG:  EKG is ordered today.  The ekg ordered today demonstrates NSR  Recent Labs: 11/27/2021: ALT 25; B Natriuretic Peptide 45.0 12/02/2021: BUN 17; Creatinine, Ser 0.92; Hemoglobin 11.7; Platelets 158; Potassium 3.6; Sodium 137  Recent Lipid Panel No results found for: "CHOL", "TRIG", "HDL", "CHOLHDL", "VLDL", "LDLCALC", "LDLDIRECT"  Physical Exam:    VS:  BP (!) 140/100 Comment: left arm 130/90  Pulse 81   Ht 5' 6.5" (1.689 m)   Wt 177 lb (80.3 kg)   LMP  (LMP Unknown)   SpO2 99%   BMI 28.14 kg/m     Wt Readings from Last 3 Encounters:  07/07/22 177 lb (80.3 kg)  02/04/22 177 lb 12.8 oz (80.6 kg)  01/01/22 177 lb 3.2 oz (80.4 kg)    General: Well developed, well nourished, NAD Neck: Negative for carotid bruits. No JVD Lungs:Clear to ausculation bilaterally. No wheezes, rales, or rhonchi. Breathing is unlabored. Cardiovascular: RRR with S1 S2. Soft murmurs Extremities: 1+ BLE edema.  Neuro: Alert and oriented. No focal deficits. No facial asymmetry. MAE spontaneously. Psych: Responds to questions appropriately with normal affect.    ASSESSMENT/PLAN:    Severe bioprosthetic valve dysfunction with severe AS s/p ViV TAVR: One month echocardiogram with  a 23 mm CoreValve-Evolut Pro prosthetic (TAVR) valve present in the aortic position, mean gradient 19 mmHg, DI 0.45, and no AI compared to 12/02/21. LVEF at 60 to 65% with Grade I diastolic dysfunction. Having mild exertional SOB and intermittent LE edema but overall remains improved after ViV TAVR. Will add low dose Lasix  PRN for LE edema. If she requires more than 2-3 doses per week, she will call the office to inform our team. Will stop Plavix today and  continue ASA indefinitely. Lifelong SBE with Amocicillin for dental cleanings and procedures. Keep follow up with echocardiogram 12/2022.   HTN: Maxide previously stopped in the post TAVR setting due to soft BPs. Reports home SBPs in the 140 range. Will have her start metoprolol  BID (due to c/o of palpitations as well) and consolidate once dose established.   Palpitations: Reports intermittent palpitations. No associated symptoms such as SOB, dizziness, syncope. No hx of AF. Will rule out AF with 2 week ZIO.   LE edema: Reports some dependent LE edema. Mild today. Will start PRN Lasix . If she requires more than 2-3 doses per week, she will need to call for re evaluation.    TAA: pre TAVR CTA showed a 4.6 cm fusiform ascending aortic aneurysm. Will require semi annual follow up. Evaluate with echo 12/2022.      Medication Adjustments/Labs and Tests Ordered: Current medicines are reviewed at length with the patient today.  Concerns regarding medicines are outlined above.  Orders Placed This Encounter  Procedures   LONG TERM MONITOR (3-14 DAYS)   EKG 12-Lead   Meds ordered this encounter  Medications   metoprolol tartrate (LOPRESSOR) 25 MG tablet    Sig: Take 1 tablet (25 mg total) by mouth 2 (two) times daily.    Dispense:  180 tablet    Refill:  3   furosemide (LASIX) 20 MG tablet    Sig: Take 1 tablet (20 mg total) by mouth as needed for fluid or edema.    Dispense:  90 tablet    Refill:  3    Patient Instructions  Medication Instructions:  Your physician has recommended you make the following change in your medication:  STOP PLAVIX START METOPROLOL 25 MG TWICE DAILY  START LASIX 20 MG AS NEEDED  *If you need a refill on your cardiac medications before your next appointment, please call your pharmacy*   Lab Work: NONE If you have labs (blood work) drawn today and your tests are completely normal, you will receive your results only by: MyChart Message (if you have  MyChart) OR A paper copy in the mail If you have any lab test that is abnormal or we need to change your treatment, we will call you to review the results.   Testing/Procedures: Christena Deem- Long Term Monitor Instructions  Your physician has requested you wear a ZIO patch monitor for 14 days.  This is a single patch monitor. [employer] supplies one patch monitor per enrollment. Additional stickers are not available. Please do not apply patch if you will be having a Nuclear Stress Test,  Echocardiogram, Cardiac CT, MRI, or Chest Xray during the period you would be wearing the  monitor. The patch cannot be worn during these tests. You cannot remove and re-apply the  ZIO XT patch monitor.  Your ZIO patch monitor will be mailed 3 day USPS to your address on file. It may take 3-5 days  to receive your monitor after you have been enrolled.  Once  you have received your monitor, please review the enclosed instructions. Your monitor  has already been registered assigning a specific monitor serial # to you.  Billing and Patient Assistance Program Information  We have supplied [employer] with any of your insurance information on file for billing purposes. [employer] offers a sliding scale Patient Assistance Program for patients that do not have  insurance, or whose insurance does not completely cover the cost of the ZIO monitor.  You must apply for the Patient Assistance Program to qualify for this discounted rate.  To apply, please call [employer] at [phone], select option 4, select option 2, ask to apply for  Patient Assistance Program. [name] will ask your household income, and how many people  are in your household. They will quote your out-of-pocket cost based on that information.  [employer] will also be able to set up a 68-month, interest-free payment plan if needed.  Applying the monitor   Shave hair from upper left chest.  Hold abrader disc by orange tab. Rub abrader in 40 strokes over the upper  left chest as  indicated in your monitor instructions.  Clean area with 4 enclosed alcohol pads. Let dry.  Apply patch as indicated in monitor instructions. Patch will be placed under collarbone on left  side of chest with arrow pointing upward.  Rub patch adhesive wings for 2 minutes. Remove white label marked "1". Remove the white  label marked "2". Rub patch adhesive wings for 2 additional minutes.  While looking in a mirror, press and release button in center of patch. A small green light will  flash 3-4 times. This will be your only indicator that the monitor has been turned on.  Do not shower for the first 24 hours. You may shower after the first 24 hours.  Press the button if you feel a symptom. You will hear a small click. Record Date, Time and  Symptom in the Patient Logbook.  When you are ready to remove the patch, follow instructions on the last 2 pages of Patient  Logbook. Stick patch monitor onto the last page of Patient Logbook.  Place Patient Logbook in the blue and white box. Use locking tab on box and tape box closed  securely. The blue and white box has prepaid postage on it. Please place it in the mailbox as  soon as possible. Your physician should have your test results approximately 7 days after the  monitor has been mailed back to [facility].  Call [facility] Customer Care at [phone] if you have questions regarding  your ZIO XT patch monitor. Call them immediately if you see an orange light blinking on your  monitor.  If your monitor falls off in less than 4 days, contact our Monitor department at [phone].  If your monitor becomes loose or falls off after 4 days call [employer] at [phone] for  suggestions on securing your monitor    Follow-Up: At [facility], you and your health needs are our priority.  As part of our continuing mission to provide you with exceptional heart care, we have created designated Provider Care Teams.  These  Care Teams include your primary Cardiologist (physician) and Advanced Practice Providers (APPs -  Physician Assistants and Nurse Practitioners) who all work together to provide you with the care you need, when you need it.  We recommend signing up for the patient portal called "MyChart".  Sign up information is provided on this After Visit Summary.  MyChart is used to connect  with patients for Virtual Visits (Telemedicine).  Patients are able to view lab/test results, encounter notes, upcoming appointments, etc.  Non-urgent messages can be sent to your provider as well.   To learn more about what you can do with MyChart, go to [email or web address].    Your next appointment:   KEEP SCHEDULED FOLLOW-UP  Important Information About Sugar         Signed, Georgie Chard, NP  07/07/2022 12:14 PM    [facility]

## 2022-07-07 ENCOUNTER — Ambulatory Visit: Payer: Medicare Other | Attending: Cardiology | Admitting: Cardiology

## 2022-07-07 ENCOUNTER — Ambulatory Visit (INDEPENDENT_AMBULATORY_CARE_PROVIDER_SITE_OTHER): Payer: Medicare Other

## 2022-07-07 VITALS — BP 140/100 | HR 81 | Ht 66.5 in | Wt 177.0 lb

## 2022-07-07 DIAGNOSIS — R0602 Shortness of breath: Secondary | ICD-10-CM

## 2022-07-07 DIAGNOSIS — R002 Palpitations: Secondary | ICD-10-CM | POA: Diagnosis not present

## 2022-07-07 DIAGNOSIS — I10 Essential (primary) hypertension: Secondary | ICD-10-CM

## 2022-07-07 DIAGNOSIS — Z953 Presence of xenogenic heart valve: Secondary | ICD-10-CM

## 2022-07-07 DIAGNOSIS — Z952 Presence of prosthetic heart valve: Secondary | ICD-10-CM | POA: Diagnosis not present

## 2022-07-07 MED ORDER — METOPROLOL TARTRATE 25 MG PO TABS: 25.0000 mg | ORAL_TABLET | Freq: Two times a day (BID) | ORAL | 3 refills | Status: DC

## 2022-07-07 MED ORDER — FUROSEMIDE 20 MG PO TABS: 20.0000 mg | ORAL_TABLET | ORAL | 3 refills | Status: AC | PRN

## 2022-07-07 NOTE — Patient Instructions (Addendum)
Medication Instructions:  Your physician has recommended you make the following change in your medication:  STOP PLAVIX START METOPROLOL 25 MG TWICE DAILY  START LASIX 20 MG AS NEEDED  *If you need a refill on your cardiac medications before your next appointment, please call your pharmacy*   Lab Work: NONE If you have labs (blood work) drawn today and your tests are completely normal, you will receive your results only by: MyChart Message (if you have MyChart) OR A paper copy in the mail If you have any lab test that is abnormal or we need to change your treatment, we will call you to review the results.   Testing/Procedures: Courtney Harvey- Long Term Monitor Instructions  Your physician has requested you wear a ZIO patch monitor for 14 days.  This is a single patch monitor. [employer] supplies one patch monitor per enrollment. Additional stickers are not available. Please do not apply patch if you will be having a Nuclear Stress Test,  Echocardiogram, Cardiac CT, MRI, or Chest Xray during the period you would be wearing the  monitor. The patch cannot be worn during these tests. You cannot remove and re-apply the  ZIO XT patch monitor.  Your ZIO patch monitor will be mailed 3 day USPS to your address on file. It may take 3-5 days  to receive your monitor after you have been enrolled.  Once you have received your monitor, please review the enclosed instructions. Your monitor  has already been registered assigning a specific monitor serial # to you.  Billing and Patient Assistance Program Information  We have supplied [employer] with any of your insurance information on file for billing purposes. [employer] offers a sliding scale Patient Assistance Program for patients that do not have  insurance, or whose insurance does not completely cover the cost of the ZIO monitor.  You must apply for the Patient Assistance Program to qualify for this discounted rate.  To apply, please call [employer] at  [phone], select option 4, select option 2, ask to apply for  Patient Assistance Program. Courtney Harvey will ask your household income, and how many people  are in your household. They will quote your out-of-pocket cost based on that information.  [employer] will also be able to set up a 47-month, interest-free payment plan if needed.  Applying the monitor   Shave hair from upper left chest.  Hold abrader disc by orange tab. Rub abrader in 40 strokes over the upper left chest as  indicated in your monitor instructions.  Clean area with 4 enclosed alcohol pads. Let dry.  Apply patch as indicated in monitor instructions. Patch will be placed under collarbone on left  side of chest with arrow pointing upward.  Rub patch adhesive wings for 2 minutes. Remove white label marked "1". Remove the white  label marked "2". Rub patch adhesive wings for 2 additional minutes.  While looking in a mirror, press and release button in center of patch. A small green light will  flash 3-4 times. This will be your only indicator that the monitor has been turned on.  Do not shower for the first 24 hours. You may shower after the first 24 hours.  Press the button if you feel a symptom. You will hear a small click. Record Date, Time and  Symptom in the Patient Logbook.  When you are ready to remove the patch, follow instructions on the last 2 pages of Patient  Logbook. Stick patch monitor onto the last page of Patient Logbook.  Place Patient Logbook in the blue and white box. Use locking tab on box and tape box closed  securely. The blue and white box has prepaid postage on it. Please place it in the mailbox as  soon as possible. Your physician should have your test results approximately 7 days after the  monitor has been mailed back to [facility].  Call [facility] Customer Care at [phone] if you have questions regarding  your ZIO XT patch monitor. Call them immediately if you see an orange light  blinking on your  monitor.  If your monitor falls off in less than 4 days, contact our Monitor department at [phone].  If your monitor becomes loose or falls off after 4 days call [employer] at [phone] for  suggestions on securing your monitor    Follow-Up: At [facility], you and your health needs are our priority.  As part of our continuing mission to provide you with exceptional heart care, we have created designated Provider Care Teams.  These Care Teams include your primary Cardiologist (physician) and Advanced Practice Providers (APPs -  Physician Assistants and Nurse Practitioners) who all work together to provide you with the care you need, when you need it.  We recommend signing up for the patient portal called "MyChart".  Sign up information is provided on this After Visit Summary.  MyChart is used to connect with patients for Virtual Visits (Telemedicine).  Patients are able to view lab/test results, encounter notes, upcoming appointments, etc.  Non-urgent messages can be sent to your provider as well.   To learn more about what you can do with MyChart, go to [email or web address].    Your next appointment:   KEEP SCHEDULED FOLLOW-UP  Important Information About Sugar

## 2022-07-07 NOTE — Progress Notes (Unsigned)
Enrolled for [employer] to mail a ZIO XT long term holter monitor to the patients address on file.   Dr. Sam McDowell to read. 

## 2022-08-03 DIAGNOSIS — R002 Palpitations: Secondary | ICD-10-CM | POA: Diagnosis not present

## 2022-12-03 ENCOUNTER — Ambulatory Visit: Payer: Medicare Other

## 2022-12-03 ENCOUNTER — Other Ambulatory Visit (HOSPITAL_COMMUNITY): Payer: Medicare Other

## 2022-12-10 ENCOUNTER — Ambulatory Visit: Payer: Medicare Other | Admitting: Cardiology

## 2022-12-10 ENCOUNTER — Encounter (HOSPITAL_COMMUNITY): Payer: Self-pay

## 2022-12-10 ENCOUNTER — Ambulatory Visit: Payer: Medicare Other | Attending: Cardiology

## 2022-12-10 VITALS — BP 130/88 | HR 54 | Ht 66.5 in | Wt 177.4 lb

## 2022-12-10 DIAGNOSIS — Z952 Presence of prosthetic heart valve: Secondary | ICD-10-CM | POA: Diagnosis not present

## 2022-12-10 DIAGNOSIS — I7121 Aneurysm of the ascending aorta, without rupture: Secondary | ICD-10-CM

## 2022-12-10 DIAGNOSIS — Z953 Presence of xenogenic heart valve: Secondary | ICD-10-CM | POA: Diagnosis not present

## 2022-12-10 DIAGNOSIS — R002 Palpitations: Secondary | ICD-10-CM

## 2022-12-10 DIAGNOSIS — T82857D Stenosis of cardiac prosthetic devices, implants and grafts, subsequent encounter: Secondary | ICD-10-CM

## 2022-12-10 DIAGNOSIS — I10 Essential (primary) hypertension: Secondary | ICD-10-CM

## 2022-12-10 LAB — ECHOCARDIOGRAM COMPLETE
AR max vel: 1.34 cm2
AV Area VTI: 1.4 cm2
AV Area mean vel: 1.2 cm2
AV Mean grad: 17 mmHg
AV Peak grad: 29.4 mmHg
Ao pk vel: 2.71 m/s
Area-P 1/2: 2.5 cm2
Calc EF: 65.1 %
S' Lateral: 2.55 cm
Single Plane A2C EF: 67.2 %
Single Plane A4C EF: 61.6 %

## 2022-12-10 NOTE — Patient Instructions (Addendum)
Medication Instructions:  Your physician recommends that you continue on your current medications as directed. Please refer to the Current Medication list given to you today.  *If you need a refill on your cardiac medications before your next appointment, please call your pharmacy*   Lab Work: NONE If you have labs (blood work) drawn today and your tests are completely normal, you will receive your results only by: [place] (if you have MyChart) OR A paper copy in the mail If you have any lab test that is abnormal or we need to change your treatment, we will call you to review the results.   Testing/Procedures: NONE   Follow-Up: At [facility], you and your health needs are our priority.  As part of our continuing mission to provide you with exceptional heart care, we have created designated Provider Care Teams.  These Care Teams include your primary Cardiologist (physician) and Advanced Practice Providers (APPs -  Physician Assistants and Nurse Practitioners) who all work together to provide you with the care you need, when you need it.  We recommend signing up for the patient portal called "MyChart".  Sign up information is provided on this After Visit Summary.  MyChart is used to connect with patients for Virtual Visits (Telemedicine).  Patients are able to view lab/test results, encounter notes, upcoming appointments, etc.  Non-urgent messages can be sent to your provider as well.   To learn more about what you can do with MyChart, go to [email or web address].    Your next appointment:   APRIL 2024  Provider:   You may see Rozann Lesches, MD or the following Advanced Practice Provider on your designated Care Team:   Finis Bud, NP

## 2022-12-10 NOTE — Progress Notes (Signed)
HEART AND [place]                                     Cardiology Office Note:    Date:  12/10/2022   ID:  JENEVIE RUTHVEN, DOB [id number], MRN [id number]  PCP:  Leeanne Rio, MD  [facility] Cardiologist:  Rozann Lesches, MD  [facility] Electrophysiologist:  None   Referring MD: Leeanne Rio, MD   Chief Complaint  Patient presents with   Follow-up    1 year s/p ViV TAVR   History of Present Illness:    Courtney Harvey is a 67 y.o. 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    Echocardiogram from 09/11/2021 showed a mean gradient of 61 mmHg with a peak gradient of 104.2 mmHg and a valve area by VTI of 0.83 cm. Left ventricular ejection fraction was 70 to 75%. TEE 09/30/2021 showed severe prosthetic aortic valve stenosis. Valve area by planimetry was 0.63 cm with a mean gradient of 55 mmHg. Peak gradient was 95 mmHg. Dimensionless index of 0.2. Aortic regurgitation was trivial. There was severe thickening and calcification of the leaflets along the commissures with severely restricted leaflet motion. Ejection fraction was 60 to 65%. Cardiac catheterization on 10/02/2021 showed patent coronary arteries with minimal luminal irregularities. Right heart pressures were normal.   She presented to see Dr. Cyndia Bent 11/20/21 after being referred for progressive dyspnea with exertion, chest discomfort, and exertional fatigue. She was not felt to be a surgical candidate given Jehovah Witness status.    She underwent successful valve in valve TAVR with a 23 mm Medtronic Evolut Pro-Plus via the TF approach on 12/01/21. Post operative echo showed EF 60-65%, normally functioning TAVR with a mean gradient of 28 mmHg  and trivial PVL. She was discharged on aspirin and Plavix x6 months given ViV TAVR.    In follow up, she was doing well with echo showing stable 23 mm CoreValve-Evolut Pro prosthetic (TAVR) valve present in the aortic position, mean gradient 19 mmHg, DI 0.45, and no AI compared to 12/02/21. LVEF at 60 to 65% with Grade I diastolic dysfunction (impaired relaxation).   Today she is here alone and continues to do well from a CV standpoint. She cares for her grandchildren and has picked up a cold here and there from them but overall has been doing good. Energy level has remained up and she has no SOB or chest pain. LE edema has stabilized and she only uses Lasix about once per week if that. Echo today showes normal LV function with a mean gradient at 21mHg and AVA at 1.40cm2 which appears to be close to prior echo from one month visit.    Past Medical History:  Diagnosis Date   Bicuspid aortic valve 2005   Chronic diastolic heart failure [place]    Complication of anesthesia 2020   pt states daughter told her that she was told she "stopped breathing for a couple of seconds" during colonoscopy in 2020 and was hard to wake up but no interventions were done per pt   DM2 (diabetes mellitus, type 2) ([place] 12/01/2021   Essential  hypertension    Glaucoma    H/O hiatal hernia    Recurrent upper respiratory infection (URI)    S/P aortic valve replacement 01/26/2012   [facility] pericardial tissue valve   S/P patent foramen ovale closure 01/26/2012   PFO found on TEE prior to AVR. 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 CoBurt Knacknd Dr. BaCyndia Bent Type 2 diabetes mellitus ([facility]    Past Surgical History:  Procedure Laterality Date   AORTIC VALVE REPLACEMENT  01/26/2012   Procedure: AORTIC VALVE REPLACEMENT (AVR);  Surgeon: ClRexene AlbertsMD;  [place] [facility]  Service: Open Heart Surgery;  Laterality: N/A;   CARDIAC CATHETERIZATION  2005   Friday March 8th       [place] 09/06/2018   Procedure: DILATATION AND CURETTAGE /HYSTEROSCOPY  ENDOMETRIAL POLYPECTOMY;  Surgeon: EuFlorian BuffMD;  Location: [facility];  Service: Gynecology;  Laterality: N/A;   INTRAOPERATIVE TRANSTHORACIC ECHOCARDIOGRAM N/A 12/01/2021   Procedure: INTRAOPERATIVE TRANSTHORACIC ECHOCARDIOGRAM;  Surgeon: CoSherren MochaMD;  [place];  Service: Open Heart Surgery;  Laterality: N/A;   LEFT AND RIGHT HEART CATHETERIZATION WITH CORONARY ANGIOGRAM N/A 01/07/2012   Procedure: LEFT AND RIGHT HEART CATHETERIZATION WITH CORONARY ANGIOGRAM;  Surgeon: PeJosue HectorMD;  [facility];  Service: Cardiovascular;  Laterality: N/A;   RIGHT HEART CATH AND CORONARY ANGIOGRAPHY N/A 10/02/2021   Procedure: RIGHT HEART CATH AND CORONARY ANGIOGRAPHY;  Surgeon: CoSherren MochaMD;  [place] [facility];  Service: Cardiovascular;  Laterality: N/A;   TEE WITHOUT CARDIOVERSION  01/10/2012   Procedure: TRANSESOPHAGEAL ECHOCARDIOGRAM (TEE);  Surgeon: PaFay RecordsMD;  [facility];  Service: Cardiovascular;  Laterality: N/A;  TEE call Trish in am for time   TEE WITHOUT CARDIOVERSION N/A 09/30/2021   Procedure: TRANSESOPHAGEAL ECHOCARDIOGRAM (TEE) WITH 3D IMAGING;  Surgeon: PeFreada BergeronMD;  [place] [facility] Service: Cardiovascular;  Laterality: N/A;   TRANSCATHETER AORTIC VALVE REPLACEMENT, TRANSFEMORAL N/A 12/01/2021   Procedure: TRANSCATHETER AORTIC VALVE REPLACEMENT, TRANSFEMORAL;  Surgeon: CoSherren MochaMD;  [place];  Service: Open Heart Surgery;  Laterality: N/A;    Current Medications: Current Meds  Medication Sig   acetaminophen (TYLENOL) 500 MG tablet Take 500-1,000 mg by mouth every 6 (six) hours as needed for moderate pain.   amoxicillin (AMOXIL) 500 MG tablet  Take 4 tablets by mouth 1 hour prior to dental procedures and cleanings   aspirin EC 81 MG tablet Take 81 mg by mouth daily. Swallow whole.   cetirizine (ZYRTEC) 10 MG tablet Take 10 mg by mouth in the morning.   fluticasone (FLONASE) 50 MCG/ACT nasal spray Place 1 spray into both nostrils daily as needed for allergies or rhinitis.   furosemide (LASIX) 20 MG tablet Take 1 tablet (20 mg total) by mouth as needed for fluid or edema.   metFORMIN (GLUCOPHAGE-XR) 500 MG 24 hr tablet Take 500 mg by mouth daily before breakfast.   metoprolol tartrate (LOPRESSOR) 25 MG tablet Take 1 tablet (25 mg total) by mouth 2 (two) times daily.   nicotine polacrilex (NICORETTE) 2 MG gum Take 2 mg by mouth as needed for smoking cessation.   Current Facility-Administered Medications for the  12/10/22 encounter (Office Visit) with [facility]  Medication   sodium chloride flush (NS) 0.9 % injection 3 mL     Allergies:   Peanut-containing drug products and Wellbutrin [bupropion hcl]   Social History   Socioeconomic History   Marital status: Widowed    Spouse name: Not on file   Number of children: 2   Years of education: Not on file   Highest education level: Not on file  Occupational History    Employer: [employer]  Tobacco Use   Smoking status: Former    Packs/day: 1.00    Years: 40.00    Total pack years: 40.00    Types: Cigarettes    Quit date: 01/06/2012    Years since quitting: 10.9   Smokeless tobacco: Never  Vaping Use   Vaping Use: Never used  Substance and Sexual Activity   Alcohol use: No    Alcohol/week: 0.0 standard drinks of alcohol   Drug use: No   Sexual activity: Not Currently    Birth control/protection: Post-menopausal  Other Topics Concern   Not on file  Social History Narrative   She is from [place], [place]. She is widowed. Her husband committed suicide sometime last year, but she feels that she has dealt with it. She currently lives alone and works at  [employer].    Social Determinants of Health   Financial Resource Strain: Not on file  Food Insecurity: Not on file  Transportation Needs: Not on file  Physical Activity: Not on file  Stress: Not on file  Social Connections: Not on file     Family History: The patient's family history includes Cancer in her sister; Hypertension in her brother and son; Lung cancer in her father; Uterine cancer in her mother.  ROS:   Please see the history of present illness.    All other systems reviewed and are negative.  EKGs/Labs/Other Studies Reviewed:    The following studies were reviewed today:  Echocardiogram 12/10/22:   1. No significant change compared to 01/01/22.   2. Left ventricular ejection fraction, by estimation, is 60 to 65%. The  left ventricle has normal function. The left ventricle has no regional  wall motion abnormalities. Left ventricular diastolic parameters are  consistent with Grade I diastolic  dysfunction (impaired relaxation).   3. Right ventricular systolic function is normal. The right ventricular  size is normal. There is normal pulmonary artery systolic pressure.   4. The mitral valve is normal in structure. Mild mitral valve  regurgitation. No evidence of mitral stenosis.   5. The aortic valve has been repaired/replaced. Aortic valve  regurgitation is not visualized. No aortic stenosis is present. 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 Procedure Date: 12/01/2021.   6. Aortic dilatation noted. There is mild dilatation of the ascending  aorta, measuring 43 mm.   7. The inferior vena cava is normal in size with greater than 50%  respiratory variability, suggesting right atrial pressure of 3 mmHg.    Echocardiogram 01/01/22:   1. S/P valve in valve TAVR; mean gradient 19 mmHg; DI 0.45; no AI;  compared to 12/02/21, no AI is present on this study and aortic valve mean  gradient has decreased.   2.  Left ventricular ejection fraction, by estimation, is 60 to 65%. The  left ventricle has normal function. The left ventricle has no regional  wall motion abnormalities. There is mild left ventricular hypertrophy.  Left ventricular diastolic parameters  are consistent with Grade I diastolic dysfunction (impaired relaxation).  Elevated left atrial pressure.   3. Right ventricular systolic function is normal. The right ventricular  size is normal.   4. The mitral valve is normal in structure. Trivial mitral valve  regurgitation. No evidence of mitral stenosis.   5. The aortic valve has been repaired/replaced. Aortic valve  regurgitation is not visualized. No aortic stenosis is present. 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 Procedure Date: 12/01/21.   6. Aortic dilatation noted. There is mild dilatation of the aortic root,  measuring 43 mm.   7. The inferior vena cava is normal in size with greater than 50%  respiratory variability, suggesting right atrial pressure of 3 mmHg.      TAVR OPERATIVE NOTE     Date of Procedure:                12/01/2021    Preoperative Diagnosis:      Severe Aortic Stenosis    Postoperative Diagnosis:    Same    Procedure:        Transcatheter Aortic Valve Replacement (Valve-In-Valve) - Percutaneous  Transfemoral Approach             Medtronic Evolut Pro-Plus (size 23 mm, serial # [id number])              Co-Surgeons:                        Gaye Pollack, MD and Sherren Mocha, MD   Anesthesiologist:                  Adele Barthel, MD   Echocardiographer:              Jenkins Rouge, MD   Pre-operative Echo Findings: Severe bioprosthetic aortic stenosis Normal left ventricular systolic function   Post-operative Echo Findings: No paravalvular leak Normal left ventricular systolic function with mean transvalvular gradient 11 mmHg _____________   Echo 12/02/21: IMPRESSIONS  1. Left ventricular  ejection fraction, by estimation, is 60 to 65%. The  left ventricle has normal function. The left ventricle has no regional  wall motion abnormalities. There is mild concentric left ventricular  hypertrophy. Left ventricular diastolic  parameters are consistent with Grade III diastolic dysfunction  (restrictive).   2. Right ventricular systolic function is normal. The right ventricular  size is normal. There is normal pulmonary artery systolic pressure.   3. The mitral valve is abnormal. No evidence of mitral valve  regurgitation. No evidence of mitral stenosis.   4. Well seated bioprosthetic aortic valve. The aortic valve has been  repaired/replaced. Central aortic valve regurgitation is not visualized.  There is very trivial paravalvular leak in the 12oclock position in the  psax view. No aortic stenosis is  present. There is a 23 mm Evolut Pro+ 22% oversized valve present in the  aortic position. Procedure Date: 12/01/21.   5. Aneurysm of the ascending aorta, measuring 40 mm.   6. The inferior vena cava is normal in size with greater than 50%  respiratory variability, suggesting right atrial pressure of 3 mmHg.    EKG:  EKG is not ordered today.    Recent Labs: No results found for requested labs within last 365 days.  Recent Lipid Panel No results found for: "CHOL", "TRIG", "HDL", "CHOLHDL", "VLDL", "LDLCALC", "LDLDIRECT"  Physical Exam:  VS:  BP 130/88   Pulse (!) 54   Ht 5' 6.5" (1.689 m)   Wt 177 lb 6.4 oz (80.5 kg)   LMP  (LMP Unknown)   SpO2 97%   BMI 28.20 kg/m     Wt Readings from Last 3 Encounters:  12/10/22 177 lb 6.4 oz (80.5 kg)  07/07/22 177 lb (80.3 kg)  02/04/22 177 lb 12.8 oz (80.6 kg)    General: Well developed, well nourished, NAD Lungs:Clear to ausculation bilaterally. No wheezes, rales, or rhonchi. Breathing is unlabored. Cardiovascular: RRR with S1 S2. + murmur Extremities: No edema.  Neuro: Alert and oriented. No focal deficits. No facial  asymmetry. MAE spontaneously. Psych: Responds to questions appropriately with normal affect.    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 Echo today with normal LV function with a mean gradient at 93mHg and AVA at 1.40cm2 which appears to be close to prior echo from one month visit. Continue ASA indefinitely. Lifelong SBE with Amocicillin for dental cleanings and procedures. Continue regular follow up with Dr. MDomenic Polite    HTN: Stable today with no changes needed at this time.   Palpitations: Resolved with no reports today. Prior ZIO with rare NSVT. Continue beta blocker.   TAA: pre TAVR CTA showed a 4.6 cm fusiform ascending aortic aneurysm. Will require semi annual follow up.   Medication Adjustments/Labs and Tests Ordered: Current medicines are reviewed at length with the patient today.  Concerns regarding medicines are outlined above.  No orders of the defined types were placed in this encounter.  No orders of the defined types were placed in this encounter.   Patient Instructions  Medication Instructions:  Your physician recommends that you continue on your current medications as directed. Please refer to the Current Medication list given to you today.  *If you need a refill on your cardiac medications before your next appointment, please call your pharmacy*   Lab Work: NONE If you have labs (blood work) drawn today and your tests are completely normal, you will receive your results only by: [place](if you have MyChart) OR A paper copy in the mail If you have any lab test that is abnormal or we need to change your treatment, we will call you to review the results.   Testing/Procedures: NONE   Follow-Up: [facility] and your [facility] needs [facility] our [facility].  As part of our continuing mission to provide you with exceptional heart care, we have created  designated Provider Care Teams.  These Care Teams include your primary Cardiologist (physician) and Advanced Practice Providers (APPs -  Physician Assistants and Nurse Practitioners) who all work together to provide you with the care you need, when you need it.  We recommend signing up for the patient portal called "MyChart".  Sign up information is provided on this After Visit Summary.  MyChart is used to connect with patients for Virtual Visits (Telemedicine).  Patients are able to view lab/test results, encounter notes, upcoming appointments, etc.  Non-urgent messages can be sent to your provider as well.   To learn more about what you can do with MyChart, go to [email or web address]    Your next appointment:   APRIL 2024  Provider:   You may see SRozann Lesches MD or the following Advanced Practice Provider on your designated Care Team:   EFinis Bud NP    Signed, JSharee Pimple  Glenford Peers, NP  12/10/2022 1:51 PM    [facility]

## 2023-03-02 NOTE — Progress Notes (Signed)
Cardiology Office Note  Date: 03/03/2023   ID: [name], DOB Mar 08, 1956, MRN [id number]  History of Present Illness: Courtney Harvey is a 67 y.o. female last seen by Ms. Courtney Girt NP in February, I reviewed the note.  She is here for a routine visit.  Reports no angina, NYHA class II dyspnea, no palpitations or syncope.  She has been busy babysitting her granddaughter recently.  I reviewed her medications, she remains on aspirin, Lopressor, also uses Lasix intermittently for ankle edema.  No substantial weight gain, no orthopnea or PND.  She may be transitioning PCP, previously saw Dr. Wyline Harvey prior to her departure from practice in [place].  Physical Exam: VS:  BP 130/86   Pulse 60   Ht 5' 6.5" (1.689 m)   Wt 179 lb 12.8 oz (81.6 kg)   LMP  (LMP Unknown)   SpO2 99%   BMI 28.59 kg/m , BMI Body mass index is 28.59 kg/m.  Wt Readings from Last 3 Encounters:  03/03/23 179 lb 12.8 oz (81.6 kg)  12/10/22 177 lb 6.4 oz (80.5 kg)  07/07/22 177 lb (80.3 kg)    General: Patient appears comfortable at rest. HEENT: Conjunctiva and lids normal. Neck: Supple, no elevated JVP or carotid bruits. Lungs: Clear to auscultation, nonlabored breathing at rest. Cardiac: Regular rate and rhythm, no S3, 3/6 systolic murmur. Extremities: No pitting edema.  ECG:  An ECG dated 07/07/2022 was personally reviewed today and demonstrated:  Sinus rhythm.  Labwork:  July 2023: Hemoglobin 10.8, platelets 229, BUN 16, creatinine 0.98, potassium 4.2, AST 14, ALT 12, cholesterol 147, triglycerides 183, HDL 37, LDL 79, hemoglobin A1c 6.2%  Other Studies Reviewed Today:  Echocardiogram 12/10/2022:  1. No significant change compared to 01/01/22.   2. Left ventricular ejection fraction, by estimation, is 60 to 65%. The  left ventricle has normal function. The left ventricle has no regional  wall motion abnormalities. Left ventricular diastolic parameters are  consistent with Grade I diastolic  dysfunction  (impaired relaxation).   3. Right ventricular systolic function is normal. The right ventricular  size is normal. There is normal pulmonary artery systolic pressure.   4. The mitral valve is normal in structure. Mild mitral valve  regurgitation. No evidence of mitral stenosis.   5. The aortic valve has been repaired/replaced. Aortic valve  regurgitation is not visualized. No aortic stenosis is present. 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 Procedure Date: 12/01/2021.   6. Aortic dilatation noted. There is mild dilatation of the ascending  aorta, measuring 43 mm.   7. The inferior vena cava is normal in size with greater than 50%  respiratory variability, suggesting right atrial pressure of 3 mmHg.   Assessment and Plan:  1.  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  Echocardiogram in February of this year revealed LVEF 60 to 65% with stable TAVR function, mean AV gradient 17 mmHg and no paravalvular regurgitation.  She is clinically stable with NYHA class II dyspnea.  Continue aspirin.  Will obtain a follow-up echocardiogram in February of next year with clinical reevaluation.  2.  History of PFO status postsurgical closure in 2013.  No obvious residual shunting by echocardiogram in February.  3.  Essential hypertension.  Continue Lopressor,  systolic is in the 130s today.  Keep follow-up with PCP.  Disposition:  Follow up  February 2025.  Signed, Jonelle Sidle, M.D., F.A.C.C. [facility]

## 2023-03-03 ENCOUNTER — Encounter: Payer: Self-pay | Admitting: Cardiology

## 2023-03-03 ENCOUNTER — Ambulatory Visit: Payer: Medicare Other | Attending: Cardiology | Admitting: Cardiology

## 2023-03-03 VITALS — BP 130/86 | HR 60 | Ht 66.5 in | Wt 179.8 lb

## 2023-03-03 DIAGNOSIS — I10 Essential (primary) hypertension: Secondary | ICD-10-CM | POA: Diagnosis not present

## 2023-03-03 DIAGNOSIS — Z952 Presence of prosthetic heart valve: Secondary | ICD-10-CM

## 2023-03-03 NOTE — Patient Instructions (Signed)
Medication Instructions:  Your physician recommends that you continue on your current medications as directed. Please refer to the Current Medication list given to you today.  *If you need a refill on your cardiac medications before your next appointment, please call your pharmacy*   Lab Work: NONE   If you have labs (blood work) drawn today and your tests are completely normal, you will receive your results only by: MyChart Message (if you have MyChart) OR A paper copy in the mail If you have any lab test that is abnormal or we need to change your treatment, we will call you to review the results.   Testing/Procedures: Your physician has requested that you have an echocardiogram. Echocardiography is a painless test that uses sound waves to create images of your heart. It provides your doctor with information about the size and shape of your heart and how well your heart's chambers and valves are working. This procedure takes approximately one hour. There are no restrictions for this procedure. Please do NOT wear cologne, perfume, aftershave, or lotions (deodorant is allowed). Please arrive 15 minutes prior to your appointment time.    Follow-Up: At [facility], you and your health needs are our priority.  As part of our continuing mission to provide you with exceptional heart care, we have created designated Provider Care Teams.  These Care Teams include your primary Cardiologist (physician) and Advanced Practice Providers (APPs -  Physician Assistants and Nurse Practitioners) who all work together to provide you with the care you need, when you need it.  We recommend signing up for the patient portal called "MyChart".  Sign up information is provided on this After Visit Summary.  MyChart is used to connect with patients for Virtual Visits (Telemedicine).  Patients are able to view lab/test results, encounter notes, upcoming appointments, etc.  Non-urgent messages can be sent to  your provider as well.   To learn more about what you can do with MyChart, go to [email or web address].    Your next appointment:    February 2025   Provider:   Nona Dell, MD    Other Instructions Thank you for choosing [facility]!

## 2023-03-10 DIAGNOSIS — R42 Dizziness and giddiness: Secondary | ICD-10-CM | POA: Diagnosis not present

## 2023-06-02 DIAGNOSIS — L219 Seborrheic dermatitis, unspecified: Secondary | ICD-10-CM | POA: Diagnosis not present

## 2023-06-02 DIAGNOSIS — Z0000 Encounter for general adult medical examination without abnormal findings: Secondary | ICD-10-CM | POA: Diagnosis not present

## 2023-06-02 DIAGNOSIS — Z1231 Encounter for screening mammogram for malignant neoplasm of breast: Secondary | ICD-10-CM | POA: Diagnosis not present

## 2023-06-02 DIAGNOSIS — J438 Other emphysema: Secondary | ICD-10-CM | POA: Diagnosis not present

## 2023-06-02 DIAGNOSIS — Z13220 Encounter for screening for lipoid disorders: Secondary | ICD-10-CM | POA: Diagnosis not present

## 2023-06-02 DIAGNOSIS — J302 Other seasonal allergic rhinitis: Secondary | ICD-10-CM | POA: Diagnosis not present

## 2023-06-02 DIAGNOSIS — R7302 Impaired glucose tolerance (oral): Secondary | ICD-10-CM | POA: Diagnosis not present

## 2023-06-02 DIAGNOSIS — Z136 Encounter for screening for cardiovascular disorders: Secondary | ICD-10-CM | POA: Diagnosis not present

## 2023-06-02 DIAGNOSIS — Z87891 Personal history of nicotine dependence: Secondary | ICD-10-CM | POA: Diagnosis not present

## 2023-06-16 DIAGNOSIS — I700 Atherosclerosis of aorta: Secondary | ICD-10-CM | POA: Diagnosis not present

## 2023-06-16 DIAGNOSIS — I2510 Atherosclerotic heart disease of native coronary artery without angina pectoris: Secondary | ICD-10-CM | POA: Diagnosis not present

## 2023-06-16 DIAGNOSIS — R918 Other nonspecific abnormal finding of lung field: Secondary | ICD-10-CM | POA: Diagnosis not present

## 2023-06-16 DIAGNOSIS — Z87891 Personal history of nicotine dependence: Secondary | ICD-10-CM | POA: Diagnosis not present

## 2023-06-16 DIAGNOSIS — J438 Other emphysema: Secondary | ICD-10-CM | POA: Diagnosis not present

## 2023-06-16 DIAGNOSIS — I77810 Thoracic aortic ectasia: Secondary | ICD-10-CM | POA: Diagnosis not present

## 2023-06-16 DIAGNOSIS — I7789 Other specified disorders of arteries and arterioles: Secondary | ICD-10-CM | POA: Diagnosis not present

## 2023-06-16 DIAGNOSIS — Z122 Encounter for screening for malignant neoplasm of respiratory organs: Secondary | ICD-10-CM | POA: Diagnosis not present

## 2023-07-20 ENCOUNTER — Encounter: Payer: Self-pay | Admitting: Family Medicine

## 2023-07-26 ENCOUNTER — Telehealth: Payer: Self-pay | Admitting: Cardiology

## 2023-07-26 NOTE — Telephone Encounter (Signed)
Patient informed and verbalized understanding of plan. 

## 2023-07-26 NOTE — Telephone Encounter (Signed)
Patient called stating an PCP found an aneurysm in her chest. Her PCP is having she a thoracic surgeon, she wants to know if she needs to come in and see Dr. Diona Browner before she sees the thoracic surgeon.  Please advise.

## 2023-07-26 NOTE — Telephone Encounter (Signed)
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 Severe ectasia of the isthmus of the thoracic aorta also noted (4.1 cm in diameter)". Advised this isn't a new finding since 10/2021 CT Angio Chest & Aorta by Dr. 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 Says she was unaware of this. Advised that message would be sent to provider for response of seeing a thoracic surgeon. Advised she more than likely wouldn't need to see cardiology prior to the referral. Verbalized understanding.

## 2023-08-04 ENCOUNTER — Institutional Professional Consult (permissible substitution): Payer: Medicare Other | Admitting: Physician Assistant

## 2023-08-04 ENCOUNTER — Encounter: Payer: Self-pay | Admitting: Physician Assistant

## 2023-08-04 VITALS — BP 122/75 | HR 66 | Resp 20 | Ht 66.5 in | Wt 178.0 lb

## 2023-08-04 DIAGNOSIS — I7121 Aneurysm of the ascending aorta, without rupture: Secondary | ICD-10-CM | POA: Diagnosis not present

## 2023-08-04 NOTE — Patient Instructions (Signed)
Risk Modification for ascending thoracic aortic aneurysm:  Continue good control of blood pressure (prefer SBP 130/80 or less)-on metoprolol  2. Avoid fluoroquinolone antibiotics (I.e Ciprofloxacin, Avelox, Levofloxacin, Ofloxacin)  3.  Use of statin is recommended (to decrease cardiovascular risk)  4.  Exercise and activity limitations is individualized, but in general, contact sports are to be avoided and one should avoid heavy lifting (defined as half of ideal body weight) and avoid exercises involving sustained Valsalva maneuver.

## 2023-08-04 NOTE — Progress Notes (Signed)
PCP is Lindaann Slough, DO Referring Provider is Lindaann Slough, DO  Reason for consult:Surveillance of 4.6 cm thoracic aortic aneurysm   HPI: [name] is a 67 year old female Jehovah's Witness known to our practice with prior history of severe aortic stenosis associated with a bicuspid aortic valve, hypertension, type 2 diabetes mellitus, and prior 40 pack-year history of cigarette smoking.  She is status post aortic valve replacement and closure of a PFO by Dr. Cornelius Moras in 2013.  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 During the course of being evaluated by cardiology for these symptoms, she was also discovered to have a 4.6cm fusiform thoracic aortic aneurysm on chest CTA.  Given her personal conviction to accept no blood products, it was decided that she would not be a candidate for re-do aortic valve replacement and repair of the thoracic aneurysm when Dr. Laneta Simmers saw her in 2022.  She went on to have valve-in-valve TAVR procedure in January 2023 by Dr. Laneta Simmers and Dr. Excell Seltzer with a good result.   Her most recent imaging was an CTA done in August of this year by a [facility] showing a 4.7 cm thoracic aortic aneurysm.  The report of the study is attached below but I am not able to review the images.  Ms. [name] has been referred back to [place] for surveillance of the thoracic aneurysm.  She denies any chest pain, shortness of breath, or neurologic symptoms.   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 Excell Seltzer and Dr. Laneta Simmers   Type 2 diabetes mellitus [facility])     Past Surgical History:  Procedure Laterality Date   AORTIC VALVE REPLACEMENT  01/26/2012   Procedure: AORTIC VALVE REPLACEMENT (AVR);  Surgeon: Purcell Nails, MD;  Location: [facility] OR;  Service: Open Heart Surgery;  Laterality: N/A;   CARDIAC CATHETERIZATION  2005   Friday March 8th       pcp Dr tapper  in [place]        HYSTEROSCOPY WITH D & C N/A 09/06/2018   Procedure: DILATATION AND CURETTAGE /HYSTEROSCOPY  ENDOMETRIAL POLYPECTOMY;  Surgeon: Lazaro Arms, MD;  Location: [facility] ORS;  Service: Gynecology;  Laterality: N/A;   INTRAOPERATIVE TRANSTHORACIC ECHOCARDIOGRAM N/A 12/01/2021   Procedure: INTRAOPERATIVE TRANSTHORACIC ECHOCARDIOGRAM;  Surgeon: [name], MD;  Location: [facility] INVASIVE CV LAB;  Service: Open Heart Surgery;  Laterality: N/A;   LEFT AND RIGHT HEART CATHETERIZATION WITH CORONARY ANGIOGRAM N/A 01/07/2012   Procedure: LEFT AND RIGHT HEART CATHETERIZATION WITH CORONARY ANGIOGRAM;  Surgeon: Wendall Stade,  MD;  Location: [facility] CATH LAB;  Service: Cardiovascular;  Laterality: N/A;   RIGHT HEART CATH AND CORONARY ANGIOGRAPHY N/A 10/02/2021   Procedure: RIGHT HEART CATH AND CORONARY ANGIOGRAPHY;  Surgeon: [name], MD;  Location: [facility] INVASIVE CV LAB;  Service: Cardiovascular;  Laterality: N/A;   TEE WITHOUT CARDIOVERSION  01/10/2012   Procedure: TRANSESOPHAGEAL ECHOCARDIOGRAM (TEE);  Surgeon: Pricilla Riffle, MD;  Location: [facility] ENDOSCOPY;  Service: Cardiovascular;  Laterality: N/A;  TEE call Trish in am for time   TEE WITHOUT  CARDIOVERSION N/A 09/30/2021   Procedure: TRANSESOPHAGEAL ECHOCARDIOGRAM (TEE) WITH 3D IMAGING;  Surgeon: [name], MD;  Location: [facility] ENDOSCOPY;  Service: Cardiovascular;  Laterality: N/A;   TRANSCATHETER AORTIC VALVE REPLACEMENT, TRANSFEMORAL N/A 12/01/2021   Procedure: TRANSCATHETER AORTIC VALVE REPLACEMENT, TRANSFEMORAL;  Surgeon: [name], MD;  Location: [facility] INVASIVE CV LAB;  Service: Open Heart Surgery;  Laterality: N/A;    Family History  Problem Relation Age of Onset   Lung cancer Father    Uterine cancer Mother    Hypertension Brother    Cancer Sister    Hypertension Son     Social History Social History   Tobacco Use   Smoking status: Former    Current packs/day: 0.00    Average packs/day: 1 pack/day for 40.0 years (40.0 ttl pk-yrs)    Types: Cigarettes    Start date: 01/06/1972    Quit date: 01/06/2012    Years since quitting: 11.5   Smokeless tobacco: Never  Vaping Use   Vaping status: Never Used  Substance Use Topics   Alcohol use: No    Alcohol/week: 0.0 standard drinks of alcohol   Drug use: No    Current Outpatient Medications  Medication Sig Dispense Refill   acetaminophen (TYLENOL) 500 MG tablet Take 500-1,000 mg by mouth every 6 (six) hours as needed for moderate pain.     amoxicillin (AMOXIL) 500 MG tablet Take 4 tablets by mouth 1 hour prior to dental procedures and cleanings 12 tablet 6   aspirin EC 81 MG tablet Take 81 mg by mouth daily. Swallow whole.     cetirizine (ZYRTEC) 10 MG tablet Take 10 mg by mouth in the morning.     fluticasone (FLONASE) 50 MCG/ACT nasal spray Place 1 spray into both nostrils daily as needed for allergies or rhinitis.     furosemide (LASIX) 20 MG tablet Take 1 tablet (20 mg total) by mouth as needed for fluid or edema. 90 tablet 3   metFORMIN (GLUCOPHAGE-XR) 500 MG 24 hr tablet Take 500 mg by mouth daily before breakfast.     metoprolol tartrate (LOPRESSOR) 25 MG tablet Take 1 tablet (25 mg total) by mouth 2  (two) times daily. 180 tablet 3   nicotine polacrilex (NICORETTE) 2 MG gum Take 2 mg by mouth as needed for smoking cessation.     Current Facility-Administered Medications  Medication Dose Route Frequency Provider Last Rate Last Admin   sodium chloride flush (NS) 0.9 % injection 3 mL  3 mL Intravenous Q12H Jonelle Sidle, MD        Allergies  Allergen Reactions   Peanut-Containing Drug Products Other (See Comments)    headache   Wellbutrin [Bupropion Hcl] Itching    Review of Systems: Review of Systems  Constitutional: Negative.   HENT:         Blurry vision, floaters  Respiratory: Negative.    Cardiovascular:        Status post surgical aortic valve replacement in  2013 followed by valve in valve TAVR in 2023  Gastrointestinal:        History of constipation and hiatal hernia  Genitourinary: Negative.   Musculoskeletal: Negative.   Skin: Negative.   Neurological: Negative.   Endo/Heme/Allergies:        Type 2 diabetes managed with metformin  Psychiatric/Behavioral: Negative.        Physical Exam: Vital signs BP 122/75  Pulse 66 Respirations 20 SpO2 95%  General: Pleasant well-developed 67 year old female in no distress.  Skin is warm and dry Heart: Regular rate and rhythm.  There is a grade 3/6 systolic murmur consistent with her aortic valve procedures. Chest: Symmetrical, breath sounds are full, equal, and clear to auscultation. Extremities: No deformities, all well-perfused.  No peripheral edema Neuro: Nonfocal   Diagnostic Tests: ECHOCARDIOGRAM REPORT    Patient Name:   SIDONIA NUTTER Date of Exam: 12/10/2022  Medical Rec #:  [id number]        Height:       66.5 in  Accession #:    [id number]       Weight:       177.0 lb  Date of Birth:  1956-07-16        BSA:          1.909 m  Patient Age:    67 years         BP:           147/102 mmHg  Patient Gender: F                HR:           55 bpm.  Exam Location:  [address]   Procedure: 2D Echo,  Cardiac Doppler and Color Doppler   Indications:    1 year S/P TAVR evaluation V43.3    History:        Patient has prior history of Echocardiogram examinations,  most                 recent 01/01/2022. Risk Factors:Hypertension and Diabetes.  Septal                 Repair:PFO Closure on 01/26/2012.                  Aortic Valve: 23 mm CoreValve-Evolut Pro prosthetic,  stented                 (TAVR) valve is present in the aortic position. Procedure  Date:                 12/01/2021.    Sonographer:    Thurman Coyer RDCS  Referring Phys: [phone] JILL D MCDANIEL   IMPRESSIONS     1. No significant change compared to 01/01/22.   2. Left ventricular ejection fraction, by estimation, is 60 to 65%. The  left ventricle has normal function. The left ventricle has no regional  wall motion abnormalities. Left ventricular diastolic parameters are  consistent with Grade I diastolic  dysfunction (impaired relaxation).   3. Right ventricular systolic function is normal. The right ventricular  size is normal. There is normal pulmonary artery systolic pressure.   4. The mitral valve is normal in structure. Mild mitral valve  regurgitation. No evidence of mitral stenosis.   5. The aortic valve has been repaired/replaced. Aortic valve  regurgitation is not visualized. No aortic stenosis is present. There is a  23 mm CoreValve-Evolut Pro prosthetic (TAVR) valve present in  the aortic  position. Procedure Date: 12/01/2021.   6. Aortic dilatation noted. There is mild dilatation of the ascending  aorta, measuring 43 mm.   7. The inferior vena cava is normal in size with greater than 50%  respiratory variability, suggesting right atrial pressure of 3 mmHg.   FINDINGS   Left Ventricle: Left ventricular ejection fraction, by estimation, is 60  to 65%. The left ventricle has normal function. The left ventricle has no  regional wall motion abnormalities. The left ventricular internal cavity  size was  normal in size. There is   no left ventricular hypertrophy. Left ventricular diastolic parameters  are consistent with Grade I diastolic dysfunction (impaired relaxation).   Right Ventricle: The right ventricular size is normal. Right ventricular  systolic function is normal. There is normal pulmonary artery systolic  pressure. The tricuspid regurgitant velocity is 2.09 m/s, and with an  assumed right atrial pressure of 3 mmHg,   the estimated right ventricular systolic pressure is 20.5 mmHg.   Left Atrium: Left atrial size was normal in size.   Right Atrium: Right atrial size was normal in size.   Pericardium: There is no evidence of pericardial effusion.   Mitral Valve: The mitral valve is normal in structure. Mild mitral valve  regurgitation. No evidence of mitral valve stenosis.   Tricuspid Valve: The tricuspid valve is normal in structure. Tricuspid  valve regurgitation is mild . No evidence of tricuspid stenosis.   Aortic Valve: The aortic valve has been repaired/replaced. Aortic valve regurgitation is not visualized. No aortic stenosis is present. Aortic valve mean gradient measures 17.0 mmHg. Aortic valve peak gradient measures 29.4 mmHg. Aortic valve area, by  VTI measures 1.40 cm. There is a 23 mm CoreValve-Evolut Pro prosthetic, stented (TAVR) valve present in the aortic position. Procedure Date:  12/01/2021.   Pulmonic Valve: The pulmonic valve was normal in structure. Pulmonic valve  regurgitation is trivial. No evidence of pulmonic stenosis.   Aorta: Aortic dilatation noted. There is mild dilatation of the ascending aorta, measuring 43 mm.   Venous: The inferior vena cava is normal in size with greater than 50%  respiratory variability, suggesting right atrial pressure of 3 mmHg.   IAS/Shunts: No atrial level shunt detected by color flow Doppler.   Additional Comments: No significant change compared to 01/01/22.     LEFT VENTRICLE  PLAX 2D  LVIDd:         4.30 cm      Diastology  LVIDs:         2.55 cm     LV e' medial:    5.26 cm/s  LV PW:         1.10 cm     LV E/e' medial:  14.1  LV IVS:        1.10 cm     LV e' lateral:   9.55 cm/s  LVOT diam:     2.10 cm     LV E/e' lateral: 7.8  LV SV:         88  LV SV Index:   46  LVOT Area:     3.46 cm    LV Volumes (MOD)  LV vol d, MOD A2C: 57.3 ml  LV vol d, MOD A4C: 44.8 ml  LV vol s, MOD A2C: 18.8 ml  LV vol s, MOD A4C: 17.2 ml  LV SV MOD A2C:     38.5 ml  LV SV MOD A4C:     44.8 ml  LV SV MOD BP:      33.9 ml   RIGHT VENTRICLE  RV Basal diam:  3.20 cm  RV Mid diam:    2.40 cm  RV S prime:     10.50 cm/s  TAPSE (M-mode): 1.6 cm   LEFT ATRIUM             Index        RIGHT ATRIUM           Index  LA diam:        3.70 cm 1.94 cm/m   RA Area:     18.50 cm  LA Vol (A2C):   41.7 ml 21.84 ml/m  RA Volume:   56.60 ml  29.64 ml/m  LA Vol (A4C):   26.8 ml 14.04 ml/m  LA Biplane Vol: 35.0 ml 18.33 ml/m   AORTIC VALVE  AV Area (Vmax):    1.34 cm  AV Area (Vmean):   1.20 cm  AV Area (VTI):     1.40 cm  AV Vmax:           271.00 cm/s  AV Vmean:          196.000 cm/s  AV VTI:            0.632 m  AV Peak Grad:      29.4 mmHg  AV Mean Grad:      17.0 mmHg  LVOT Vmax:         105.00 cm/s  LVOT Vmean:        68.100 cm/s  LVOT VTI:          0.255 m  LVOT/AV VTI ratio: 0.40    AORTA  Ao Root diam: 2.90 cm  Ao Asc diam:  4.30 cm   MITRAL VALVE               TRICUSPID VALVE  MV Area (PHT): 2.50 cm    TR Peak grad:   17.5 mmHg  MV Decel Time: 303 msec    TR Vmax:        209.00 cm/s  MV E velocity: 74.40 cm/s  MV A velocity: 85.90 cm/s  SHUNTS  MV E/A ratio:  0.87        Systemic VTI:  0.26 m                             Systemic Diam: 2.10 cm   Olga Millers MD  Electronically signed by Olga Millers MD  Signature Date/Time: 12/10/2022/12:33:28 PM    Final      CLINICAL DATA:  67 year old female with history of severe aortic stenosis. Preprocedural study prior to potential  transcatheter aortic valve replacement (TAVR) procedure.   EXAM: CT ANGIOGRAPHY CHEST, ABDOMEN AND PELVIS  10/15/2021   TECHNIQUE: Multidetector CT imaging through the chest, abdomen and pelvis was performed using the standard protocol during bolus administration of intravenous contrast. Multiplanar reconstructed images and MIPs were obtained and reviewed to evaluate the vascular anatomy.   CONTRAST:  95mL OMNIPAQUE IOHEXOL 350 MG/ML SOLN   COMPARISON:  Cardiac CT 01/11/2012. CT of the abdomen and pelvis 01/11/2012.   FINDINGS: CTA CHEST FINDINGS   Cardiovascular: Heart size is normal. There is no significant pericardial fluid, thickening or pericardial calcification. There is aortic atherosclerosis, as well as atherosclerosis of the great vessels of the mediastinum and the coronary arteries, including calcified atherosclerotic plaque in the left anterior descending and left circumflex  coronary arteries. Aneurysmal dilatation of the ascending thoracic aorta which measures up to 4.6 cm in diameter. Status post median sternotomy for aortic valve replacement with what appears to be a stented bioprosthesis. There is some calcification of the bioprosthetic valve cusps.   Mediastinum/Lymph Nodes: No pathologically enlarged mediastinal or hilar lymph nodes. Hilar esophagus is unremarkable in appearance. No axillary lymphadenopathy.   Lungs/Pleura: Mild paraseptal emphysema. No acute consolidative airspace disease. No pleural effusions. No suspicious appearing pulmonary nodules or masses are noted.   Musculoskeletal/Soft Tissues: Median sternotomy wires. There are no aggressive appearing lytic or blastic lesions noted in the visualized portions of the skeleton.   CTA ABDOMEN AND PELVIS FINDINGS   Hepatobiliary: Diffuse low attenuation throughout the hepatic parenchyma, indicative of a background of hepatic steatosis. No suspicious cystic or solid hepatic lesions. No intra or  extrahepatic biliary ductal dilatation. Gallbladder is normal in appearance.   Pancreas: No pancreatic mass. No pancreatic ductal dilatation. No pancreatic or peripancreatic fluid collections or inflammatory changes.   Spleen: Unremarkable.   Adrenals/Urinary Tract: Bilateral kidneys and bilateral adrenal glands are normal in appearance. No hydroureteronephrosis. Urinary bladder is normal in appearance.   Stomach/Bowel: The appearance of the stomach is normal. There is no pathologic dilatation of small bowel or colon. Normal appendix.   Vascular/Lymphatic: Aortic atherosclerosis, without evidence of aneurysm or dissection in the abdominal or pelvic vasculature. Vascular findings and measurements pertinent to potential TAVR procedure, as detailed below. No lymphadenopathy noted in the abdomen or pelvis.   Reproductive: Status post hysterectomy. Ovaries are not confidently identified may be surgically absent or atrophic.   Other: No significant volume of ascites.  No pneumoperitoneum.   Musculoskeletal: There are no aggressive appearing lytic or blastic lesions noted in the visualized portions of the skeleton.   VASCULAR MEASUREMENTS PERTINENT TO TAVR:   AORTA:   Minimal Aortic Diameter-17 x 15 mm   Severity of Aortic Calcification-mild   RIGHT PELVIS:   Right Common Iliac Artery -   Minimal Diameter-7.1 x 8.6 mm   Tortuosity-mild   Calcification-moderate   Right External Iliac Artery -   Minimal Diameter-6.8 x 6.3 mm   Tortuosity-mild   Calcification-mild   Right Common Femoral Artery -   Minimal Diameter-6.6 x 6.6 mm   Tortuosity-mild   Calcification-none   LEFT PELVIS:   Left Common Iliac Artery -   Minimal Diameter-9.3 x 9.0 mm   Tortuosity-moderate   Calcification-moderate   Left External Iliac Artery -   Minimal Diameter-6.9 x 6.1 mm   Tortuosity-moderate   Calcification-none   Left Common Femoral Artery -   Minimal Diameter-6.7  x 7.1 mm   Tortuosity-mild   Calcification-mild   Review of the MIP images confirms the above findings.   IMPRESSION: 1. Vascular findings and measurements pertinent to potential TAVR procedure, as detailed above. 2. Status post aortic valve replacement with stented bioprosthesis which demonstrates calcifications of the valve cusps, compatible with reported clinical history of severe aortic stenosis. 3. Aortic atherosclerosis, in addition to 2 vessel coronary artery disease. There is also aneurysmal dilatation of the ascending thoracic aorta which measures up to 4.6 cm in diameter. Ascending thoracic aortic aneurysm. Recommend semi-annual imaging followup by CTA or MRA and referral to cardiothoracic surgery if not already obtained. This recommendation follows 2010 ACCF/AHA/AATS/ACR/ASA/SCA/SCAI/SIR/STS/SVM Guidelines for the Diagnosis and Management of Patients With Thoracic Aortic Disease. Circulation. 2010; 121: X914-N829. Aortic aneurysm NOS (ICD10-I71.9). 4. Hepatic steatosis. 5. Additional incidental findings, as above.     Electronically  Signed   By: Trudie Reed M.D.   On: 10/15/2021 12:09   Impression / Plan:  Ms. Gilman Buttner is a pleasant 67 year old female with the above described past medical history, history of aortic stenosis status post TAVR in 2013 and subsequent valve in valve TAVR in 2023.  She has a known thoracic aortic aneurysm 4.6 cm in 2022 and more recently 4.7 cm on CTA obtained about a month ago. We discussed the indications for surgical intervention, the recommendations for surveillance, and the recommended risk modifications presence of thoracic aortic aneurysms.  She is aware that Dr. Laneta Simmers felt that the most appropriate procedure from a surgeon standpoint and 2022 was to proceed with redo sternotomy, surgical aortic valve replacement and repair of the thoracic aortic aneurysm at the same time.  He also did not feel that she will was a candidate for  those procedures since she does not feel she can consent to excepting blood products.  She asked if we can refer her to a surgeon who would be willing to repair the thoracic aortic aneurysm should it become necessary.  I told her I wanted discuss this with Dr. Laneta Simmers and will contact her with his recommendations. She would like to continue with surveillance as recommended.  Will plan to see her back with a CTA in February 2025 (6 months after the previous CTA).       Leary Roca, PA-C [facility] [phone]

## 2023-09-15 ENCOUNTER — Other Ambulatory Visit: Payer: Self-pay | Admitting: Cardiology

## 2023-09-26 DIAGNOSIS — Z1231 Encounter for screening mammogram for malignant neoplasm of breast: Secondary | ICD-10-CM | POA: Diagnosis not present

## 2023-09-28 DIAGNOSIS — J00 Acute nasopharyngitis [common cold]: Secondary | ICD-10-CM | POA: Diagnosis not present

## 2023-10-07 DIAGNOSIS — Z23 Encounter for immunization: Secondary | ICD-10-CM | POA: Diagnosis not present

## 2023-10-25 DIAGNOSIS — I7121 Aneurysm of the ascending aorta, without rupture: Secondary | ICD-10-CM | POA: Diagnosis not present

## 2023-10-25 DIAGNOSIS — I5032 Chronic diastolic (congestive) heart failure: Secondary | ICD-10-CM | POA: Diagnosis not present

## 2023-11-10 ENCOUNTER — Other Ambulatory Visit: Payer: Self-pay | Admitting: Thoracic Surgery (Cardiothoracic Vascular Surgery)

## 2023-11-10 DIAGNOSIS — I7121 Aneurysm of the ascending aorta, without rupture: Secondary | ICD-10-CM

## 2023-11-24 NOTE — Progress Notes (Signed)
 [address]       [place] [address]             [phone]    Courtney Harvey [id number] 05-20-1956  History of Present Illness:  Courtney Harvey is a 68 year old female Jehovah's Witness known to our practice with prior history of severe aortic stenosis associated with a bicuspid aortic valve, hypertension, type 2 diabetes mellitus, and prior 40 pack-year history of cigarette smoking.  She is status post aortic valve replacement and closure of a PFO by Dr. Dusty in 2013.  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 During the course of being evaluated by cardiology for these symptoms, she was also discovered to have a 4.6cm fusiform thoracic aortic aneurysm on chest CTA. Given her personal conviction to accept no blood products, it was decided that she would not be a candidate for re-do aortic valve replacement and repair of the thoracic aneurysm when Dr. Lucas saw her in 2022.  She went on to have valve-in-valve TAVR procedure in January 2023 by Dr. Lucas and Dr. Wonda with a good result.  She presents today for 6 month follow up of her ascending aortic aneurysm.  The patient continues to do well.  She continues to not smoke and has been smoke free for about 10 years.  She occasionally uses Nicotine  gum for cravings.  She states she was recently diagnosed with stage 3 kidney disease.  She occasionally has some shortness of breath and just rests when this occurs.  She does ask that should she need surgery in the future if their is a location that can do the surgery despite her not taking blood products.  I explained the increased risk associated with this type of surgery and reason why blood transfusion may be required.  However, I can not speak to if [facility] would be willing to perform procedure.  I did re-iterate that currently her aneurysm is below surgical threshold.  Current  Outpatient Medications on File Prior to Visit  Medication Sig Dispense Refill   acetaminophen  (TYLENOL ) 500 MG tablet Take 500-1,000 mg by mouth every 6 (six) hours as needed for moderate pain.     amoxicillin  (AMOXIL ) 500 MG tablet Take 4 tablets by mouth 1 hour prior to dental procedures and cleanings 12 tablet 6   aspirin  EC 81 MG tablet Take 81 mg by mouth daily. Swallow whole.     cetirizine (ZYRTEC) 10 MG tablet Take 10 mg by mouth in the morning.     fluticasone (FLONASE) 50 MCG/ACT nasal spray Place 1 spray into both nostrils daily as needed for allergies or rhinitis.     furosemide  (LASIX ) 20 MG tablet Take 1 tablet (20 mg total) by mouth as needed for fluid or edema. 90 tablet 3   metFORMIN (GLUCOPHAGE-XR) 500 MG 24 hr tablet Take 500 mg by mouth daily before breakfast.     metoprolol  tartrate (LOPRESSOR ) 25 MG tablet Take 1 tablet by mouth twice daily 180 tablet 1   nicotine  polacrilex (NICORETTE) 2 MG gum Take 2 mg by mouth as needed for smoking cessation.     Current Facility-Administered Medications on File Prior to Visit  Medication Dose Route Frequency Provider Last Rate Last Admin   sodium chloride  flush (NS) 0.9 % injection 3 mL  3 mL Intravenous Q12H Debera Jayson MATSU, MD  LMP  (LMP Unknown)   Physical Exam  BP (!) 159/77 (BP Location: Left Arm, Patient Position: Sitting, Cuff Size: Normal)   Pulse 65   Resp 20   Ht 5' 6.5 (1.689 m)   Wt 178 lb (80.7 kg)   LMP  (LMP Unknown)   SpO2 95% Comment: RA  BMI 28.30 kg/m   Gen: NAD Heart; RRR, + blowing murmur Lungs: CTA Ext: no edema Neuro: grossly intact  CTA Results: Pending  A/P:  S/P AVR, closure PFO by Dr. Dusty 01/26/2012 S/P Valve in Valve TAVR by Dr. Wonda in January 2023 Ascending Aortic Aneurysm- measuring 4.6 cm last scan.. repeat scan performed today is not officially read.. will contact patient with results.. However being Jehovah's Witness would be extremely high risk for redo sternotomy  and surgical intervention and will likely not be a candidate HTN- elevated today, patient compliant on all medications.. per primary DM  Continue surveillance, RTC in 6 months with repeat CTA chest..   Risk Modification:  Statin:  No  Smoking cessation instruction/counseling given:  commended patient for quitting and reviewed strategies for preventing relapses  Patient was counseled on importance of Blood Pressure Control.  Despite Medical intervention if the patient notices persistently elevated blood pressure readings.  They are instructed to contact their Primary Care Physician  Please avoid use of Fluoroquinolones as this can potentially increase your risk of Aortic Rupture and/or Dissection  Patient educated on signs and symptoms of Aortic Dissection, handout also provided in AVS  Rhyder Bratz, PA-C 11/24/23

## 2023-11-24 NOTE — Patient Instructions (Signed)
 Make every effort to maintain a heart-healthy lifestyle with regular physical exercise and adherence to a low-fat, low-carbohydrate diet.  Continue to seek regular follow-up appointments with your primary care physician and/or cardiologist.   AVOID FLOUROQUINOLONES AS THESE CLASS OF ANTIBIOTICS CAN INCREASE YOUR RISK OF DISSECTION

## 2023-12-06 ENCOUNTER — Ambulatory Visit: Payer: Medicare Other | Admitting: Physician Assistant

## 2023-12-06 ENCOUNTER — Ambulatory Visit
Admission: RE | Admit: 2023-12-06 | Discharge: 2023-12-06 | Disposition: A | Discharge status: Home / Self Care | Payer: Medicare Other | Source: Ambulatory Visit | Attending: Thoracic Surgery (Cardiothoracic Vascular Surgery) | Admitting: Thoracic Surgery (Cardiothoracic Vascular Surgery)

## 2023-12-06 VITALS — BP 159/77 | HR 65 | Resp 20 | Ht 66.5 in | Wt 178.0 lb

## 2023-12-06 DIAGNOSIS — I7121 Aneurysm of the ascending aorta, without rupture: Secondary | ICD-10-CM

## 2023-12-06 MED ORDER — IOPAMIDOL (ISOVUE-370) INJECTION 76%: 75.0000 mL | Freq: Once | INTRAVENOUS | Status: DC | PRN

## 2023-12-06 MED ORDER — IOPAMIDOL (ISOVUE-370) INJECTION 76%
75.0000 mL | Freq: Once | INTRAVENOUS | Status: AC | PRN
  Administered 2023-12-06: 75 mL via INTRAVENOUS

## 2023-12-29 ENCOUNTER — Other Ambulatory Visit: Payer: Self-pay | Admitting: Cardiology

## 2023-12-29 ENCOUNTER — Ambulatory Visit: Payer: Medicare Other | Attending: Cardiology

## 2023-12-29 DIAGNOSIS — Z952 Presence of prosthetic heart valve: Secondary | ICD-10-CM

## 2023-12-29 DIAGNOSIS — I10 Essential (primary) hypertension: Secondary | ICD-10-CM

## 2023-12-29 DIAGNOSIS — I5032 Chronic diastolic (congestive) heart failure: Secondary | ICD-10-CM | POA: Diagnosis not present

## 2023-12-30 LAB — ECHOCARDIOGRAM COMPLETE
AR max vel: 1.34 cm2
AV Area VTI: 1.28 cm2
AV Area mean vel: 1.11 cm2
AV Mean grad: 21 mm[Hg]
AV Peak grad: 34.8 mm[Hg]
Ao pk vel: 2.95 m/s
Area-P 1/2: 2.08 cm2
Calc EF: 66.8 %
MV VTI: 2.13 cm2
S' Lateral: 2.3 cm
Single Plane A2C EF: 72.1 %
Single Plane A4C EF: 57.5 %

## 2024-01-11 ENCOUNTER — Ambulatory Visit: Payer: Medicare Other | Attending: Cardiology | Admitting: Cardiology

## 2024-01-11 ENCOUNTER — Encounter: Payer: Self-pay | Admitting: Cardiology

## 2024-01-11 VITALS — BP 110/82 | HR 67 | Ht 66.5 in | Wt 181.0 lb

## 2024-01-11 DIAGNOSIS — I10 Essential (primary) hypertension: Secondary | ICD-10-CM

## 2024-01-11 DIAGNOSIS — Z952 Presence of prosthetic heart valve: Secondary | ICD-10-CM | POA: Diagnosis not present

## 2024-01-11 NOTE — Progress Notes (Signed)
 Cardiology Office Note  Date: 01/11/2024   ID: MLISSA TAMAYO, DOB 08/19/1956, MRN [id number]  History of Present Illness: Courtney Harvey is a 68 y.o. female last seen in May 2024.  She is here for a routine visit.  She does not report any exertional chest pain, NYHA class II dyspnea.  No palpitations or syncope.  We went over her medications.  She continues on aspirin 81 mg daily, has not had to take any Lasix.  Also remains on Lopressor 25 mg twice daily.  I reviewed her interval lab work which is noted below.  I reviewed her ECG today which shows sinus rhythm with PVC.  She had a recent follow-up echocardiogram as described below.  Physical Exam: VS:  BP 110/82 (BP Location: Right Arm, Patient Position: Sitting, Cuff Size: Normal)   Pulse 67   Ht 5' 6.5" (1.689 m)   Wt 181 lb (82.1 kg)   LMP  (LMP Unknown)   SpO2 96%   BMI 28.78 kg/m , BMI Body mass index is 28.78 kg/m.  Wt Readings from Last 3 Encounters:  01/11/24 181 lb (82.1 kg)  12/06/23 178 lb (80.7 kg)  08/04/23 178 lb (80.7 kg)    General: Patient appears comfortable at rest. HEENT: Conjunctiva and lids normal. Neck: Supple, no elevated JVP or carotid bruits. Lungs: Clear to auscultation, nonlabored breathing at rest. Cardiac: Regular rate and rhythm, no S3, 3/6 systolic murmur. Extremities: No pitting edema.  ECG:  An ECG dated 07/07/2022 was personally reviewed today and demonstrated:  Sinus rhythm.  Labwork:  August 2024: Hemoglobin 11.3, platelets 225, potassium 4.4, BUN 20, creatinine 1.06, AST 23, ALT 23, cholesterol 165, triglycerides 149, HDL 41, LDL 94, hemoglobin A1c 6.3%  Other Studies Reviewed Today:  Echocardiogram 12/29/2023:  1. Left ventricular ejection fraction, by estimation, is 55 to 60%. The  left ventricle has normal function. The left ventricle has no regional  wall motion abnormalities. Left ventricular diastolic parameters are  consistent with Grade I diastolic  dysfunction  (impaired relaxation). The global longitudinal strain is  indeterminate.   2. Right ventricular systolic function is normal. The right ventricular  size is normal. Tricuspid regurgitation signal is inadequate for assessing  PA pressure.   3. The mitral valve is normal in structure. Trivial mitral valve  regurgitation. No evidence of mitral stenosis.   4. 21 mm Magna Ease pericardial tissue valve in 2013, subsequently  followed by valve in valve Medtronic Evolut Pro-Plus 23 mm valve. Mean  gradient across valve 21 mmHg, within published normal. . The aortic valve  has been repaired/replaced. Aortic valve   regurgitation is not visualized. No aortic stenosis is present. There is  a valve present in the aortic position. Procedure Date: 12/01/2021. Aortic  valve mean gradient measures 21.0 mmHg.   5. Aortic dilatation noted. There is mild dilatation of the ascending  aorta, measuring 39 mm.   6. The inferior vena cava is normal in size with greater than 50%  respiratory variability, suggesting right atrial pressure of 3 mmHg.   Assessment and Plan:  1.  History of bicuspid aortic valve with stenosis status post 21 mm [facility] Ease pericardial AVR in 2013 and more recently valve in valve TAVR with 23 mm Evolut Pro+ in January 2023 for management of prosthetic stenosis.  Follow-up echocardiogram in February revealed LVEF 55 to 60%, valve in valve TAVR with mean gradient 21 mmHg up from 17 mmHg and no aortic regurgitation.  Dimensionless index  0.41.  She remains clinically stable, continue observation on aspirin 81 mg daily.  Plan to recheck echocardiogram in 1 year with clinical reassessment.   2.  History of PFO status postsurgical closure in 2013.  No obvious residual shunting evident by echocardiogram in February.   3.  Primary hypertension.  Blood pressure is normal today.  Continue Lopressor 25 mg twice daily.  Disposition:  Follow up  1 year.  Signed, Jonelle Sidle, M.D.,  F.A.C.C. [facility]

## 2024-01-11 NOTE — Patient Instructions (Signed)
 Medication Instructions:  Your physician recommends that you continue on your current medications as directed. Please refer to the Current Medication list given to you today.  Labwork: none  Testing/Procedures: Your physician has requested that you have an echocardiogram in 1 year just before your next visit. Echocardiography is a painless test that uses sound waves to create images of your heart. It provides your doctor with information about the size and shape of your heart and how well your heart's chambers and valves are working. This procedure takes approximately one hour. There are no restrictions for this procedure. Please do NOT wear cologne, perfume, aftershave, or lotions (deodorant is allowed). Please arrive 15 minutes prior to your appointment time.  Please note: We ask at that you not bring children with you during ultrasound (echo/ vascular) testing. Due to room size and safety concerns, children are not allowed in the ultrasound rooms during exams. Our front office staff cannot provide observation of children in our lobby area while testing is being conducted. An adult accompanying a patient to their appointment will only be allowed in the ultrasound room at the discretion of the ultrasound technician under special circumstances. We apologize for any inconvenience.  Follow-Up: Your physician recommends that you schedule a follow-up appointment in: 1 year. You will receive a reminder call in about 10 months reminding you to schedule your appointment. If you don't receive this call, please contact our office.  Any Other Special Instructions Will Be Listed Below (If Applicable).  If you need a refill on your cardiac medications before your next appointment, please call your pharmacy.

## 2024-03-24 DIAGNOSIS — R051 Acute cough: Secondary | ICD-10-CM | POA: Diagnosis not present

## 2024-03-24 DIAGNOSIS — J069 Acute upper respiratory infection, unspecified: Secondary | ICD-10-CM | POA: Diagnosis not present

## 2024-03-24 DIAGNOSIS — R070 Pain in throat: Secondary | ICD-10-CM | POA: Diagnosis not present

## 2024-04-10 ENCOUNTER — Other Ambulatory Visit: Payer: Self-pay | Admitting: *Deleted

## 2024-04-10 MED ORDER — METOPROLOL TARTRATE 25 MG PO TABS: 25.0000 mg | ORAL_TABLET | Freq: Two times a day (BID) | ORAL | 3 refills | Status: AC

## 2024-04-11 DIAGNOSIS — R030 Elevated blood-pressure reading, without diagnosis of hypertension: Secondary | ICD-10-CM | POA: Diagnosis not present

## 2024-04-11 DIAGNOSIS — L209 Atopic dermatitis, unspecified: Secondary | ICD-10-CM | POA: Diagnosis not present

## 2024-05-01 DIAGNOSIS — L218 Other seborrheic dermatitis: Secondary | ICD-10-CM | POA: Diagnosis not present

## 2024-05-01 DIAGNOSIS — L508 Other urticaria: Secondary | ICD-10-CM | POA: Diagnosis not present

## 2024-05-09 ENCOUNTER — Other Ambulatory Visit: Payer: Self-pay | Admitting: Surgery

## 2024-05-09 DIAGNOSIS — I7121 Aneurysm of the ascending aorta, without rupture: Secondary | ICD-10-CM

## 2024-06-05 ENCOUNTER — Ambulatory Visit (HOSPITAL_COMMUNITY)
Admission: RE | Admit: 2024-06-05 | Discharge: 2024-06-05 | Disposition: A | Discharge status: Home / Self Care | Source: Ambulatory Visit | Attending: Cardiology | Admitting: Cardiology

## 2024-06-05 DIAGNOSIS — J439 Emphysema, unspecified: Secondary | ICD-10-CM | POA: Insufficient documentation

## 2024-06-05 DIAGNOSIS — I7121 Aneurysm of the ascending aorta, without rupture: Secondary | ICD-10-CM | POA: Diagnosis not present

## 2024-06-05 DIAGNOSIS — I700 Atherosclerosis of aorta: Secondary | ICD-10-CM | POA: Diagnosis not present

## 2024-06-05 DIAGNOSIS — I2510 Atherosclerotic heart disease of native coronary artery without angina pectoris: Secondary | ICD-10-CM | POA: Insufficient documentation

## 2024-06-05 LAB — POCT I-STAT CREATININE: Creatinine, Ser: 1.1 mg/dL — ABNORMAL HIGH (ref 0.44–1.00)

## 2024-06-05 MED ORDER — IOHEXOL 350 MG/ML SOLN
75.0000 mL | Freq: Once | INTRAVENOUS | Status: AC | PRN
  Administered 2024-06-05: 75 mL via INTRAVENOUS

## 2024-06-11 NOTE — Progress Notes (Signed)
 [address] [place] [address]             [phone]            IJANAE MACAPAGAL [id number] 1956/09/26   History of Present Illness:  Ms. Shauntea Lok is a 68 year old female Jehovah's Witness known to our practice with prior history of severe aortic stenosis associated with a bicuspid aortic valve, hypertension, type 2 diabetes mellitus, and prior 40 pack-year history of cigarette smoking.  She is status post aortic valve replacement and closure of a PFO by Dr. Dusty in 2013.  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 During the course of being evaluated by cardiology for these symptoms, she was also discovered to have a 4.6cm fusiform thoracic aortic aneurysm on chest CTA. Given her personal conviction to accept no blood products, it was decided that she would not be a candidate for re-do aortic valve replacement and repair of the thoracic aneurysm when Dr. Lucas saw her in 2022. She went on to have valve-in-valve TAVR procedure in January 2023 by Dr. Lucas and Dr. Wonda with a good result.  She presents today for continued follow-up of ascending thoracic aortic aneurysm.  Aneurysm has measured in size ranging from 4.4 cm to 4.7 cm.  Most recent CTA of chest on 06/2024 measured aneurysm at 4.4 cm. She reports that she has been doing well.  Her blood pressure is controlled with current medication therapy.  She does not exercise regularly.  She denies chest pain, shortness of breath, lower extremity swelling.       Current Outpatient Medications on File Prior to Visit  Medication Sig Dispense Refill   acetaminophen  (TYLENOL ) 500 MG tablet Take 500-1,000 mg by mouth every 6 (six) hours as needed for moderate pain.     amoxicillin  (AMOXIL ) 500 MG tablet Take 4 tablets by mouth 1 hour prior to dental procedures and cleanings 12 tablet 6   aspirin  EC 81 MG  tablet Take 81 mg by mouth daily. Swallow whole.     cetirizine (ZYRTEC) 10 MG tablet Take 10 mg by mouth in the morning.     fluticasone (FLONASE) 50 MCG/ACT nasal spray Place 1 spray into both nostrils daily as needed for allergies or rhinitis.     furosemide  (LASIX ) 20 MG tablet Take 1 tablet (20 mg total) by mouth as needed for fluid or edema. 90 tablet 3   hydrOXYzine (VISTARIL) 25 MG capsule Take 25-50 mg by mouth at bedtime as needed.     ipratropium (ATROVENT) 0.03 % nasal spray Place into both nostrils.     metFORMIN (GLUCOPHAGE-XR) 500 MG 24 hr tablet Take 500 mg by mouth daily before breakfast.     metoprolol  tartrate (LOPRESSOR ) 25 MG tablet Take 1 tablet (25 mg total) by mouth 2 (two) times daily. 180 tablet 3   nicotine  polacrilex (NICORETTE) 2 MG gum Take 2 mg by mouth as needed for smoking cessation.     Current Facility-Administered Medications on File Prior to Visit  Medication Dose Route Frequency Provider Last Rate Last Admin   sodium chloride  flush (NS) 0.9 % injection 3 mL  3 mL Intravenous Q12H Debera Jayson MATSU, MD         ROS: Review of Systems  Respiratory: Negative.  Negative for cough and shortness of breath.   Cardiovascular: Negative.  Negative for chest pain and leg swelling.     BP (!) 142/84 (BP Location: Left Arm)   Pulse (!) 57   Resp 18   Ht 5' 6 (1.676 m)   Wt 178 lb (80.7 kg)   LMP  (LMP Unknown)   SpO2 95%   BMI 28.73 kg/m   Physical Exam Constitutional:      Appearance: Normal appearance.  HENT:     Head: Normocephalic and atraumatic.  Cardiovascular:     Rate and Rhythm: Normal rate and regular rhythm.     Heart sounds: S1 normal and S2 normal. Murmur heard.     Systolic murmur is present.  Musculoskeletal:     Cervical back: Normal range of motion.  Skin:    General: Skin is warm and dry.  Neurological:     General: No focal deficit present.     Mental Status: She is alert.       Imaging: CLINICAL DATA:  Follow-up  ascending thoracic aortic aneurysm.   EXAM: CT ANGIOGRAPHY CHEST WITH CONTRAST   TECHNIQUE: Multidetector CT imaging of the chest was performed using the standard protocol during bolus administration of intravenous contrast. Multiplanar CT image reconstructions and MIPs were obtained to evaluate the vascular anatomy.   RADIATION DOSE REDUCTION: This exam was performed according to the departmental dose-optimization program which includes automated exposure control, adjustment of the mA and/or kV according to patient size and/or use of iterative reconstruction technique.   CONTRAST:  75mL OMNIPAQUE  IOHEXOL  350 MG/ML SOLN   COMPARISON:  12/06/2023   FINDINGS: Cardiovascular: Stable TAVR. The previously demonstrated 4.5 cm ascending thoracic aortic aneurysm currently measures 4.4 cm in corresponding dimension on image number [id number]. The posterior aortic arch remains dilated, measuring 3.9 cm in maximum diameter, previously 4.0 cm. The descending thoracic aorta measures 3.4 cm in maximum diameter.   The heart remains normal in size. No pericardial effusion. Mild atheromatous calcifications, including the coronary arteries and aorta.   Mediastinum/Nodes: No enlarged mediastinal, hilar, or axillary lymph nodes. Thyroid gland, trachea, and esophagus demonstrate no significant findings.   Lungs/Pleura: Again demonstrated are biapical paraseptal bullous changes. No lung nodules or pleural fluid.   Upper Abdomen: Unremarkable.   Musculoskeletal: Mild thoracic spine degenerative changes.   Review of the MIP images confirms the above findings.   IMPRESSION: 1. Stable 4.4 cm ascending thoracic aortic aneurysm. Recommend annual imaging followup by CTA or MRA. This recommendation follows 2010 ACCF/AHA/AATS/ACR/ASA/SCA/SCAI/SIR/STS/SVM Guidelines for the Diagnosis and Management of Patients with Thoracic Aortic Disease. Circulation. 2010; 121: Z733-z630. Aortic aneurysm NOS  (ICD10-I71.9) 2. Stable Mild aortic arch dilatation posteriorly, measuring 3.9 cm today. 3. 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 4. Stable biapical paraseptal bullous emphysema.   Aortic Atherosclerosis (ICD10-I70.0) and Emphysema (ICD10-J43.9).     Electronically Signed   By: Elspeth Bathe M.D.   On: 06/05/2024 15:00     A/P: Aneurysm of ascending aorta without rupture (HCC) -4.4 cm ascending thoracic aortic aneurysm on CTA of chest. We discussed the natural history and and risk factors for growth of ascending aortic aneurysms. Discussed recommendations to minimize the risk of further expansion or dissection including careful blood pressure control, avoidance of contact sports and heavy lifting, attention to lipid management.  We covered the importance of continued smoking cessation.  The patient does not yet meet surgical criteria of >5.5cm. The patient is aware of signs and symptoms of aortic dissection and when to present to the emergency department   -Follow-up in  6 months with CTA of chest for continued surveillance   Risk Modification:  Statin:  not currently prescribed  Smoking cessation instruction/counseling given:  commended patient for quitting and reviewed strategies for preventing relapses  Patient was counseled on importance of Blood Pressure Control  They are instructed to contact their Primary Care Physician if they start to have blood pressure readings over 130s/90s. Do not ever stop blood pressure medications on your own, unless instructed by healthcare professional.  Please avoid use of Fluoroquinolones as this can potentially increase your risk of Aortic Rupture and/or Dissection  Patient educated on signs and symptoms of Aortic Dissection, handout also provided in AVS  Manuelita CHRISTELLA Rough, PA-C 06/12/24

## 2024-06-12 ENCOUNTER — Ambulatory Visit

## 2024-06-12 VITALS — BP 142/84 | HR 57 | Resp 18 | Ht 66.0 in | Wt 178.0 lb

## 2024-06-12 DIAGNOSIS — I7121 Aneurysm of the ascending aorta, without rupture: Secondary | ICD-10-CM

## 2024-06-12 NOTE — Patient Instructions (Signed)

## 2024-09-18 ENCOUNTER — Encounter: Payer: Self-pay | Admitting: Cardiology

## 2024-09-18 ENCOUNTER — Other Ambulatory Visit: Payer: Self-pay | Admitting: *Deleted

## 2024-09-18 DIAGNOSIS — Z952 Presence of prosthetic heart valve: Secondary | ICD-10-CM

## 2024-09-18 NOTE — Telephone Encounter (Signed)
 error

## 2024-11-12 ENCOUNTER — Other Ambulatory Visit: Payer: Self-pay

## 2024-11-12 DIAGNOSIS — I7121 Aneurysm of the ascending aorta, without rupture: Secondary | ICD-10-CM

## 2024-12-03 ENCOUNTER — Ambulatory Visit (HOSPITAL_COMMUNITY)

## 2024-12-07 ENCOUNTER — Ambulatory Visit (HOSPITAL_COMMUNITY): Admission: RE | Admit: 2024-12-07

## 2024-12-07 DIAGNOSIS — Z01812 Encounter for preprocedural laboratory examination: Secondary | ICD-10-CM

## 2024-12-07 DIAGNOSIS — I7121 Aneurysm of the ascending aorta, without rupture: Secondary | ICD-10-CM

## 2024-12-07 LAB — POCT I-STAT CREATININE: Creatinine, Ser: 1.1 mg/dL — ABNORMAL HIGH (ref 0.44–1.00)

## 2024-12-07 MED ORDER — IOHEXOL 350 MG/ML SOLN
75.0000 mL | Freq: Once | INTRAVENOUS | Status: AC | PRN
  Administered 2024-12-07: 75 mL via INTRAVENOUS

## 2024-12-17 ENCOUNTER — Ambulatory Visit

## 2024-12-27 ENCOUNTER — Ambulatory Visit

## 2025-01-08 ENCOUNTER — Ambulatory Visit: Admitting: Cardiology
